# Patient Record
Sex: Female | Born: 1935 | Race: White | Hispanic: No | Marital: Married | Smoking: Former smoker
Health system: Southern US, Community
[De-identification: ages and names within clinical notes are randomized; demographics above are authoritative.]

## PROBLEM LIST (undated history)

## (undated) DIAGNOSIS — I10 Essential (primary) hypertension: Secondary | ICD-10-CM

## (undated) DIAGNOSIS — F0390 Unspecified dementia without behavioral disturbance: Secondary | ICD-10-CM

## (undated) DIAGNOSIS — G309 Alzheimer's disease, unspecified: Secondary | ICD-10-CM

## (undated) DIAGNOSIS — R569 Unspecified convulsions: Secondary | ICD-10-CM

## (undated) DIAGNOSIS — I629 Nontraumatic intracranial hemorrhage, unspecified: Secondary | ICD-10-CM

## (undated) DIAGNOSIS — F0280 Dementia in other diseases classified elsewhere without behavioral disturbance: Secondary | ICD-10-CM

## (undated) DIAGNOSIS — E7800 Pure hypercholesterolemia, unspecified: Secondary | ICD-10-CM

## (undated) DIAGNOSIS — E079 Disorder of thyroid, unspecified: Secondary | ICD-10-CM

## (undated) DIAGNOSIS — K219 Gastro-esophageal reflux disease without esophagitis: Secondary | ICD-10-CM

## (undated) DIAGNOSIS — F419 Anxiety disorder, unspecified: Secondary | ICD-10-CM

## (undated) DIAGNOSIS — E785 Hyperlipidemia, unspecified: Secondary | ICD-10-CM

## (undated) HISTORY — PX: CHOLECYSTECTOMY: SHX55

## (undated) HISTORY — PX: ABDOMINAL HYSTERECTOMY: SHX81

---

## 2000-08-20 ENCOUNTER — Other Ambulatory Visit: Admission: RE | Admit: 2000-08-20 | Discharge: 2000-08-20 | Payer: Self-pay | Admitting: Family Medicine

## 2000-10-08 ENCOUNTER — Encounter (INDEPENDENT_AMBULATORY_CARE_PROVIDER_SITE_OTHER): Payer: Self-pay | Admitting: Specialist

## 2000-10-08 ENCOUNTER — Ambulatory Visit (HOSPITAL_COMMUNITY): Admission: RE | Admit: 2000-10-08 | Discharge: 2000-10-08 | Payer: Self-pay | Admitting: Gastroenterology

## 2002-10-09 ENCOUNTER — Encounter: Payer: Self-pay | Admitting: Gastroenterology

## 2002-10-09 ENCOUNTER — Encounter: Admission: RE | Admit: 2002-10-09 | Discharge: 2002-10-09 | Payer: Self-pay | Admitting: Gastroenterology

## 2003-08-09 ENCOUNTER — Encounter: Admission: RE | Admit: 2003-08-09 | Discharge: 2003-08-16 | Payer: Self-pay | Admitting: Family Medicine

## 2003-08-20 ENCOUNTER — Inpatient Hospital Stay (HOSPITAL_COMMUNITY): Admission: AD | Admit: 2003-08-20 | Discharge: 2003-08-31 | Payer: Self-pay | Admitting: Orthopedic Surgery

## 2003-08-27 ENCOUNTER — Encounter: Payer: Self-pay | Admitting: Orthopedic Surgery

## 2003-09-21 ENCOUNTER — Encounter: Admission: RE | Admit: 2003-09-21 | Discharge: 2003-09-21 | Payer: Self-pay | Admitting: Internal Medicine

## 2003-11-01 ENCOUNTER — Other Ambulatory Visit: Admission: RE | Admit: 2003-11-01 | Discharge: 2003-11-01 | Payer: Self-pay | Admitting: Family Medicine

## 2004-01-19 ENCOUNTER — Ambulatory Visit (HOSPITAL_COMMUNITY): Admission: RE | Admit: 2004-01-19 | Discharge: 2004-01-19 | Payer: Self-pay | Admitting: Family Medicine

## 2004-09-14 ENCOUNTER — Encounter: Admission: RE | Admit: 2004-09-14 | Discharge: 2004-09-14 | Payer: Self-pay | Admitting: Family Medicine

## 2004-09-18 ENCOUNTER — Emergency Department (HOSPITAL_COMMUNITY): Admission: EM | Admit: 2004-09-18 | Discharge: 2004-09-18 | Payer: Self-pay | Admitting: Emergency Medicine

## 2005-02-14 ENCOUNTER — Encounter (INDEPENDENT_AMBULATORY_CARE_PROVIDER_SITE_OTHER): Payer: Self-pay | Admitting: Specialist

## 2005-02-14 ENCOUNTER — Ambulatory Visit (HOSPITAL_COMMUNITY): Admission: RE | Admit: 2005-02-14 | Discharge: 2005-02-14 | Payer: Self-pay | Admitting: Gastroenterology

## 2005-07-09 ENCOUNTER — Ambulatory Visit (HOSPITAL_COMMUNITY): Admission: RE | Admit: 2005-07-09 | Discharge: 2005-07-09 | Payer: Self-pay | Admitting: Family Medicine

## 2006-01-24 ENCOUNTER — Ambulatory Visit (HOSPITAL_COMMUNITY): Admission: RE | Admit: 2006-01-24 | Discharge: 2006-01-24 | Payer: Self-pay | Admitting: Family Medicine

## 2006-01-26 ENCOUNTER — Ambulatory Visit (HOSPITAL_COMMUNITY): Admission: RE | Admit: 2006-01-26 | Discharge: 2006-01-26 | Payer: Self-pay | Admitting: Family Medicine

## 2006-04-17 ENCOUNTER — Encounter: Admission: RE | Admit: 2006-04-17 | Discharge: 2006-04-17 | Payer: Self-pay | Admitting: Neurosurgery

## 2006-05-01 ENCOUNTER — Encounter: Admission: RE | Admit: 2006-05-01 | Discharge: 2006-05-01 | Payer: Self-pay | Admitting: Neurosurgery

## 2006-05-15 ENCOUNTER — Encounter: Admission: RE | Admit: 2006-05-15 | Discharge: 2006-05-15 | Payer: Self-pay | Admitting: Neurosurgery

## 2006-06-20 ENCOUNTER — Ambulatory Visit (HOSPITAL_COMMUNITY): Admission: RE | Admit: 2006-06-20 | Discharge: 2006-06-20 | Payer: Self-pay | Admitting: Neurosurgery

## 2006-07-30 ENCOUNTER — Inpatient Hospital Stay (HOSPITAL_COMMUNITY): Admission: RE | Admit: 2006-07-30 | Discharge: 2006-08-07 | Payer: Self-pay | Admitting: Neurosurgery

## 2006-08-26 ENCOUNTER — Encounter: Admission: RE | Admit: 2006-08-26 | Discharge: 2006-09-30 | Payer: Self-pay | Admitting: Neurosurgery

## 2007-03-31 ENCOUNTER — Encounter: Admission: RE | Admit: 2007-03-31 | Discharge: 2007-04-08 | Payer: Self-pay | Admitting: Unknown Physician Specialty

## 2007-07-17 ENCOUNTER — Ambulatory Visit (HOSPITAL_COMMUNITY): Admission: RE | Admit: 2007-07-17 | Discharge: 2007-07-17 | Payer: Self-pay | Admitting: Family Medicine

## 2007-08-27 ENCOUNTER — Encounter: Admission: RE | Admit: 2007-08-27 | Discharge: 2007-08-27 | Payer: Self-pay | Admitting: Gastroenterology

## 2009-01-08 ENCOUNTER — Inpatient Hospital Stay (HOSPITAL_COMMUNITY): Admission: EM | Admit: 2009-01-08 | Discharge: 2009-01-17 | Payer: Self-pay | Admitting: Family Medicine

## 2009-01-10 ENCOUNTER — Encounter (INDEPENDENT_AMBULATORY_CARE_PROVIDER_SITE_OTHER): Payer: Self-pay | Admitting: Internal Medicine

## 2009-01-10 ENCOUNTER — Ambulatory Visit: Payer: Self-pay | Admitting: *Deleted

## 2009-01-31 ENCOUNTER — Encounter: Admission: RE | Admit: 2009-01-31 | Discharge: 2009-05-01 | Payer: Self-pay | Admitting: Obstetrics and Gynecology

## 2010-08-16 ENCOUNTER — Inpatient Hospital Stay (HOSPITAL_COMMUNITY): Admission: EM | Admit: 2010-08-16 | Discharge: 2010-08-18 | Payer: Self-pay | Admitting: Family Medicine

## 2010-08-17 ENCOUNTER — Ambulatory Visit: Payer: Self-pay | Admitting: Vascular Surgery

## 2010-08-17 ENCOUNTER — Ambulatory Visit: Payer: Self-pay | Admitting: Cardiology

## 2010-08-17 ENCOUNTER — Encounter (INDEPENDENT_AMBULATORY_CARE_PROVIDER_SITE_OTHER): Payer: Self-pay | Admitting: Emergency Medicine

## 2010-08-24 ENCOUNTER — Ambulatory Visit (HOSPITAL_COMMUNITY): Payer: Self-pay | Admitting: Psychology

## 2010-09-01 ENCOUNTER — Ambulatory Visit (HOSPITAL_COMMUNITY): Payer: Self-pay | Admitting: Psychology

## 2010-09-08 ENCOUNTER — Ambulatory Visit (HOSPITAL_COMMUNITY): Payer: Self-pay | Admitting: Psychology

## 2010-09-15 ENCOUNTER — Ambulatory Visit (HOSPITAL_COMMUNITY): Payer: Self-pay | Admitting: Psychiatry

## 2010-09-29 ENCOUNTER — Ambulatory Visit (HOSPITAL_COMMUNITY): Payer: Self-pay | Admitting: Psychology

## 2010-10-13 ENCOUNTER — Ambulatory Visit (HOSPITAL_COMMUNITY): Payer: Self-pay | Admitting: Psychiatry

## 2010-12-19 ENCOUNTER — Encounter: Payer: Self-pay | Admitting: Family Medicine

## 2010-12-19 ENCOUNTER — Ambulatory Visit: Payer: Self-pay | Admitting: Family Medicine

## 2010-12-19 DIAGNOSIS — E039 Hypothyroidism, unspecified: Secondary | ICD-10-CM

## 2010-12-19 DIAGNOSIS — R51 Headache: Secondary | ICD-10-CM

## 2010-12-19 DIAGNOSIS — E119 Type 2 diabetes mellitus without complications: Secondary | ICD-10-CM

## 2010-12-19 DIAGNOSIS — R5383 Other fatigue: Secondary | ICD-10-CM

## 2010-12-19 DIAGNOSIS — R42 Dizziness and giddiness: Secondary | ICD-10-CM

## 2010-12-19 DIAGNOSIS — R5381 Other malaise: Secondary | ICD-10-CM

## 2010-12-19 DIAGNOSIS — R519 Headache, unspecified: Secondary | ICD-10-CM | POA: Insufficient documentation

## 2010-12-19 DIAGNOSIS — F068 Other specified mental disorders due to known physiological condition: Secondary | ICD-10-CM | POA: Insufficient documentation

## 2010-12-19 DIAGNOSIS — I10 Essential (primary) hypertension: Secondary | ICD-10-CM

## 2010-12-19 DIAGNOSIS — F411 Generalized anxiety disorder: Secondary | ICD-10-CM | POA: Insufficient documentation

## 2010-12-19 LAB — CONVERTED CEMR LAB
AST: 28 units/L (ref 0–37)
Alkaline Phosphatase: 84 units/L (ref 39–117)
Bilirubin Urine: NEGATIVE
Blood in Urine, dipstick: NEGATIVE
CO2: 20 meq/L (ref 19–32)
Chloride: 106 meq/L (ref 96–112)
Creatinine, Ser: 0.71 mg/dL (ref 0.40–1.20)
Ketones, urine, test strip: NEGATIVE
Potassium: 4 meq/L (ref 3.5–5.3)
Protein, U semiquant: NEGATIVE
Sodium: 140 meq/L (ref 135–145)
Total Bilirubin: 0.5 mg/dL (ref 0.3–1.2)

## 2010-12-20 ENCOUNTER — Encounter: Payer: Self-pay | Admitting: Family Medicine

## 2010-12-21 ENCOUNTER — Telehealth (INDEPENDENT_AMBULATORY_CARE_PROVIDER_SITE_OTHER): Payer: Self-pay | Admitting: *Deleted

## 2011-01-21 ENCOUNTER — Encounter: Payer: Self-pay | Admitting: Neurosurgery

## 2011-02-01 NOTE — Letter (Signed)
Summary: RELEASE OF MEDICAL RECORDS FORM  RELEASE OF MEDICAL RECORDS FORM   Imported By: Shelbie Proctor 12/20/2010 14:34:53  _____________________________________________________________________  External Attachment:    Type:   Image     Comment:   External Document

## 2011-02-01 NOTE — Assessment & Plan Note (Signed)
Summary: FLU SYMPTOMS/WSE (rm 5)   Vital Signs:  Patient Profile:   76 Years Old Female CC:      HA, fatigue, body aches, runny nose  Height:     61 inches Weight:      142 pounds O2 Sat:      98 % O2 treatment:    Room Air Temp:     98.2 degrees F oral Pulse rate:   73 / minute Resp:     16 per minute BP sitting:   149 / 69  (left arm) Cuff size:   regular  Pt. in pain?   yes    Location:   head    Intensity:   8    Type:       dull  Vitals Entered By: Lajean Saver RN (December 19, 2010 12:24 PM)                   Updated Prior Medication List: OMEPRAZOLE 20 MG CPDR (OMEPRAZOLE) once daily GLIPIZIDE 5 MG XR24H-TAB (GLIPIZIDE)  MECLIZINE HCL 25 MG TABS (MECLIZINE HCL)  LEVOTHYROXINE SODIUM 25 MCG TABS (LEVOTHYROXINE SODIUM)  ALPRAZOLAM 0.25 MG TABS (ALPRAZOLAM)  LISINOPRIL 5 MG TABS (LISINOPRIL)  BYSTOLIC 5 MG TABS (NEBIVOLOL HCL) once daily NAMENDA 10 MG TABS (MEMANTINE HCL)   Current Allergies: ! PCN ! SULFAHistory of Present Illness Chief Complaint: HA, fatigue, body aches, runny nose  History of Present Illness:  Subjective:  Patient presents with her husband.  She complains of fatigue for about 3 weeks, intermittent frontal headache, and frequent mild dizziness occasionally associated with nausea but no vomiting.  She had a sore throat and mild URI symptoms about 2 weeks ago resolved.  She also notes some blurred vision.  She has diabetes but does not remember her last Hgb A1c.  She notes that her urine has been darker lately but no dysuria.  No fevers, chills, and sweats.  No abdominal pain.  No change in bowel movements                                                                                                                                                                                                                                                      REVIEW OF SYSTEMS Constitutional Symptoms       Complains of fatigue.  Denies fever, chills,  night sweats, weight loss, and weight gain.      Comments: body aches Eyes       Denies change in vision, eye pain, eye discharge, glasses, contact lenses, and eye surgery. Ear/Nose/Throat/Mouth       Complains of frequent runny nose.      Denies hearing loss/aids, change in hearing, ear pain, ear discharge, dizziness, frequent nose bleeds, sinus problems, sore throat, hoarseness, and tooth pain or bleeding.  Respiratory       Complains of shortness of breath.      Denies dry cough, productive cough, wheezing, asthma, bronchitis, and emphysema/COPD.  Cardiovascular       Denies murmurs, chest pain, and tires easily with exhertion.    Gastrointestinal       Complains of stomach pain.      Denies nausea/vomiting, diarrhea, constipation, blood in bowel movements, and indigestion. Genitourniary       Denies painful urination, kidney stones, and loss of urinary control. Neurological       Complains of headaches.      Denies paralysis, seizures, and fainting/blackouts. Musculoskeletal       Complains of joint pain.      Denies muscle pain, joint stiffness, decreased range of motion, redness, swelling, muscle weakness, and gout.  Skin       Denies bruising, unusual mles/lumps or sores, and hair/skin or nail changes.  Psych       Denies mood changes, temper/anger issues, anxiety/stress, speech problems, depression, and sleep problems.  Past History:  Past Medical History: Diabetes mellitus, type II Hypertension Hypothyroidism Dementia Anxiety  Past Surgical History: Appendectomy Cholecystectomy Hysterectomy  Social History: Married Never Smoked Alcohol use-no Drug use-no Smoking Status:  never Drug Use:  no   Objective:  Elderly female who appears comfortable and alert. Head:  No tenderness over temporal arteries Eyes:  Pupils are equal, round, and reactive to light and accomdation.  Extraocular movement is intact.  Conjunctivae are not inflamed.  Fundi benign.  No  nystagmus Ears:  Canals normal.  Tympanic membranes normal.   Nose:  Normal septum.  Normal turbinates, mildly congested.    No sinus tenderness present.  Pharynx:  Normal, moist mucous membranes  Neck:  Supple.  No adenopathy is present.  No thyromegaly is present  Lungs:  Clear to auscultation.  Breath sounds are equal.  Heart:  Regular rate and rhythm without murmurs, rubs, or gallops.  Abdomen:  Nontender without masses or hepatosplenomegaly.  Bowel sounds are present.  No CVA or flank tenderness.  Extremities:  No edema.  Pedal pulses are full and equal.       Neurologic:  Cranial nerves 2 through 12 are normal.  CBC:  WBC 5.1; Hgb 14.1 urinalysis (dipstick):  1+ glucose otherwise negative Assessment New Problems: FATIGUE (ICD-780.79) DIZZINESS (ICD-780.4) HEADACHE (ICD-784.0) ANXIETY (ICD-300.00) DEMENTIA (ICD-294.8) HYPOTHYROIDISM (ICD-244.9) HYPERTENSION (ICD-401.9) DIABETES MELLITUS, TYPE II (ICD-250.00)  NO OBVIOUS CAUSE FOR PATIENT'S SYMPTOMS ON EXAM TODAY.  SUSPECT DECREASED OR POOR CONTROL OF DIABETES.  NOTE THAT SHE IS ALREADY TAKIN MECLIZINE REGULARLY  Plan New Orders: CBC w/Diff [[id number]] Urinalysis-dipstick only (Medicare patient) [81003QW] T-Comprehensive Metabolic Panel [80053-22900] T-Sed Rate (Automated) [09811-91478] T-Hgb A1C [83036-23375] New Patient Level IV [99204] Planning Comments:   Check sed rate (doubt temporal arteritis), CMP, and Hgb A1C.   Recommend that she follow-up with her PCP.  Will forward results to her PCP.   The patient and/or caregiver has been counseled thoroughly with regard to medications  prescribed including dosage, schedule, interactions, rationale for use, and possible side effects and they verbalize understanding.  Diagnoses and expected course of recovery discussed and will return if not improved as expected or if the condition worsens. Patient and/or caregiver verbalized understanding.   Orders Added: 1)  CBC w/Diff  [[id number]] 2)  Urinalysis-dipstick only (Medicare patient) [81003QW] 3)  T-Comprehensive Metabolic Panel [80053-22900] 4)  T-Sed Rate (Automated) [[id number]] 5)  T-Hgb A1C [83036-23375] 6)  New Patient Level IV [99204]    Laboratory Results   Urine Tests  Date/Time Received: December 19, 2010 2:07 PM  Date/Time Reported: December 19, 2010 2:07 PM   Routine Urinalysis   Color: yellow Appearance: Clear Glucose: 100   (Normal Range: Negative) Bilirubin: negative   (Normal Range: Negative) Ketone: negative   (Normal Range: Negative) Spec. Gravity: >=1.030   (Normal Range: 1.003-1.035) Blood: negative   (Normal Range: Negative) pH: 5.5   (Normal Range: 5.0-8.0) Protein: negative   (Normal Range: Negative) Urobilinogen: 0.2   (Normal Range: 0-1) Nitrite: negative   (Normal Range: Negative) Leukocyte Esterace: negative   (Normal Range: Negative)

## 2011-02-01 NOTE — Progress Notes (Signed)
  Phone Note Outgoing Call   Call placed by: Clemens Catholic LPN,  December 21, 2010 10:10 AM Call placed to: pts husband Action Taken: Phone Call Completed Summary of Call: call back: called to F/U with pt. pts husband states that she is no better. advised him to have her to F/U with her PCP. pts husband agrees. Initial call taken by: Clemens Catholic LPN,  December 21, 2010 10:11 AM

## 2011-03-16 LAB — CBC
HCT: 38 % (ref 36.0–46.0)
Hemoglobin: 13.4 g/dL (ref 12.0–15.0)
MCH: 30.3 pg (ref 26.0–34.0)
Platelets: 171 10*3/uL (ref 150–400)
RDW: 12.8 % (ref 11.5–15.5)

## 2011-03-16 LAB — CARDIAC PANEL(CRET KIN+CKTOT+MB+TROPI)
CK, MB: 1.5 ng/mL (ref 0.3–4.0)
Relative Index: 1.2 (ref 0.0–2.5)
Relative Index: 1.4 (ref 0.0–2.5)
Total CK: 127 U/L (ref 7–177)
Troponin I: 0.05 ng/mL (ref 0.00–0.06)

## 2011-03-16 LAB — GLUCOSE, CAPILLARY
Glucose-Capillary: 177 mg/dL — ABNORMAL HIGH (ref 70–99)
Glucose-Capillary: 79 mg/dL (ref 70–99)

## 2011-03-16 LAB — MAGNESIUM: Magnesium: 1.7 mg/dL (ref 1.5–2.5)

## 2011-03-16 LAB — URINALYSIS, ROUTINE W REFLEX MICROSCOPIC
Glucose, UA: NEGATIVE mg/dL
pH: 5.5 (ref 5.0–8.0)

## 2011-03-16 LAB — URINE MICROSCOPIC-ADD ON

## 2011-03-16 LAB — BASIC METABOLIC PANEL
BUN: 10 mg/dL (ref 6–23)
BUN: 9 mg/dL (ref 6–23)
CO2: 26 mEq/L (ref 19–32)
CO2: 29 mEq/L (ref 19–32)
Calcium: 9.8 mg/dL (ref 8.4–10.5)
Chloride: 104 mEq/L (ref 96–112)
Creatinine, Ser: 0.62 mg/dL (ref 0.4–1.2)
Creatinine, Ser: 0.66 mg/dL (ref 0.4–1.2)
GFR calc Af Amer: >60 mL/min (ref >60)
Glucose, Bld: 94 mg/dL (ref 70–99)
Glucose, Bld: 99 mg/dL (ref 70–99)
Potassium: 4.5 mEq/L (ref 3.5–5.1)

## 2011-03-16 LAB — POCT CARDIAC MARKERS
CKMB, poc: 1.3 ng/mL (ref 1.0–8.0)
Myoglobin, poc: 47.2 ng/mL (ref 12–200)
Myoglobin, poc: 47.9 ng/mL (ref 12–200)

## 2011-03-16 LAB — LIPID PANEL
Cholesterol: 203 mg/dL — ABNORMAL HIGH (ref 0–200)
HDL: 44 mg/dL (ref >39)
LDL Cholesterol: 94 mg/dL (ref 0–99)
Total CHOL/HDL Ratio: 4.6 RATIO

## 2011-03-16 LAB — DIFFERENTIAL
Basophils Absolute: 0.1 10*3/uL (ref 0.0–0.1)
Basophils Relative: 1 % (ref 0–1)
Eosinophils Absolute: 0.1 10*3/uL (ref 0.0–0.7)
Eosinophils Relative: 1 % (ref 0–5)
Lymphs Abs: 1.7 10*3/uL (ref 0.7–4.0)
Monocytes Absolute: 0.6 10*3/uL (ref 0.1–1.0)
Monocytes Relative: 8 % (ref 3–12)

## 2011-03-16 LAB — HEMOGLOBIN A1C
Hgb A1c MFr Bld: 5.8 % — ABNORMAL HIGH (ref <5.7)
Mean Plasma Glucose: 120 mg/dL — ABNORMAL HIGH (ref <117)

## 2011-03-16 LAB — POCT I-STAT, CHEM 8
Calcium, Ion: 0.93 mmol/L — ABNORMAL LOW (ref 1.12–1.32)
Chloride: 107 mEq/L (ref 96–112)
Creatinine, Ser: 0.5 mg/dL (ref 0.4–1.2)
Potassium: 3.3 mEq/L — ABNORMAL LOW (ref 3.5–5.1)

## 2011-03-16 LAB — CK TOTAL AND CKMB (NOT AT ARMC)
CK, MB: 1.8 ng/mL (ref 0.3–4.0)
Relative Index: INVALID (ref 0.0–2.5)
Total CK: 98 U/L (ref 7–177)

## 2011-04-09 ENCOUNTER — Other Ambulatory Visit (HOSPITAL_COMMUNITY): Payer: Self-pay | Admitting: Pulmonary Disease

## 2011-04-12 ENCOUNTER — Ambulatory Visit (HOSPITAL_COMMUNITY)
Admission: RE | Admit: 2011-04-12 | Discharge: 2011-04-12 | Disposition: A | Discharge status: Home / Self Care | Payer: Medicare Other | Source: Ambulatory Visit | Attending: Pulmonary Disease | Admitting: Pulmonary Disease

## 2011-04-12 ENCOUNTER — Encounter (HOSPITAL_COMMUNITY): Payer: Self-pay

## 2011-04-12 DIAGNOSIS — R51 Headache: Secondary | ICD-10-CM | POA: Insufficient documentation

## 2011-04-12 DIAGNOSIS — H532 Diplopia: Secondary | ICD-10-CM | POA: Insufficient documentation

## 2011-04-12 DIAGNOSIS — R109 Unspecified abdominal pain: Secondary | ICD-10-CM | POA: Insufficient documentation

## 2011-04-12 DIAGNOSIS — G319 Degenerative disease of nervous system, unspecified: Secondary | ICD-10-CM | POA: Insufficient documentation

## 2011-04-16 LAB — BASIC METABOLIC PANEL
BUN: 12 mg/dL (ref 6–23)
BUN: 14 mg/dL (ref 6–23)
BUN: 11 mg/dL (ref 6–23)
BUN: 8 mg/dL (ref 6–23)
CO2: 23 mEq/L (ref 19–32)
CO2: 23 mEq/L (ref 19–32)
CO2: 27 mEq/L (ref 19–32)
CO2: 28 mEq/L (ref 19–32)
Calcium: 9.2 mg/dL (ref 8.4–10.5)
Calcium: 9.5 mg/dL (ref 8.4–10.5)
Calcium: 9.7 mg/dL (ref 8.4–10.5)
Chloride: 101 mEq/L (ref 96–112)
Chloride: 104 mEq/L (ref 96–112)
Chloride: 107 mEq/L (ref 96–112)
Chloride: 99 mEq/L (ref 96–112)
Chloride: 106 mEq/L (ref 96–112)
Creatinine, Ser: 0.75 mg/dL (ref 0.4–1.2)
Creatinine, Ser: 0.71 mg/dL (ref 0.4–1.2)
GFR calc Af Amer: >60 mL/min (ref >60)
GFR calc Af Amer: >60 mL/min (ref >60)
GFR calc Af Amer: >60 mL/min (ref >60)
GFR calc Af Amer: >60 mL/min (ref >60)
GFR calc Af Amer: >60 mL/min (ref >60)
GFR calc non Af Amer: 59 mL/min — ABNORMAL LOW (ref >60)
GFR calc non Af Amer: >60 mL/min (ref >60)
GFR calc non Af Amer: >60 mL/min (ref >60)
GFR calc non Af Amer: >60 mL/min (ref >60)
Glucose, Bld: 121 mg/dL — ABNORMAL HIGH (ref 70–99)
Glucose, Bld: 130 mg/dL — ABNORMAL HIGH (ref 70–99)
Glucose, Bld: 137 mg/dL — ABNORMAL HIGH (ref 70–99)
Glucose, Bld: 116 mg/dL — ABNORMAL HIGH (ref 70–99)
Glucose, Bld: 140 mg/dL — ABNORMAL HIGH (ref 70–99)
Potassium: 3.4 mEq/L — ABNORMAL LOW (ref 3.5–5.1)
Potassium: 3.6 mEq/L (ref 3.5–5.1)
Potassium: 3.7 mEq/L (ref 3.5–5.1)
Potassium: 3.8 mEq/L (ref 3.5–5.1)
Potassium: 3.9 mEq/L (ref 3.5–5.1)
Potassium: 3.9 mEq/L (ref 3.5–5.1)
Potassium: 4.2 mEq/L (ref 3.5–5.1)
Sodium: 137 mEq/L (ref 135–145)
Sodium: 138 mEq/L (ref 135–145)
Sodium: 142 mEq/L (ref 135–145)
Sodium: 142 mEq/L (ref 135–145)
Sodium: 140 mEq/L (ref 135–145)
Sodium: 141 mEq/L (ref 135–145)

## 2011-04-16 LAB — GLUCOSE, CAPILLARY
Glucose-Capillary: 114 mg/dL — ABNORMAL HIGH (ref 70–99)
Glucose-Capillary: 122 mg/dL — ABNORMAL HIGH (ref 70–99)
Glucose-Capillary: 124 mg/dL — ABNORMAL HIGH (ref 70–99)
Glucose-Capillary: 124 mg/dL — ABNORMAL HIGH (ref 70–99)
Glucose-Capillary: 124 mg/dL — ABNORMAL HIGH (ref 70–99)
Glucose-Capillary: 128 mg/dL — ABNORMAL HIGH (ref 70–99)
Glucose-Capillary: 129 mg/dL — ABNORMAL HIGH (ref 70–99)
Glucose-Capillary: 130 mg/dL — ABNORMAL HIGH (ref 70–99)
Glucose-Capillary: 135 mg/dL — ABNORMAL HIGH (ref 70–99)
Glucose-Capillary: 135 mg/dL — ABNORMAL HIGH (ref 70–99)
Glucose-Capillary: 135 mg/dL — ABNORMAL HIGH (ref 70–99)
Glucose-Capillary: 142 mg/dL — ABNORMAL HIGH (ref 70–99)
Glucose-Capillary: 146 mg/dL — ABNORMAL HIGH (ref 70–99)
Glucose-Capillary: 152 mg/dL — ABNORMAL HIGH (ref 70–99)
Glucose-Capillary: 165 mg/dL — ABNORMAL HIGH (ref 70–99)
Glucose-Capillary: 170 mg/dL — ABNORMAL HIGH (ref 70–99)
Glucose-Capillary: 198 mg/dL — ABNORMAL HIGH (ref 70–99)
Glucose-Capillary: 112 mg/dL — ABNORMAL HIGH (ref 70–99)
Glucose-Capillary: 119 mg/dL — ABNORMAL HIGH (ref 70–99)
Glucose-Capillary: 123 mg/dL — ABNORMAL HIGH (ref 70–99)
Glucose-Capillary: 152 mg/dL — ABNORMAL HIGH (ref 70–99)
Glucose-Capillary: 98 mg/dL (ref 70–99)

## 2011-04-16 LAB — CLOSTRIDIUM DIFFICILE EIA: C difficile Toxins A+B, EIA: NEGATIVE

## 2011-04-16 LAB — CSF CELL COUNT WITH DIFFERENTIAL: WBC, CSF: 0 /mm3 (ref 0–5)

## 2011-04-16 LAB — URINALYSIS, ROUTINE W REFLEX MICROSCOPIC
Bilirubin Urine: NEGATIVE
Glucose, UA: NEGATIVE mg/dL
Glucose, UA: NEGATIVE mg/dL
Ketones, ur: 15 mg/dL — AB
Ketones, ur: 15 mg/dL — AB
Protein, ur: NEGATIVE mg/dL
Protein, ur: NEGATIVE mg/dL
Urobilinogen, UA: 0.2 mg/dL (ref 0.0–1.0)

## 2011-04-16 LAB — CSF CULTURE W GRAM STAIN

## 2011-04-16 LAB — PHENYTOIN LEVEL, TOTAL
Phenytoin Lvl: 29.7 ug/mL — ABNORMAL HIGH (ref 10.0–20.0)
Phenytoin Lvl: 9.7 ug/mL — ABNORMAL LOW (ref 10.0–20.0)

## 2011-04-16 LAB — CBC
HCT: 37.4 % (ref 36.0–46.0)
HCT: 40.2 % (ref 36.0–46.0)
HCT: 42.4 % (ref 36.0–46.0)
HCT: 43.6 % (ref 36.0–46.0)
HCT: 46.1 % — ABNORMAL HIGH (ref 36.0–46.0)
HCT: 40 % (ref 36.0–46.0)
HCT: 42.3 % (ref 36.0–46.0)
Hemoglobin: 12.8 g/dL (ref 12.0–15.0)
Hemoglobin: 13.6 g/dL (ref 12.0–15.0)
Hemoglobin: 14.1 g/dL (ref 12.0–15.0)
Hemoglobin: 15.3 g/dL — ABNORMAL HIGH (ref 12.0–15.0)
Hemoglobin: 13.3 g/dL (ref 12.0–15.0)
Hemoglobin: 13.7 g/dL (ref 12.0–15.0)
Hemoglobin: 13.9 g/dL (ref 12.0–15.0)
MCHC: 33.3 g/dL (ref 30.0–36.0)
MCHC: 33.8 g/dL (ref 30.0–36.0)
MCHC: 34.2 g/dL (ref 30.0–36.0)
MCHC: 33.2 g/dL (ref 30.0–36.0)
MCHC: 33.3 g/dL (ref 30.0–36.0)
MCV: 89.8 fL (ref 78.0–100.0)
MCV: 90.7 fL (ref 78.0–100.0)
MCV: 90.7 fL (ref 78.0–100.0)
MCV: 90.9 fL (ref 78.0–100.0)
MCV: 91.2 fL (ref 78.0–100.0)
MCV: 91 fL (ref 78.0–100.0)
Platelets: 176 10*3/uL (ref 150–400)
Platelets: 185 10*3/uL (ref 150–400)
Platelets: 195 10*3/uL (ref 150–400)
Platelets: 186 10*3/uL (ref 150–400)
Platelets: 187 10*3/uL (ref 150–400)
RBC: 4.14 MIL/uL (ref 3.87–5.11)
RBC: 4.47 MIL/uL (ref 3.87–5.11)
RBC: 4.65 MIL/uL (ref 3.87–5.11)
RBC: 4.9 MIL/uL (ref 3.87–5.11)
RBC: 4.97 MIL/uL (ref 3.87–5.11)
RBC: 5.07 MIL/uL (ref 3.87–5.11)
RBC: 4.4 MIL/uL (ref 3.87–5.11)
RBC: 4.62 MIL/uL (ref 3.87–5.11)
RDW: 12.9 % (ref 11.5–15.5)
RDW: 12.9 % (ref 11.5–15.5)
RDW: 12.9 % (ref 11.5–15.5)
RDW: 13.1 % (ref 11.5–15.5)
WBC: 5 10*3/uL (ref 4.0–10.5)
WBC: 5.6 10*3/uL (ref 4.0–10.5)
WBC: 5.9 10*3/uL (ref 4.0–10.5)
WBC: 6.3 10*3/uL (ref 4.0–10.5)
WBC: 8.3 10*3/uL (ref 4.0–10.5)
WBC: 4.5 10*3/uL (ref 4.0–10.5)

## 2011-04-16 LAB — CARDIAC PANEL(CRET KIN+CKTOT+MB+TROPI)
CK, MB: 1.3 ng/mL (ref 0.3–4.0)
CK, MB: 1.5 ng/mL (ref 0.3–4.0)
Relative Index: INVALID (ref 0.0–2.5)
Total CK: 44 U/L (ref 7–177)
Total CK: 57 U/L (ref 7–177)
Troponin I: 0.03 ng/mL (ref 0.00–0.06)

## 2011-04-16 LAB — GRAM STAIN

## 2011-04-16 LAB — COMPREHENSIVE METABOLIC PANEL
ALT: 38 U/L — ABNORMAL HIGH (ref 0–35)
AST: 29 U/L (ref 0–37)
AST: 39 U/L — ABNORMAL HIGH (ref 0–37)
BUN: 13 mg/dL (ref 6–23)
CO2: 21 mEq/L (ref 19–32)
CO2: 25 mEq/L (ref 19–32)
Calcium: 10 mg/dL (ref 8.4–10.5)
Calcium: 9.8 mg/dL (ref 8.4–10.5)
Creatinine, Ser: 1 mg/dL (ref 0.4–1.2)
GFR calc Af Amer: >60 mL/min (ref >60)
GFR calc Af Amer: >60 mL/min (ref >60)
GFR calc non Af Amer: 55 mL/min — ABNORMAL LOW (ref >60)
GFR calc non Af Amer: >60 mL/min (ref >60)
Sodium: 141 mEq/L (ref 135–145)

## 2011-04-16 LAB — URINE CULTURE: Colony Count: 60000

## 2011-04-16 LAB — DIFFERENTIAL
Eosinophils Absolute: 0 10*3/uL (ref 0.0–0.7)
Eosinophils Relative: 0 % (ref 0–5)
Lymphs Abs: 1.1 10*3/uL (ref 0.7–4.0)
Monocytes Relative: 5 % (ref 3–12)

## 2011-04-16 LAB — MAGNESIUM: Magnesium: 1.9 mg/dL (ref 1.5–2.5)

## 2011-04-16 LAB — FECAL LACTOFERRIN, QUANT: Fecal Lactoferrin: NEGATIVE

## 2011-04-16 LAB — HEMOCCULT GUIAC POC 1CARD (OFFICE): Fecal Occult Bld: NEGATIVE

## 2011-04-16 LAB — URINE MICROSCOPIC-ADD ON

## 2011-04-16 LAB — CALCIUM: Calcium: 8.7 mg/dL (ref 8.4–10.5)

## 2011-04-16 LAB — HEMOGLOBIN A1C: Mean Plasma Glucose: 134 mg/dL

## 2011-04-16 LAB — CK TOTAL AND CKMB (NOT AT ARMC): Total CK: 47 U/L (ref 7–177)

## 2011-04-16 LAB — APTT: aPTT: 33 seconds (ref 24–37)

## 2011-05-15 NOTE — Procedures (Signed)
EEG NUMBER:  [id number]   HISTORY:  This is a 76 year old patient who is admitted for syncope,  recurrent seizures, unresponsive episode, shaking of the extremities has  a history of head injury.  The patient is being evaluated for the above.   MEDICATIONS:  Norvasc, Lotensin, hydrochlorothiazide, levothyroxine,  Namenda, Protonix, Dilantin, Zoloft, Tylenol, Apresoline, and Zofran.  This is a portable EEG recording.  No skull defects were noted.   EEG CLASSIFICATION:  Delta grade 1, generalized; dysrhythmia grade 1,  generalized.   DESCRIPTION OF RECORDING:  Background rhythm of this recording consists  of a fairly well-modulated 9 Hz background activity that is reactive to  eye opening and closure.  As the record progresses, the most notable  features of the recording include an intermittent episodes of  generalized theta and delta slowing seen intermittently throughout the  recording.  Occasionally, these episodes are associated with movement  but at times they are not.  The generalized slowing is symmetric from  one hemisphere to the next.  Photic stimulation and hyperventilation  were not performed.  At no time during the recording did there appear to  be evidence of spikes, spike wave discharges, or evidence of focal  slowing.  EKG monitor shows no evidence of cardiac rhythm abnormalities  with a heart rate of 78.   IMPRESSION:  This is an abnormal EEG recording due to episodes of  intermittent slowing.  Such study may be associated with dysfunction in  the deep midline nuclei.  No clear epileptiform discharges were seen.  The patient never clearly enters stage II sleep at any time.      Marlan Palau, M.D.  Electronically Signed     GNF:AOZH  D:  01/11/2009 19:11:23  T:  01/12/2009 04:40:34  Job #:  [id number]

## 2011-05-15 NOTE — Consult Note (Signed)
NAMESAYLOR, [name]              ACCOUNT NO.:  [id number]   MEDICAL RECORD NO.:  [id number]          PATIENT TYPE:  INP   LOCATION:  3112                         FACILITY:  [facility]   PHYSICIAN:  Noel Christmas, MD    DATE OF BIRTH:  1935-07-30   DATE OF CONSULTATION:  DATE OF DISCHARGE:                                 CONSULTATION   REASON FOR CONSULTATION:  Recurrent seizures.   HISTORY:  This is a 76 year old lady who presented to emergency room at  [facility] and subsequently was transferred to [facility]  Emergency Room for further evaluation.  Husband described an episode of  her sitting on the side of her bed followed by becoming unresponsive and  having shaking of her extremities followed by confusion.  On evaluation  in the emergency room at [facility], a CT scan was obtained which  showed possible frontal subarachnoid hemorrhage.  She subsequently was  sent to [facility] for further evaluation because MRI study was  unavailable.  She continued to have similar spells of becoming  unresponsive and having shaking of her extremities lasting from 1-2  minutes typically.  She had several spells after arriving at [facility].  A lumbar puncture was obtained, which showed findings  consistent with probable traumatic spinal tap.  There was no evidence of  meningitis.  CSF protein was normal.  Glucose was slightly elevated.  The patient has a history of diabetes mellitus.  MRI was obtained this  morning, which showed bilateral areas of probable subarachnoid  hemorrhage consistent with findings seen on CT scan yesterday.  The  patient gives a history of recent head trauma, having fallen onto a  bookcase hitting her head.  There is also history of multiple falls  including the one that occurred in late 2006.  She had an MRI of her  brain in January 2007, which showed findings indicative of frontal lobe  contusion involving the splenium of corpus callosum.  A  repeat study in  June 2007, showed no significant change.  This area, however, did show  improvement on today's MRI study.  MRA showed no signs of intracranial  aneurysm and no significant intracranial vascular changes other than  mild atherosclerotic changes.  Laboratory studies were unremarkable  including CBC and serum electrolytes including calcium, magnesium, and  phosphorus.  Blood glucose on admission was 131.  The patient's last  spell occurred around 7 o'clock this morning.  Nursing staff witnessed  sudden onset of blank stare followed by eyes deviating upward and the  patient having extension of her neck and tonic-clonic-type shaking of  her arms and legs for about 2 minutes.  This was followed by several  minutes of unresponsiveness and confusion on awakening.  She had been  given fosphenytoin 1 g intravenously last night followed by 100 mg of  Dilantin p.o. q.8 h.  The Dilantin level this morning was 9.7.   PAST MEDICAL HISTORY:  Remarkable for mild dementia, hypertension,  diabetes mellitus, and hypothyroidism.   MEDICATIONS:  1. Prilosec 20 mg per day.  2. Glipizide extended release 5  mg per day.  3. Amlodipine 5 mg per day.  4. Namenda 10 mg per day.  5. Synthroid 25 mcg per day.  6. Bystolic 5 mg daily.  7. Zoloft 25 mg per day.  8. Benazepril/hydrochlorothiazide 1 daily.  9. Metformin 1000 mg b.i.d.  10.Micro __________ daily.  11.Mirtazapine 30 mg one-half pack nightly.  12.Hydrocodone p.r.n. pain.  13.Meclizine p.r.n. dizziness.  14.Vitamin D once a week.   FAMILY HISTORY:  Noncontributory.   PHYSICAL EXAMINATION:  GENERAL:  Appearance was that of an elderly lady  of medium built who is slightly overweight.  She was alert and  cooperative in no acute distress.  She was oriented to place as well as  date and approximately oriented to correct date of the week.  She  remembered only 1-3 words after 3 minutes.  Longterm memory was  considerably better.  Affect  was appropriate.  HEENT:  Pupils were equal and reacted normally to light.  Extraocular  movements were full and conjugate.  Visual fields were intact and  normal.  There was no facial weakness.  Hearing was normal.  Speech and  palate movement were normal.  NEUROLOGIC:  Coordination of extremities was normal.  Strength and  muscle tone were normal throughout.  Deep tendon reflexes were normal  and symmetrical including 1+ ankle reflexes.  She had mild frontal  release signs.  She had bilateral Babinski sign.  Sensory exam was  normal.  Carotid auscultation revealed no bruits.   CLINICAL IMPRESSION:  1. New onset generalized seizures, most likely associated with frontal      lobe findings of subarachnoid hemorrhage and probably small      bilateral frontal contusions due to recent head trauma as described      above.  2. Old contusion involving splenium of the corpus callosum with slight      residual abnormality on MRI.  3. Mild chronic dementia.   RECOMMENDATIONS:  1. Agree with management in intensive care unit due to recurrent      multiple generalized seizure, uncontrolled at this point.  2. We will continue Dilantin including addition of fosphenytoin IV 500      mg followed by continued Dilantin maintenance dose of 100 mg q.8 h.      We will also continue monitor to Dilantin levels.  3. EEG in the a.m.  4. Vitamin B12 and folate levels.  5. RPR.  6. TSH level.  7. Physical therapy evaluation recommendations regarding patient's      gait and frequent falls.   Thank you for asking me to evaluate Ms. Streater.      Noel Christmas, MD  Electronically Signed     CS/MEDQ  D:  01/09/2009  T:  01/10/2009  Job:  [id number]

## 2011-05-15 NOTE — Discharge Summary (Signed)
[name], [name]              ACCOUNT NO.:  [id number]   MEDICAL RECORD NO.:  [id number]          PATIENT TYPE:  INP   LOCATION:  6709                         FACILITY:  [facility]   PHYSICIAN:  Lonia Blood, M.D.DATE OF BIRTH:  04-Nov-1935   DATE OF ADMISSION:  01/08/2009  DATE OF DISCHARGE:  01/17/2009                               DISCHARGE SUMMARY   PRIMARY CARE PHYSICIAN:  Unassigned to the [place] area.   DISCHARGE DIAGNOSES:  1. Refractory post traumatic seizure disorder.      a.     Now well controlled.      b.     Anti-epileptic medication required.  2. Bilateral anterior frontal lobe region post traumatic subarachnoid      hemorrhages - small.  3. Vertigo of unclear etiology with gait instability and failure to      thrive - vertigo much improved with ongoing requirement for      physical therapy.  4. Diabetes mellitus.  5. Alzheimer's type dementia.  6. Hypertension.  7. Hypothyroidism.  8. Status post hysterectomy in 1975.  9. Status post cholecystectomy in 2000.  10.Status post L2-L3 decompression fusion.  11.ALLERGIES TO PENICILLIN AND SULFA.   DISCHARGE MEDICATIONS:  1. Protonix 40 mg p.o. daily.  2. Norvasc 5 mg p.o. daily.  3. Namenda 10 mg p.o. daily  4. Synthroid 25 mcg p.o. daily.  5. Bystolic 5 mg p.o. daily.  6. Zoloft 25 mg p.o. daily.  7. Lotensin 5 mg p.o. daily.  8. Dilantin 1000 mg p.o. q.8h.  9. Meclizine 25 mg p.o. q.8h. p.r.n. dizziness.  10.Tylenol 650 mg p.o. q.4h. p.r.n.  11.Glucophage 1000 mg p.o. b.i.d.   FOLLOW UP:  Ongoing care will be provided by the attending of record at  the patient's nursing home of choice.  CBG should be followed closely  with probable need for further titration of diabetes medications.  Bowel  habits should be followed closely with further evaluation for diarrhea  as indicated.   CONSULTATIONS:  [facility].   PROCEDURES:  1. Lumbar puncture on January 08, 2009 - findings  consistent with      intracranial hemorrhage but no evidence to suggest meningitis.  2. MRI and MRA of the brain on January 10, 2008 - within a single      sulcus of the anterior and right frontal lobes bilaterally, there      is altered signal intensity on FLAIR sequence corresponding to the      CT abnormality.  Exact etiology is indeterminate.  Features raise      the possibility of small amount of hemorrhage related to a trauma.      Interval improvement in appearance of splenium of the corpus      callosum.  No acute infarcts.  Motion degraded MRA revealing only      mild to moderate intracranial atherosclerotic type changes.  3. CT scan of abdomen and pelvis on January 12, 2009 - severe fatty      infiltrate of the liver which is stable.  A stable left adrenal      nodule,  likely adenoma.  No acute findings of the abdomen or      pelvis.  4. Transthoracic echocardiogram on January 10, 2009 - LV systolic      function normal.  Ejection fraction 65-70%.  5. Bilateral carotid Dopplers on January 10, 1999 - vertebral flow      antegrade.  No significant right or left ICA stenosis.  6. EEG on January 11, 2009 - abnormal EEG recording due to episodes of      intermittent slowing.  This study may be associated with      dysfunction in the deep midline nuclei.  No clear epileptiform      discharges were seen.   HOSPITAL COURSE:  [name] is a very pleasant 76 year old  female with moderate dementia who was transferred to [facility]  on January 08, 2009 from [facility] after she experienced multiple  seizures there.  She reported a minimum of a 33-month history of  instability on her feet with dizziness and multiple episodes of falling.  She recalled specifically an episode weeks prior to her admission where  she struck her head on a bookshelf.  Full evaluation revealed evidence  of a small bilateral frontal lobe area  subarachnoid hemorrhage.  Full  evaluation for  possible syncope or cerebrovascular disease was carried  out and was unrevealing.  A spinal tap was carried out and was  consistent with intracranial hemorrhage and not suggestive of  meningitis.  EEG was carried out and was essentially nondiagnostic.  Full metabolic workup was carried out and was unrevealing.  The ultimate  decision was made that the patient was likely suffering multiple  episodes seizure due to her subarachnoid hemorrhage/closed head injury.  She is to continue her current antiepileptics.  She can schedule follow  up with [facility] in the outpatient setting once  she is able to be discharged from her rehab facility.   During the course the patient's hospital stay, she also began to  experience difficulty with loose bowel movements/loose stools.  Given  her inpatient status, there was concern that she had contracted  Clostridium difficile colitis.  No less than 4 Clostridium difficile  toxin studies were carried out, and all were negative.  Imodium was  therefore administered.  The patient tolerated this well with  improvement but without complete resolution of her symptoms.  At the  present time, fecal lactoferrin is pending.  It is not felt, however,  likely that the patient is suffering with a severe infectious colitis.  The patient's loose stools could in fact be related to dietary changes  om the hospital.  We will follow this closely, and this should be  monitored in the skilled nursing facility, as well.   After stabilization medically, the patient was evaluated by physical  therapy and occupational therapy.  It was not felt the patient was safe  to ambulate freely about her home or to live independently due to her  significant deficits in balance, gait and mental capacity.  After  discussion with the patient and her family, the decision was made that  skilled nursing facility placement for rehab would be most appropriate.  Case management  worked with the patient and family and was able to  arrange for a bed at [facility] rehab facility.  At the present time, we  are simply waiting bed availability for the patient to be transferred to  that facility.   The underlying event which appeared to have led  to the patient's  multiple episodes of falling is vertigo leading to gait instability.  As  noted above, MRI and MRA of the brain failed to reveal any acute  abnormalities.  Echocardiogram failed to reveal any acute abnormalities.  Bilateral carotid Dopplers were accomplished and also confirmed adequate  cerebral blood flow.  Scopolamine and meclizine were administered during  the hospital stay.  At the present time, the patient reports that her  vertigo has completely resolved.  It is quite possible this is a  transient episode of labyrinthitis.  The patient will need to be  followed on a serial basis to assure that her vertigo has not resumed.   The patient's diabetic medications were discontinued during her hospital  stay initially due to the need for multiple contrasted studies.  She was  maintained on sliding scale insulin.  At the present time, we are  resuming the patient's Glucophage, and her CBG should be monitored  closely in the outpatient setting to assure that her diabetes remains  well controlled.  She will continue on an ACE inhibitor but aspirin is  being held for the obvious reason of her recent subarachnoid hemorrhage.      Lonia Blood, M.D.  Electronically Signed     JTM/MEDQ  D:  01/16/2009  T:  01/16/2009  Job:  [id number]

## 2011-05-15 NOTE — H&P (Signed)
NAMEARMONII, Tara [name]              ACCOUNT NO.:  [id number]   MEDICAL RECORD NO.:  [id number]          PATIENT TYPE:  INP   LOCATION:  4741                         FACILITY:  [facility]   PHYSICIAN:  Lamar Laundry, MD      DATE OF BIRTH:  [birth year]   DATE OF ADMISSION:  01/08/2009  DATE OF DISCHARGE:                              HISTORY & PHYSICAL   CHIEF COMPLAINT:  Dizziness and syncope.   HISTORY OF PRESENT ILLNESS:  This is a 76 year old female with a history  of diabetes mellitus and high blood pressure who was evaluated at [facility] ER after initially presenting to [facility] earlier  today with complaints of a syncopal episode at home and vertigo.  History is obtained by the patient and her daughter.  Early this morning  the patient woke up and felt as if she could not talk and could not move  normally.  She was exceptionally tired and fatigued.  She got out of bed  and swung her legs over the bed and all of a sudden went down on her  knees.  She states that her husband tried to hold her up, but he was not  able to.  She also reports being somewhat shaky and tremulous at that  time.  She states that she possibly lost consciousness and there is one  episode of vomiting that was nonbloody and nonbilious that was reported  around this incident.  Following this, she was subsequently taken to  [facility] by ambulance.  At [facility], they  got a head CT which was questionable for a possible subarachnoid  hemorrhage.  While there, she had at least five episodes in which both  her arms and her legs were shaking, and it appeared that she was not  acutely alert.  She did not have any loss of bowel or bladder function  during these episodes.  These episodes lasted about two minutes before  abating.  The patient does not remember any of this, but the story is  told by her daughter.  She received some Ativan at [facility] which helped  with these episodes.  Subsequently, she was  brought over to [facility] where a lumbar puncture was  conducted.  The lumbar puncture was performed and in the initial tube  there were 1115 RBCs, but by the fourth tube there were only 184 RBCs,  and in the fourth tube the fluid was colorless and clear, consistent  more with a traumatic tap rather than a subarachnoid hemorrhage.  She  had one episode while she was here when she was going to the commode in  which she similarly had a shaking episode lasting about 2 minutes that  her daughter describes as really more looking like contractures, which  subsequently resolved when she laid down.  She states that she was  usually nauseous after rather than before these episodes.  The patient  also states that she has noticed headaches in the last few months, but  it was not particularly worse today.  She reports that  she hit her head  on a bookshelf a few weeks ago cleaning the house, but did not  experience any loss of consciousness related to this, and she did not  believe that that acutely worsened any of her low-level headaches.  She  has described significant vertigo, but no tinnitus.  She feels that her  balance has been off for months.  She denies any fevers or chills, chest  pain, or shortness of breath.  She denies having any significant  palpitations.  In the past, she has made mistakes in taking her  antihypertensives as she does have a mild to moderate dementia.  She  does have a pill box at home, and her husband helps her to take her  medications.  The family denies that there was a medication error last  night or this morning.  Her blood sugars were adequate during these  episodes as well.   ALLERGIES:  PENICILLIN, SULFA.   MEDICATIONS:  1. Omeprazole 20 mg p.o. daily.  2. Glipizide extended release 5 mg p.o. daily.  3. Amlodipine 5 mg p.o. daily.  4. Namenda 10 mg p.o. daily.  5. Synthroid 25 mcg p.o. daily.  6.  Bystolic 5 mg p.o. daily.  7. Sertraline 25 mg p.o. daily.  8. Benazepril/hydrochlorothiazide tab p.o. daily.  9. Metformin 1000 mg p.o. b.Tara [name].d.  10.Micro colestipol p.o. daily.  11.Mirtazapine 30 mg one-half pack p.o. q.h.s.  12.Hydrocodone p.o. p.r.n. pain.  13.p.o. p.r.n. diarrhea.  14.Meclizine p.o. p.r.n. dizziness.  15.Vitamin D p.o. each Sunday.   PAST MEDICAL HISTORY:  1. Diabetes mellitus.  2. Hypertension.  3. Dementia.  4. Hypothyroidism.   PAST SURGICAL HISTORY:  1. Status post hysterectomy in 1975.  2. Status post cholecystectomy in 2000.  3. Status post a relatively recent L2-L3 decompression and fusion.   SOCIAL HISTORY:  [place] her husband.  She walks with the aid of a cane.  She is able  to do most ADLs and IADLs without assistance.  She is not currently  driving because of her dementia.  She is a former 20 pack year smoking  history who quit about 10 years ago.  She denies the use of alcohol.  She denies the use of illicit drugs.   FAMILY HISTORY:  There is no history of seizures in the family.  Her  mother however did have a benign brain tumor.  Her dad and her sister  have diabetes mellitus.   REVIEW OF SYSTEMS:  A 10-point review of systems was performed, only  significant for the vertigo, syncope described in the HPI.  All other  systems are negative.   PHYSICAL EXAMINATION:  VITAL SIGNS:  Temperature 98, blood pressure  141/77, pulse 85, respirations 18, 97% on room air.  GENERAL:  This is an elderly Caucasian female in no acute distress.  She  is awake, alert, oriented x3 at present and behaving, conversing  appropriately.  NECK:  No carotid bruits were auscultated.  No JVD.  CARDIOVASCULAR:  Some regular rate and rhythm.  S1-S2.  No murmurs were  heard.  LUNGS:  Clear to auscultation bilaterally.  No crackles.  ABDOMEN:  Soft, nontender, nondistended.  Normoactive bowel sounds.   EXTREMITIES:  No pretibial edema.  SKIN:  No rashes.  NEUROLOGICAL:  Strength and sensory function intact grossly bilaterally.   LABORATORY DATA:  WBC 5.9, hemoglobin 14.1, hematocrit 42.4, platelets  215.  Sodium 142, potassium 3.9, chloride  107, bicarb 24, BUN 10,  creatinine 0.72, glucose 131, INR 1.0.  Results of her lumbar puncture  previously described.  An EKG reveals normal sinus rhythm with no acute  ST or T-wave abnormalities.  No abnormalities were appreciated on  telemetry thus far.   ASSESSMENT AND PLAN:  A 76 year old female with episodes of vertigo,  possible syncope, and possible seizure-like activity.   PROBLEMS:  1. Syncope, possible seizures, and vertigo.  The LP performed at our      facility seems to be consistent with a traumatic tap rather than a      subarachnoid hemorrhage.  We will have the Radiology Department      review of the head CT from the outside facility.  We will cycle the      patient's cardiac enzymes.  In meantime, monitor on telemetry.      Will check a morning EKG.  We will also check carotid Dopplers.  We      will order a transthoracic echocardiogram.  We will have a neuro      consult to evaluate the possibility of seizures.  We will check a      TSH to evaluate her thyroid function.  Will also check a urinalysis      as UTIs can cause diffuse symptoms in elderly patients.  2. Diabetes mellitus.  We will hold the patient's oral hypoglycemics      and place her on insulin sliding scale while she is in the      hospital.  3. Hypothyroidism.  We will continue her outpatient Synthroid and      check a TSH.  4. Hypertension.  We will continue her outpatient antihypertensives.  5. Prophylaxis.  We will place the patient on SCDs as we will avoid      heparin or Lovenox at present while there is any question      whatsoever of a possible intracranial bleed.   CODE STATUS:  The patient wishes to be a full code.      Lamar Laundry, MD   Electronically Signed     HR/MEDQ  D:  01/08/2009  T:  01/09/2009  Job:  [id number]

## 2011-05-18 NOTE — Discharge Summary (Signed)
[name], [name]                        ACCOUNT NO.:  [id number]   MEDICAL RECORD NO.:  [id number]                   PATIENT TYPE:  INP   LOCATION:  0466                                 FACILITY:  [facility]   PHYSICIAN:  [name]. [name], M.D.             DATE OF BIRTH:  January 01, 1935   DATE OF ADMISSION:  08/20/2003  DATE OF DISCHARGE:  08/31/2003                                 DISCHARGE SUMMARY   ADMISSION DIAGNOSES:  1. Infected prepatellar bursa secondary to trauma right knee.  2. Hypertension.  3. Hypercholesterolemia.  4. Anxiety.  5. Depression.  6. Hypothyroidism.   DISCHARGE DIAGNOSES:  1. Incision and drainage of prepatellar bursa right knee.  2. Hypertension.  3. Hypercholesterolemia.  4. Anxiety.  5. Depression.  6. Hypothyroidism.   PROCEDURE:  The patient was taken to the operating room on August 26, 2003  to undergo incision and drainage of her prepatellar bursa right knee.  Surgeon [name]. [name], M.D.  Assistant [name], P.A.  Surgery  was performed under general anesthesia.   CONSULTS:  Infectious disease.   BRIEF HISTORY:  This patient is a 76 year old female who presented to the  office with right knee pain for two to three weeks, worse over the past few  days.  She had sustained a fall approximately three weeks prior to her knee  pain sustaining a small abrasion to her right knee two to three days prior  to coming into the office.  She experienced increasing pain, swelling, and  redness.  She was also running a low grade fever.  After being evaluated in  the office it was felt that she should be admitted to [facility]  for IV antibiotics and possible incision and drainage of prepatellar bursa  and the patient was subsequently admitted to [facility] for same.   LABORATORY DATA:  Pre admission CBC:  WBC 18.0, elevated, RBC 4.55,  hemoglobin 14.4, hematocrit 41.4, platelet count 229,000.  ESR 32, elevated.  Admission  chemistry:  Sodium 135, potassium 4.4, chloride 103, CO2 26,  glucose 368, elevated, BUN 16, creatinine 1.0.  Calcium 8.7, total protein  6.8, albumin 3.4, slightly low.  CRP 153.1, elevated.  Wound culture:  Moderate methicillin resistant Staphylococcus aureus.  Anaerobic wound  culture:  No organisms isolated.  Admission EKG:  Normal sinus rhythm at a  rate of 99.   HOSPITAL COURSE:  The patient was admitted to [facility] on August 20, 2003 for IV antibiotics for a prepatellar bursitis right knee.  The  patient also was given continuous warm moist heat along with IV antibiotics.  Her abscess localized.  It was felt that she could benefit from I&D of her  patellar bursa.  She was taken to the operating room on August 26, 2003 for  the above stated procedure.  She tolerated the procedure well.  Was allowed  to return  to the recovery room and then to the orthopedic floor to continue  her postoperative care.  Infectious disease was consulted.  Wound cultures  grew out MRSA and she was started on the appropriate IV antibiotics.  The  patient slowly improved and was allowed to be discharged home on August 30, 2003.   DISCHARGE MEDICATIONS:  1. Vicodin 5 mg one to two q.6h. as needed for pain.  2. Levaquin 500 mg one daily.  3. Percocet one to two q.6h. as needed for pain.   DIET:  As tolerated.   ACTIVITY:  Full weightbearing with walker.   WOUND CARE:  Keep covered.  Continue to use heating pad.   FOLLOWUP:  The patient is to follow up with Dr. Orvan Lloyd one to two weeks.  She should call his office to schedule appointment.  The patient should also  follow up with [name], M.D. one week in the office.  She should  call our office to schedule an appointment.   CONDITION ON DISCHARGE:  Improved.     [name], P.A.                     [name], M.D.    [name]  D:  09/29/2003  T:  09/29/2003  Job:  [id number]

## 2011-05-18 NOTE — Op Note (Signed)
[name], RAPE NO.:  [id number]   MEDICAL RECORD NO.:  [id number]          PATIENT TYPE:  INP   LOCATION:  3303                         FACILITY:  [facility]   PHYSICIAN:  Payton Doughty, M.D.      DATE OF BIRTH:  06/10/1935   DATE OF PROCEDURE:  07/30/2006  DATE OF DISCHARGE:                                 OPERATIVE REPORT   SURGEON:  Payton Doughty, M.D.   NURSE ASSISTANT:  [place].   DOCTOR ASSISTANT:  Clydene Fake, M.D.   PREOPERATIVE DIAGNOSIS:  Spondylosis at L2-3.   POSTOPERATIVE DIAGNOSIS:  Spondylosis at L2-3.   OPERATIVE PROCEDURE:  L2-3 laminectomy, diskectomy, posterior lumbar  interbody fusion with Ray Threaded Fusion Cage, posterolateral nonsegmental  pedicle-screw fixation, and posterolateral arthrodesis.   ANESTHESIA:  General endotracheal.   PREPARATION:  Prep was done with preoperative scrub and alcohol wipe.   COMPLICATIONS:  None.   INDICATIONS FOR PROCEDURE:  The patient is a 76 year old lady with severe  spondylosis at L2-3.   DESCRIPTION OF PROCEDURE:  Taken to the operating room, smoothly  anesthetized and intubated, placed prone on the operating room table.  Following shave, prep and drape in the usual sterile fashion, the skin was  infiltrated with 1% lidocaine with 1:400,000 epinephrine.  Then, the skin  was incised from the bottom of L1 to the middle of L3.  The lamina of L2, as  well as the transverse processes of L2 and L3 were exposed bilaterally in a  subperiosteal plane.  Intraoperative x-ray confirmed correct level.  The  pars interarticularis, lamina and inferior facet of L2 and the superior  facet of L3 were removed bilaterally using a high-speed drill.  This allowed  removal of ligamentum flavum and decompression of both the L2 and L3 roots  as they traversed this area.  The right side was slightly more affected than  the left.  Following complete decompression, diskectomy was carried out and  Ray Threaded Fusion  Cages, 12 x 21 mm, were placed.  They were packed with  bone harvested from the facet joints.  The pedicles were then probed and  pedicle screws were placed using the standard landmarks.  Intraoperative x-  ray switched good placement of pedicle screws.  Following placement of  pedicle screws, rods were connected and locked down.  The transverse  processes were decorticated, and LP1 on the extender matrix was placed  across them.  Intraoperative x-ray showed good placement of cages, pedicle screws, rods  and caps.  The incision was closed in successive layers of 0 Vicryl, 2-0  Vicryl and 3-0 nylon.  Betadine Telfa dressings were applied and made  occlusive with OpSite and the patient returned to the recovery room in good  condition.           ______________________________  Payton Doughty, M.D.     MWR/MEDQ  D:  07/30/2006  T:  07/31/2006  Job:  [id number]

## 2011-05-18 NOTE — Op Note (Signed)
NAMEMEGGIE, LASETER              ACCOUNT NO.:  [id number]   MEDICAL RECORD NO.:  [id number]          PATIENT TYPE:  AMB   LOCATION:  ENDO                         FACILITY:  [facility]   PHYSICIAN:  Petra Kuba, M.D.    DATE OF BIRTH:  1935/04/21   DATE OF PROCEDURE:  02/14/2005  DATE OF DISCHARGE:                                 OPERATIVE REPORT   PROCEDURE:  Colonoscopy with polypectomy.   ENDOSCOPIST:  Petra Kuba, M.D.   INDICATION:  History of colon polyps.  Consent was signed after risks,  benefits, methods, and options were thoroughly discussed multiple times in  the past.   MEDICINES USED:  Demerol 70, Versed 7.   DESCRIPTION OF PROCEDURE:  Rectal inspection was pertinent for external  hemorrhoids, small.  Digital exam was negative.  The pediatric video  adjustable colonoscope was inserted and fairly easily advanced around the  colon to the cecum.  This did require rolling her on her back and some  abdominal pressure.  No obvious abnormality was seen on insertion.  The  cecum was identified by the appendiceal orifice and the ileocecal valve.  The scope was slowly withdrawn.  The prep was adequate.  It did require some  washing and suctioning for adequate visualization.  In the midascending, a  tiny polyp was seen and was hot biopsied x1.  No other abnormalities were  seen as we slowly withdrawn back to the rectum.  Anorectal pull through in  retroflexion confirmed some small hemorrhoids.  The scope was straightened  and re-advanced a short ways up the left side of the colon.  Air was  suctioned and the scope removed.  The patient tolerated the procedure well.  There was no obvious immediate complication.   ENDOSCOPIC DIAGNOSES:  1.  Internal and external hemorrhoids.  2.  Ascending tiny polyp, hot biopsied.  3.  Otherwise within normal limits to the cecum.   PLAN:  Await pathology, would recheck colon screening in five years.  Happy  to see back p.r.n., otherwise  return care to the [facility] doctors for  the customary healthcare maintenance to include yearly rectals and guaiacs.      MEM/MEDQ  D:  02/14/2005  T:  02/14/2005  Job:  [id number]   cc:   Saul Fordyce, N.P.  [facility]

## 2011-05-18 NOTE — Discharge Summary (Addendum)
[name], [name]              ACCOUNT NO.:  [id number]   MEDICAL RECORD NO.:  [id number]          PATIENT TYPE:  INP   LOCATION:  3037                         FACILITY:  [facility]   PHYSICIAN:  Payton Doughty, M.D.      DATE OF BIRTH:  Feb 01, 1935   DATE OF ADMISSION:  07/30/2006  DATE OF DISCHARGE:  08/07/2006                                 DISCHARGE SUMMARY   ADMITTING DIAGNOSIS:  Spondylosis L2-3.   DISCHARGE DIAGNOSIS:  Same.   OPERATIVE PROCEDURE:  L2-3 LAMINECTOMY, DISKECTOMY, POSTEROLATERAL____ QA                                                           MARKER: 12____ FUSION, PEDICLE SCREWS AND POSTEROLATERAL ARTHRODESIS.    SERVICE:  General surgery.   COMPLICATIONS:  None.   DISCHARGE STATUS:  ____ QA MARKER: 18 ____ .  Th  whose History and Physical is on the chart.  She has basically had  spondylosis for a number of years.  She has an MRI that shows tight stenosis  at 2-3 with spondylolisthesis.  She was admitting after I ascertained normal  laboratory values, and underwent fusion.  Postoperatively, she has done  well.  She was anemic for a couple of days.  She ____ QA MARKER: 42 ____        the floor, and has been undergoing physical therapy to get herself  ambulatory.  Currently, she is ambulatory with a walker.  Pain is controlled  with oral Percocet.  Her incision is dry and well healing.  Her strength is  full.  She is being discharged home to the care of her family with home PT  arranged.  Her followup will be in the [facility]  office in about a week for sutures.           ______________________________  Payton Doughty, M.D.     MWR/MEDQ  D:  08/07/2006  T:  08/07/2006  Job:  [id number]

## 2011-05-18 NOTE — H&P (Signed)
[name], [name]                          ACCOUNT NO.:  [id number]   MEDICAL RECORD NO.:  [id number]                   PATIENT TYPE:   LOCATION:                                       FACILITY:   PHYSICIAN:  ATTENDING NOT GIVEN                 DATE OF BIRTH:   DATE OF ADMISSION:  DATE OF DISCHARGE:                                HISTORY & PHYSICAL   HISTORY:  The patient presented to our office today with right knee pain  over the past two to three weeks, worse over the past few days.  She fell  approximately three weeks ago sustaining a small abrasion to her knee.  Over  the past few days she has noticed increased pain, swelling, and redness.  Has also been running a low grade fever.  After she was evaluated in the  office today it was felt that she should be admitted for the hospital for IV  antibiotics.   ALLERGIES:  PENICILLIN causes breathing problem and SULFA causes a rash.   PAST MEDICAL HISTORY:  1. Hypertension.  2. Hypercholesterolemia.  3. Anxiety.  4. Depression.  5. Hypothyroidism.   CURRENT MEDICATIONS:  1. Nexium 40 mg daily.  2. Librax 25 mg daily.  3. Hyoscyamine 0.375 mg daily.  4. Evista 60 mg daily.  5. Celexa 20 mg b.i.d.  6. Zetia 10 mg daily.  7. Crestor 5 mg daily.  8. HCTZ 25 mg daily.  9. Synthroid 25 mcg daily.  10.      Amiloride 5 mg daily.  11.      Zosyn 160 mg daily.  12.      Klonopin 0.5 mg h.s.  13.      Xanax 0.25 mg b.i.d.   FAMILY HISTORY:  Not available.   REVIEW OF SYSTEMS:  GENERAL:  Denies weight change, fatigue, or chills.  She  has had low grade temperature.  HEENT:  Denies headache, visual changes,  tinnitus, hearing loss, sore throat.  CARDIOVASCULAR:  Denies chest pain,  palpitations, shortness of breath, orthopnea.  PULMONARY:  Denies dyspnea,  wheezing, cough, sputum production, hemoptysis.  GASTROINTESTINAL:  Denies  dysphagia, nausea, vomiting, hematemesis, or abdominal pain.  GENITOURINARY:  Denies dysuria,  frequency, urgency, hematuria.  ENDOCRINE:  Denies polyuria,  polydipsia, appetite change, heat or cold intolerance.  MUSCULOSKELETAL:  Does have a lot of pain, swelling, and stiffness of right knee.  NEUROLOGIC:  Denies dizziness, vertigo, syncope, seizures.  SKIN:  Does have erythematous  and warmth to the right knee.   PHYSICAL EXAMINATION:  VITAL SIGNS:  Temperature 99.5, pulse 80,  respirations 18, blood pressure 130/80 right arm sitting.  GENERAL:  A 76 year old female no acute distress.  HEENT:  PERRL.  EOMs intact.  Pharynx clear.  TMs intact.  NECK:  Supple without masses.  CHEST:  Clear to auscultation bilaterally.  No wheezes, rales, or rhonchi  noted.  HEART:  Regular rate and rhythm without murmur, rub, or gallop.  ABDOMEN:  Positive bowel sounds, soft, nontender.  No organomegaly, abnormal  masses.  EXTREMITIES:  The patient does have erythematous, warm right knee.  She has  a small abrasion over her patella.  Decreased range of motion due to pain.   IMPRESSION:  Pre patellar bursitis, cellulitis right knee.   PLAN:  The patient is to be admitted to [facility] for IV  antibiotics.     [name]. Paitsel, P.A.                     ATTENDING NOT GIVEN    LKP/MEDQ  D:  08/20/2003  T:  08/20/2003  Job:  [id number]

## 2011-05-18 NOTE — Op Note (Signed)
   NAMEERCEL, NORMOYLE                        ACCOUNT NO.:  [id number]   MEDICAL RECORD NO.:  [id number]                   PATIENT TYPE:  INP   LOCATION:  0466                                 FACILITY[facility]  [facility]   PHYSICIAN:  Georges Lynch. Darrelyn Hillock, M.D.             DATE OF BIRTH:  June 06, 1935   DATE OF PROCEDURE:  08/26/2003  DATE OF DISCHARGE:                                 OPERATIVE REPORT   SURGEON:  Georges Lynch. Darrelyn Hillock, M.D.   ASSISTANT:  Ebbie Ridge. Paitsel, P.A.-C.   PREOPERATIVE DIAGNOSIS:  Infected prepatellar bursa secondary to trauma,  right knee.   POSTOPERATIVE DIAGNOSIS:  Infected prepatellar bursa secondary to trauma,  right knee.   HISTORY:  She came into the office last Friday on August 20, for an  evaluation of an injury she sustained 10 days prior to that when she had a  superficial laceration on her knee from trauma.  Basically what happened,  she developed a generalized severe cellulitis so we elected to admit her,  start her on vancomycin and heat pads with hopes to localize this to a one  specific area which we did.  She developed a localized infection in the  prepatellar bursa.   OPERATION/PROCEDURE:  Incision and drainage of prepatellar bursa, right  knee.   DESCRIPTION OF PROCEDURE:  Under general anesthesia, routine orthopedic prep  and draping of the right lower extremity was carried out.  A transverse  incision was made over the anterior aspect of the right knee.  Immediately  upon entering the bursa, there was a large amount of purulent material that  was extruded from the wound.  At this time cultures and sensitivities were  taken for aerobic and anaerobic.  I then utilized a gloved finger and went  up proximally, distally, medially, and laterally and broke up all the  adhesions and pus pockets, and thoroughly irrigated out the area with  antibiotic solution.  We had good bleeding tissue.  Once the irrigation was  completed, we packed the wound open with  Iodoform gauze and sterile  dressings were applied.  She will be placed back on her vancomycin until we  see the final cultures.                                                Ronald A. Darrelyn Hillock, M.D.    RAG/MEDQ  D:  08/26/2003  T:  08/26/2003  Job:  [id number]

## 2011-05-18 NOTE — Procedures (Signed)
[facility]  Patient:    [name], [name]                     MRN: [id number] Proc. Date: 10/08/00 Adm. Date:  [id number] Attending:  Nelda Marseille CC:         Dr. Jefferson Fuel, Medicine   Procedure Report  PROCEDURE:  Colonoscopy with polypectomy.  INDICATIONS FOR PROCEDURE:  A patient with multiple continual GI symptoms, history of colon polyps due for repeat screening. Consent was signed after risks, benefits, methods, and options were thoroughly discussed on multiple occasions.  MEDICINES USED:  Demerol 70, Versed 7.  DESCRIPTION OF PROCEDURE:  Rectal inspection was pertinent for external hemorrhoids. Digital exam was negative. The video pediatric colonoscope was inserted and fairly easily advanced around the colon to the cecum. This did require rolling her on her back and some abdominal pressure. The cecum was identified by the appendiceal orifice and the ileocecal valve. No significant abnormality was seen on insertion. A 10 mm polyp was seen which was cold biopsied x 2. The scope was inserted a short ways into the terminal ileum which was normal. Photo documentation was obtained and the scope was slowly withdrawn. Two folds above the cecum in the ascending colon, a 4 mm polyp was seen, snared, electrocautery applied and the polyp was suctioned through the scope and collected in the trap. The scope was further withdrawn. The remainder of the ascending, transverse and descending was normal. The sigmoid was tortuous, slightly edematous from passing the scope. There were a few hyperplastic appearing sigmoid polyps which were hot biopsied and back in the rectum there were multiple hyperplastic appearing polyps which were also hot biopsied. Once back in the rectum, the scope was retroflexed pertinent for some internal hemorrhoids. The scope was straightened, air was withdrawn, the scope removed. The patient tolerated the procedure well and there was  no obvious or immediate complication.  ENDOSCOPIC DIAGNOSIS: 1. Internal/external hemorrhoids. 2. Tortuous sigmoid. 3. Multiple hyperplastic appearing rectal distal sigmoid polyps hot biopsied. 4. Cecal tiny 2 mm polyp cold biopsied. 5. Ascending 4 mm polyp status post snare. 6. Otherwise within normal limits to the terminal ileum.  PLAN:  Await pathology but probably recheck colon in 4 years. Customary 2 week post polypectomy instructions. GI follow-up p.r.n. or in 6-8 weeks to recheck symptoms and see if an ultrasound is needed. She is not sure if she had that, and will check the chart. Also okay for her to liberalize antacid to use in addition to her Nexium for her periodic mid epigastric pain and call me sooner p.r.n. DD:  10/08/00 TD:  10/09/00 Job: [id number]

## 2011-05-18 NOTE — H&P (Signed)
[name], [name]              ACCOUNT NO.:  [id number]   MEDICAL RECORD NO.:  [id number]          PATIENT TYPE:  INP   LOCATION:  2899                         FACILITY:  [facility]   PHYSICIAN:  Payton Doughty, M.D.      DATE OF BIRTH:  10-09-1935   DATE OF ADMISSION:  07/30/2006  DATE OF DISCHARGE:                                HISTORY & PHYSICAL   ADMITTING DIAGNOSIS:  Spondylosis L2-3.   A very nice now 76 year old right-handed white lady I saw in April for some  brain abnormalities that are stable and probably incidental findings.  She  has also been having a lot of pain in her back and down her lower  extremities.  She has an MR that shows very tight stenosis at L2-3 and she  is admitted now for decompression and fusion at that level.  Her medical  history is remarkable for a bit of depression, hypertension, dementia.   MEDICATIONS:  Levothyroxine, amiloride, hyoscyamine, hydrochlorothiazide,  Evista, meloxicam, Diovan, Wellbutrin, Nexium, Vytorin, Namenda, Lexapro,  Centrum Silver, Cozaar, aspirin a day.   SURGICAL HISTORY:  1.  Hysterectomy in 1975.  2.  Cholecystectomy in 2000.   ALLERGIES:  She is allergic to PENICILLIN and SULFA as they give her nausea  and not rash.   SOCIAL HISTORY:  She does not smoke.  Drinks once or twice a month.  Is  retired.   FAMILY HISTORY:  Both parents are deceased.  Mother had dementia at 7.  Daddy died of diabetic complications.  She has sister with diabetes.   REVIEW OF SYSTEMS:  Remarkable for her injuries, hearing loss, balance  disturbance, mouth sores, hypertension, hypercholesterolemia, leg weakness,  back pain, arthritis, difficulty with remembering, anxiety.   PHYSICAL EXAMINATION:  HEENT:  Within normal limits.  NECK:  She has reasonable range of motion in her neck.  CHEST:  Clear.  CARDIAC:  Regular rate and rhythm without murmur.  BACK:  Nontender.  NEUROLOGIC:  She is awake, alert and oriented x3.  Her pupils equal,  round,  react to light.  Extraocular movements are intact.  Facial movement and  sensation are intact.  Tongue protrudes in the midline.  Shoulder shrug is  normal.  She has no swallowing difficulties.  She does have significant  hearing loss in the right ear.  Motor examination shows 5/5 strength with no  pronator drift.  Her strength is 5/5 in the upper and lower extremities.  She cannot finger-nose point with the eyes closed on the right side.  Left  is all right.   MR of the lumbar spine demonstrates significant spondylytic disease at L2-3.  She also has some significant dementia.   CLINICAL IMPRESSION:  Lumbar spondylosis and dementia.  She really cannot  walk, is incapacitated with back and leg pain.  She is admitted now for  lumbar fusion at L2-3.  The risks and benefits have been discussed with her  and she wished to proceed.           ______________________________  Payton Doughty, M.D.     MWR/MEDQ  D:  07/30/2006  T:  07/30/2006  Job:  [id number]

## 2011-09-24 ENCOUNTER — Emergency Department (HOSPITAL_COMMUNITY): Payer: Medicare Other

## 2011-09-24 ENCOUNTER — Encounter (HOSPITAL_COMMUNITY): Payer: Self-pay | Admitting: Emergency Medicine

## 2011-09-24 ENCOUNTER — Emergency Department (HOSPITAL_COMMUNITY)
Admission: EM | Admit: 2011-09-24 | Discharge: 2011-09-24 | Disposition: A | Discharge status: Home / Self Care | Payer: Medicare Other | Attending: Emergency Medicine | Admitting: Emergency Medicine

## 2011-09-24 DIAGNOSIS — E7800 Pure hypercholesterolemia, unspecified: Secondary | ICD-10-CM | POA: Insufficient documentation

## 2011-09-24 DIAGNOSIS — I10 Essential (primary) hypertension: Secondary | ICD-10-CM | POA: Insufficient documentation

## 2011-09-24 DIAGNOSIS — Z87891 Personal history of nicotine dependence: Secondary | ICD-10-CM | POA: Insufficient documentation

## 2011-09-24 DIAGNOSIS — R109 Unspecified abdominal pain: Secondary | ICD-10-CM | POA: Insufficient documentation

## 2011-09-24 DIAGNOSIS — Z7982 Long term (current) use of aspirin: Secondary | ICD-10-CM | POA: Insufficient documentation

## 2011-09-24 DIAGNOSIS — R51 Headache: Secondary | ICD-10-CM | POA: Insufficient documentation

## 2011-09-24 DIAGNOSIS — F411 Generalized anxiety disorder: Secondary | ICD-10-CM | POA: Insufficient documentation

## 2011-09-24 DIAGNOSIS — F0390 Unspecified dementia without behavioral disturbance: Secondary | ICD-10-CM | POA: Insufficient documentation

## 2011-09-24 DIAGNOSIS — E119 Type 2 diabetes mellitus without complications: Secondary | ICD-10-CM | POA: Insufficient documentation

## 2011-09-24 HISTORY — DX: Pure hypercholesterolemia, unspecified: E78.00

## 2011-09-24 HISTORY — DX: Essential (primary) hypertension: I10

## 2011-09-24 HISTORY — DX: Unspecified dementia, unspecified severity, without behavioral disturbance, psychotic disturbance, mood disturbance, and anxiety: F03.90

## 2011-09-24 HISTORY — DX: Anxiety disorder, unspecified: F41.9

## 2011-09-24 LAB — CBC
Hemoglobin: 14.8 g/dL (ref 12.0–15.0)
MCH: 30.3 pg (ref 26.0–34.0)
MCHC: 35.5 g/dL (ref 30.0–36.0)
RDW: 12.3 % (ref 11.5–15.5)

## 2011-09-24 LAB — COMPREHENSIVE METABOLIC PANEL
Albumin: 4 g/dL (ref 3.5–5.2)
BUN: 12 mg/dL (ref 6–23)
Creatinine, Ser: 0.69 mg/dL (ref 0.50–1.10)
Potassium: 3.8 mEq/L (ref 3.5–5.1)
Total Protein: 7.2 g/dL (ref 6.0–8.3)

## 2011-09-24 LAB — DIFFERENTIAL
Basophils Relative: 1 % (ref 0–1)
Eosinophils Absolute: 0.2 10*3/uL (ref 0.0–0.7)
Monocytes Absolute: 0.7 10*3/uL (ref 0.1–1.0)
Monocytes Relative: 10 % (ref 3–12)
Neutrophils Relative %: 58 % (ref 43–77)

## 2011-09-24 LAB — LIPASE, BLOOD: Lipase: 29 U/L (ref 11–59)

## 2011-09-24 MED ORDER — OXYCODONE-ACETAMINOPHEN 5-325 MG PO TABS: 1.0000 | ORAL_TABLET | Freq: Four times a day (QID) | ORAL | Status: AC | PRN

## 2011-09-24 MED ORDER — METOCLOPRAMIDE HCL 5 MG/ML IJ SOLN
5.0000 mg | Freq: Once | INTRAMUSCULAR | Status: AC
  Administered 2011-09-24: 10 mg via INTRAVENOUS
  Filled 2011-09-24: qty 2

## 2011-09-24 MED ORDER — KETOROLAC TROMETHAMINE 30 MG/ML IJ SOLN
15.0000 mg | Freq: Once | INTRAMUSCULAR | Status: AC
  Administered 2011-09-24: 30 mg via INTRAVENOUS
  Filled 2011-09-24: qty 1

## 2011-09-24 MED ORDER — ONDANSETRON HCL 4 MG/2ML IJ SOLN
4.0000 mg | Freq: Once | INTRAMUSCULAR | Status: AC
  Administered 2011-09-24: 4 mg via INTRAVENOUS
  Filled 2011-09-24: qty 2

## 2011-09-24 MED ORDER — HYDROMORPHONE HCL 1 MG/ML IJ SOLN
1.0000 mg | Freq: Once | INTRAMUSCULAR | Status: AC
  Administered 2011-09-24: 1 mg via INTRAVENOUS
  Filled 2011-09-24: qty 1

## 2011-09-24 MED ORDER — SODIUM CHLORIDE 0.9 % IV SOLN
Freq: Once | INTRAVENOUS | Status: AC
  Administered 2011-09-24: 15:00:00 via INTRAVENOUS

## 2011-09-24 NOTE — Discharge Instructions (Signed)
Follow up with your md this week for recheck  °

## 2011-09-24 NOTE — ED Provider Notes (Signed)
History     CSN: [id number] Arrival date & time: 09/24/2011  1:34 PM  Chief Complaint  Patient presents with  . Abdominal Pain  . Headache    HPI  (Consider location/radiation/quality/duration/timing/severity/associated sxs/prior treatment)  Patient is a 76 y.o. female presenting with abdominal pain and headaches. The history is provided by the patient (Patient complains of headache moderate in intensity patient was sent over for evaluation from her doctor's office).  Abdominal Pain The primary symptoms of the illness include abdominal pain. The primary symptoms of the illness do not include fatigue or diarrhea. The current episode started 2 days ago. The onset of the illness was gradual. The problem has been gradually improving.  The patient states that she believes she is currently not pregnant. Symptoms associated with the illness do not include chills, anorexia, diaphoresis, hematuria, frequency or back pain. Significant associated medical issues do not include PUD or HIV.  Headache  Pertinent negatives include no anorexia.    Past Medical History  Diagnosis Date  . Diabetes mellitus   . Hypertension   . Dementia   . Anxiety   . Hypercholesteremia     Past Surgical History  Procedure Date  . Abdominal hysterectomy   . Cholecystectomy     History reviewed. No pertinent family history.  History  Substance Use Topics  . Smoking status: Former Games developer  . Smokeless tobacco: Not on file  . Alcohol Use: No    OB History    Grav Para Term Preterm Abortions TAB SAB Ect Mult Living                  Review of Systems  Review of Systems  Constitutional: Negative for chills, diaphoresis and fatigue.  HENT: Negative for congestion, sinus pressure and ear discharge.   Eyes: Negative for discharge.  Respiratory: Negative for cough.   Cardiovascular: Negative for chest pain.  Gastrointestinal: Positive for abdominal pain. Negative for diarrhea and anorexia.    Genitourinary: Negative for frequency and hematuria.  Musculoskeletal: Negative for back pain.  Skin: Negative for rash.  Neurological: Positive for headaches. Negative for seizures.  Hematological: Negative.   Psychiatric/Behavioral: Negative for hallucinations.    Allergies  Penicillins; Phenergan; and Sulfonamide derivatives  Home Medications   Current Outpatient Rx  Name Route Sig Dispense Refill  . ACETAMINOPHEN 500 MG PO TABS Oral Take 500-1,000 mg by mouth every 6 (six) hours as needed. Pain     . ASPIRIN-ACETAMINOPHEN-CAFFEINE 250-250-65 MG PO TABS Oral Take 1 tablet by mouth every 6 (six) hours as needed. Headache     . GLIPIZIDE 5 MG PO TB24 Oral Take 5 mg by mouth daily.      [name] LISINOPRIL 5 MG PO TABS Oral Take 5 mg by mouth daily.      [name] MEMANTINE HCL 10 MG PO TABS Oral Take 10 mg by mouth 2 (two) times daily.      . NEBIVOLOL HCL 5 MG PO TABS Oral Take 5 mg by mouth daily.      [name] OMEPRAZOLE 20 MG PO CPDR Oral Take 20 mg by mouth daily.      . SERTRALINE HCL 50 MG PO TABS Oral Take 50 mg by mouth daily.      . OXYCODONE-ACETAMINOPHEN 5-325 MG PO TABS Oral Take 1 tablet by mouth every 6 (six) hours as needed for pain. 15 tablet 0    Physical Exam    BP 140/98  Pulse 69  Temp(Src) 97.4 F (36.3 C) (Oral)  Resp 18  Ht 5\' 2"  (1.575 m)  Wt 148 lb (67.132 kg)  BMI 27.07 kg/m2  SpO2 99%  Physical Exam  Constitutional: She is oriented to person, place, and time. She appears well-developed.  HENT:  Head: Normocephalic and atraumatic.  Eyes: Conjunctivae and EOM are normal. No scleral icterus.  Neck: Neck supple. No thyromegaly present.  Cardiovascular: Normal rate and regular rhythm.  Exam reveals no gallop and no friction rub.   No murmur heard. Pulmonary/Chest: No stridor. She has no wheezes. She has no rales. She exhibits no tenderness.  Abdominal: She exhibits no distension. There is no tenderness. There is no rebound.  Musculoskeletal: Normal range of motion.  She exhibits no edema.  Lymphadenopathy:    She has no cervical adenopathy.  Neurological: She is oriented to person, place, and time. Coordination normal.  Skin: No rash noted. No erythema.  Psychiatric: She has a normal mood and affect. Her behavior is normal.    ED Course  Procedures (including critical care time)    Pt improved with tx  Labs Reviewed  COMPREHENSIVE METABOLIC PANEL - Abnormal; Notable for the following:    Glucose, Bld 143 (*)    Calcium 10.6 (*)    AST 44 (*)    ALT 47 (*)    All other components within normal limits  CBC  DIFFERENTIAL  LIPASE, BLOOD   Ct Head Wo Contrast  09/24/2011  *RADIOLOGY REPORT*  Clinical Data: Weakness, headache, confusion  CT HEAD WITHOUT CONTRAST  Technique:  Contiguous axial images were obtained from the base of the skull through the vertex without contrast.  Comparison: 04/12/2011  Findings: No skull fracture is noted.  Paranasal sinuses and mastoid air cells are unremarkable.  No intracranial hemorrhage, mass effect or midline shift.  No acute infarction.  No mass lesion is noted on this unenhanced scan. Stable cerebral atrophy.  Stable chronic white matter disease.  IMPRESSION: No acute intracranial abnormality.  Stable atrophy and chronic white matter disease.  Original Report Authenticated By: Natasha Mead, M.D.   Dg Abd Acute W/chest  09/24/2011  *RADIOLOGY REPORT*  Clinical Data: Pain.  ACUTE ABDOMEN SERIES (ABDOMEN 2 VIEW & CHEST 1 VIEW)  Comparison: None.  Findings: Trachea is midline.  Heart size normal.  Lungs are low in volume but clear.  No pleural fluid.  Two views of the abdomen show gas and stool scattered in the colon, as well as a few fluid levels.  No small bowel dilatation.  IMPRESSION: Bowel gas pattern is somewhat nonspecific.  No overt obstruction.  Original Report Authenticated By: Reyes Ivan, M.D.     1. Headache      MDM Headache,  Stress headache,         [name], MD 09/24/11 [phone]

## 2011-09-24 NOTE — ED Notes (Signed)
Pt c/o intermittant headaches x 2-3 months. Seen dr Juanetta Gosling and gave rx meds without releif. Did not contact [facility] again. Pt c/o RLQ and middle lower abd pain intermittant x 3 weeks. Last bm normal yesterday. Denies black or bloody stools. Denies urinary sx's. Pain to abd only with movement. Denies abd pain with lying down. Pt is alert/oriented. nad at this time.

## 2011-11-20 ENCOUNTER — Other Ambulatory Visit (HOSPITAL_COMMUNITY): Payer: Self-pay | Admitting: Pulmonary Disease

## 2011-11-20 DIAGNOSIS — Z139 Encounter for screening, unspecified: Secondary | ICD-10-CM

## 2011-11-29 ENCOUNTER — Ambulatory Visit (HOSPITAL_COMMUNITY)
Admission: RE | Admit: 2011-11-29 | Discharge: 2011-11-29 | Disposition: A | Discharge status: Home / Self Care | Payer: Medicare Other | Source: Ambulatory Visit | Attending: Pulmonary Disease | Admitting: Pulmonary Disease

## 2011-11-29 DIAGNOSIS — Z780 Asymptomatic menopausal state: Secondary | ICD-10-CM | POA: Insufficient documentation

## 2011-11-29 DIAGNOSIS — Z13820 Encounter for screening for osteoporosis: Secondary | ICD-10-CM | POA: Insufficient documentation

## 2011-11-29 DIAGNOSIS — M818 Other osteoporosis without current pathological fracture: Secondary | ICD-10-CM | POA: Insufficient documentation

## 2011-11-29 DIAGNOSIS — Z139 Encounter for screening, unspecified: Secondary | ICD-10-CM

## 2011-12-14 ENCOUNTER — Other Ambulatory Visit (HOSPITAL_COMMUNITY): Payer: Self-pay | Admitting: Psychiatry

## 2012-11-10 ENCOUNTER — Ambulatory Visit (HOSPITAL_COMMUNITY): Payer: Medicare Other | Admitting: Psychology

## 2013-02-09 ENCOUNTER — Encounter (HOSPITAL_COMMUNITY): Payer: Self-pay | Admitting: Psychology

## 2013-02-09 ENCOUNTER — Ambulatory Visit (INDEPENDENT_AMBULATORY_CARE_PROVIDER_SITE_OTHER): Payer: Medicare Other | Admitting: Psychology

## 2013-02-09 DIAGNOSIS — I609 Nontraumatic subarachnoid hemorrhage, unspecified: Secondary | ICD-10-CM

## 2013-02-09 DIAGNOSIS — F068 Other specified mental disorders due to known physiological condition: Secondary | ICD-10-CM

## 2013-02-09 DIAGNOSIS — F0630 Mood disorder due to known physiological condition, unspecified: Secondary | ICD-10-CM

## 2013-02-09 NOTE — Progress Notes (Addendum)
Patient:   [name]   DOB:   02/13/1935  MR Number:  [id number]  Location:  [facility] [address] [place] [address] Dept: [phone]           Date of Service:   02/09/2013  Start Time:   2 PM End Time:   3 PM  Provider/Observer:  Hershal Coria PSYD       Billing Code/Service: [phone]  Chief Complaint:     Chief Complaint  Patient presents with  . Memory Loss  . Stress  . Anxiety  . Agitation    Reason for Service:  The patient was brought back in by her husband after the patient is described as repeatedly wanting to have someone give her a second opinion regarding her living situation and her cognitive difficulties. The patient has a long history of cognitive deterioration and goes back at least 7 or 8 years. The patient's husband has described in the past steady decline in her functioning. She then developed a subarachnoid hemorrhage and subsequent seizure disorder. The seizures are apparently under control. Severe depression and anxiety also been quite clear. I saw the patient for one visit back in August of 2011 but she did not followup for testing. I have also seen the patient's husband in the past following this to discuss some of the ongoing problems with his wife. She is now in a new nursing home and as been there for several months but continues to insist that she should be able to come home. However, she does display some significant cognitive difficulties including severe memory problems with confabulation and confusion.     Current Status:   the patient came in today and clearly displayed significant mental status deficits including short-term memory, deficits with regard to orientation to time and place, and confusion about historical and current issues. The patient was extremely confused about what year it was at various times stated that he was either 78, 2020, or  2006. She thought it was January effectiveness February not able to give the day of the month. When asked about who the President of the [place] was she first stated Kyung Rudd been stated [employer] .she also had some recollection about concerns about the current president but was not able to name him and then again started listing presents from the past.   Reliability of Information:  the information was provided by the patient, her husband, and medical records regarding her medications.   Behavioral Observation: [name]  presents as a 78 y.o.-year-old Right Caucasian Female who appeared her stated age. her dress was Appropriate and she was Well Groomed and her manners were generally appropriate to the situation but she was clearly confused about the situation.  There were not any physical disabilities noted.  she displayed an appropriate level of cooperation and motivation.    Interactions:    Active   Attention:   Severe deficits were noted with regard to attention and concentration and she is clearly preoccupied with inner thoughts and preoccupations.  Memory:   Severe memory deficits were noted and there were clear indications of confabulation his particularly with issues where she was trying to remember things that had emotional memories to them and she would fill in the blanks relative to those emotional feelings rather than to actual facts and fruits.  Visuo-spatial:   within normal limits  Speech (Volume):  normal  Speech:   normal  pitch and normal volume  Thought Process:  Circumstantial  Though Content:  Rumination  Orientation:   person and She showed severe deficits with regard to time and place. She continues in the past primary care doctor that she had an gave answers to issues such as what year it was ranging from  2006-2020.  Judgment:   Poor  Planning:   Poor  Affect:    Anxious  Mood:    Depressed  Insight:   Lacking  Intelligence:   normal   Medical History:   Past Medical History  Diagnosis Date  . Diabetes mellitus   . Hypertension   . Dementia   . Anxiety   . Hypercholesteremia         Outpatient Encounter Prescriptions as of 02/09/2013  Medication Sig Dispense Refill  . acetaminophen (TYLENOL) 500 MG tablet Take 500-1,000 mg by mouth every 6 (six) hours as needed. Pain       . ALPRAZolam (XANAX) 0.25 MG tablet Take 0.25 mg by mouth at bedtime as needed.        [name] aspirin-acetaminophen-caffeine (EXCEDRIN EXTRA STRENGTH) 250-250-65 MG per tablet Take 1 tablet by mouth every 6 (six) hours as needed. Headache       . glipiZIDE (GLUCOTROL XL) 5 MG 24 hr tablet Take 5 mg by mouth daily.        [name] lisinopril (PRINIVIL,ZESTRIL) 5 MG tablet Take 5 mg by mouth daily.        . memantine (NAMENDA) 10 MG tablet Take 10 mg by mouth 2 (two) times daily.        . nebivolol (BYSTOLIC) 5 MG tablet Take 5 mg by mouth daily.        [name] omeprazole (PRILOSEC) 20 MG capsule Take 20 mg by mouth daily.        . sertraline (ZOLOFT) 50 MG tablet Take 50 mg by mouth daily.         No facility-administered encounter medications on file as of 02/09/2013.          Sexual History:   History  Sexual Activity  . Sexually Active:     Family Med/Psych History: No family history on file.  Risk of Suicide/Violence: low  the patient in the reporting feelings of suicidal or homicidal ideation   Impression/DX:   the patient clearly has a confusing complex diagnostic picture. She was showing some significant cognitive declines is reported by her husband that would've been consistent with Alzheimer's types of symptoms. However, she had a subarachnoid hemorrhage in 2010 with significant cognitive deficits from that. She has shown some progressive decline even after she recovered from  thi subarachnoid hemorrhage to  some degree it is clear that she continues to experience some significant memory deficits and executive functioning deficits.   Disposition/Plan:  At this point, the primary reason why the patient was brought in her husband had to do with her repeat desires to be able to. However, cognitive sclera and severe and given the question being asked to really does not need any further neuropsychological testing. It is clear during the clinical interview the patient is not have the capacity to live at home and her husband has had a recent hip fracture and now severe knee problems and clearly is not in a situation where he be capable of taking care of her with her significant cognitive deficits. No other family members are available for this either in her placement in a long-term nursing facility as really the only  option I see available.   Diagnosis:    Axis I:  Other persistent mental disorders due to conditions classified elsewhere  Mood disorder in conditions classified elsewhere  Subarachnoid hemorrhage      Axis II: Deferred

## 2014-02-26 ENCOUNTER — Other Ambulatory Visit: Payer: Self-pay

## 2014-12-26 ENCOUNTER — Encounter (HOSPITAL_COMMUNITY): Payer: Self-pay

## 2014-12-26 ENCOUNTER — Emergency Department (HOSPITAL_COMMUNITY)
Admission: EM | Admit: 2014-12-26 | Discharge: 2014-12-26 | Disposition: A | Discharge status: Home / Self Care | Payer: Medicare Other | Attending: Emergency Medicine | Admitting: Emergency Medicine

## 2014-12-26 ENCOUNTER — Emergency Department (HOSPITAL_COMMUNITY): Payer: Medicare Other

## 2014-12-26 DIAGNOSIS — E119 Type 2 diabetes mellitus without complications: Secondary | ICD-10-CM | POA: Diagnosis not present

## 2014-12-26 DIAGNOSIS — I10 Essential (primary) hypertension: Secondary | ICD-10-CM | POA: Diagnosis not present

## 2014-12-26 DIAGNOSIS — Y9389 Activity, other specified: Secondary | ICD-10-CM | POA: Insufficient documentation

## 2014-12-26 DIAGNOSIS — S42211A Unspecified displaced fracture of surgical neck of right humerus, initial encounter for closed fracture: Secondary | ICD-10-CM | POA: Diagnosis not present

## 2014-12-26 DIAGNOSIS — Z87891 Personal history of nicotine dependence: Secondary | ICD-10-CM | POA: Insufficient documentation

## 2014-12-26 DIAGNOSIS — W19XXXA Unspecified fall, initial encounter: Secondary | ICD-10-CM | POA: Insufficient documentation

## 2014-12-26 DIAGNOSIS — F0390 Unspecified dementia without behavioral disturbance: Secondary | ICD-10-CM | POA: Insufficient documentation

## 2014-12-26 DIAGNOSIS — Z880 Allergy status to penicillin: Secondary | ICD-10-CM | POA: Diagnosis not present

## 2014-12-26 DIAGNOSIS — Y9289 Other specified places as the place of occurrence of the external cause: Secondary | ICD-10-CM | POA: Diagnosis not present

## 2014-12-26 DIAGNOSIS — Z79899 Other long term (current) drug therapy: Secondary | ICD-10-CM | POA: Diagnosis not present

## 2014-12-26 DIAGNOSIS — S4291XA Fracture of right shoulder girdle, part unspecified, initial encounter for closed fracture: Secondary | ICD-10-CM

## 2014-12-26 DIAGNOSIS — F419 Anxiety disorder, unspecified: Secondary | ICD-10-CM | POA: Insufficient documentation

## 2014-12-26 DIAGNOSIS — S4991XA Unspecified injury of right shoulder and upper arm, initial encounter: Secondary | ICD-10-CM | POA: Diagnosis present

## 2014-12-26 DIAGNOSIS — Y998 Other external cause status: Secondary | ICD-10-CM | POA: Insufficient documentation

## 2014-12-26 MED ORDER — OXYCODONE-ACETAMINOPHEN 5-325 MG PO TABS: 1.0000 | ORAL_TABLET | Freq: Four times a day (QID) | ORAL | Status: DC | PRN

## 2014-12-26 NOTE — ED Notes (Signed)
EMs reports pt is a resident of [facility] and had an unwitnessed fall today.  Unknown if pt lost consciousness.  EMS reports pt is alert but confused. Says confusion is her baseline.  EMS administered total of 10mg  of morphine IV for  C/o r shoulder pain, neck, pain, and back pain.  Reports strong radial pulse present.  bp elevated.  EMS started 20g IV in r hand.  CBG 164.  Pt on alzheimer's/dementia unit.

## 2014-12-26 NOTE — ED Notes (Signed)
Patient with no complaints at this time. Respirations even and unlabored. Skin warm/dry. Discharge instructions reviewed with patient at this time. Patient given opportunity to voice concerns/ask questions. IV removed per policy and band-aid applied to site. Patient discharged at this time and left Emergency Department with steady gait.  

## 2014-12-26 NOTE — Discharge Instructions (Signed)
Follow up with dr. Harrison this week. °

## 2014-12-26 NOTE — ED Provider Notes (Signed)
CSN: [id number]     Arrival date & time 12/26/14  1358 History  This chart was scribed for Benny LennertJoseph L Zaim Nitta, MD by Ronney LionSuzanne Le, ED Scribe. This patient was seen in room APA15/APA15 and the patient's care was started at 2:11 PM.    Chief Complaint  Patient presents with  . Fall    The history is provided by the patient and the EMS personnel. The history is limited by the condition of the patient. No language interpreter was used.      LEVEL 5 CAVEAT DUE TO DEMENTIA  HPI Comments: [name] is a 79 y.o. female brought in by ambulance, who presents to the Emergency Department for an unwitnessed fall, per EMS. Patient complains of associated right shoulder pain. She states that she cant remember what happened. EMS reports that patient's confusion is baseline.    Past Medical History  Diagnosis Date  . Diabetes mellitus   . Hypertension   . Dementia   . Anxiety   . Hypercholesteremia    Past Surgical History  Procedure Laterality Date  . Abdominal hysterectomy    . Cholecystectomy     No family history on file. History  Substance Use Topics  . Smoking status: Former Games developermoker  . Smokeless tobacco: Not on file  . Alcohol Use: No   OB History    No data available     Review of Systems    Allergies  Penicillins; Promethazine hcl; and Sulfonamide derivatives  Home Medications   Prior to Admission medications   Medication Sig Start Date End Date Taking? Authorizing Provider  acetaminophen (TYLENOL) 500 MG tablet Take 500-1,000 mg by mouth every 6 (six) hours as needed. Pain     Historical Provider, MD  ALPRAZolam (XANAX) 0.25 MG tablet Take 0.25 mg by mouth at bedtime as needed.      Historical Provider, MD  aspirin-acetaminophen-caffeine (EXCEDRIN EXTRA STRENGTH) [id number] MG per tablet Take 1 tablet by mouth every 6 (six) hours as needed. Headache     Historical Provider, MD  glipiZIDE (GLUCOTROL XL) 5 MG 24 hr tablet Take 5 mg by mouth daily.      Historical  Provider, MD  lisinopril (PRINIVIL,ZESTRIL) 5 MG tablet Take 5 mg by mouth daily.      Historical Provider, MD  memantine (NAMENDA) 10 MG tablet Take 10 mg by mouth 2 (two) times daily.      Historical Provider, MD  nebivolol (BYSTOLIC) 5 MG tablet Take 5 mg by mouth daily.      Historical Provider, MD  omeprazole (PRILOSEC) 20 MG capsule Take 20 mg by mouth daily.      Historical Provider, MD  sertraline (ZOLOFT) 50 MG tablet Take 50 mg by mouth daily.      Historical Provider, MD   BP 189/81 mmHg  Pulse 54  Temp(Src) 97.9 F (36.6 C) (Oral)  Resp 10  Ht 5\' 4"  (1.626 m)  Wt 148 lb (67.132 kg)  BMI 25.39 kg/m2  SpO2 98% Physical Exam  Constitutional: She appears well-developed.  HENT:  Head: Normocephalic.  Eyes: Conjunctivae are normal.  Neck: No tracheal deviation present.  Cardiovascular:  No murmur heard. Musculoskeletal: Normal range of motion. She exhibits tenderness.  Tenderness to right shoulder.  Neurological: She is alert.  Oriented to person only.  Skin: Skin is warm.  Nursing note and vitals reviewed.   ED Course  Procedures (including critical care time)  DIAGNOSTIC STUDIES: Oxygen Saturation is 98% on room air, normal by my  interpretation.     Labs Review Labs Reviewed - No data to display  Imaging Review No results found.   EKG Interpretation None      MDM   Final diagnoses:  None    Shoulder fx.   The chart was scribed for me under my direct supervision.  I personally performed the history, physical, and medical decision making and all procedures in the evaluation of this patient.[name].    Kumar Falwell L Saim Almanza, MD 12/26/14 [id number]

## 2015-01-03 ENCOUNTER — Encounter: Payer: Self-pay | Admitting: Orthopedic Surgery

## 2015-01-03 ENCOUNTER — Ambulatory Visit (INDEPENDENT_AMBULATORY_CARE_PROVIDER_SITE_OTHER): Payer: Medicare Other | Admitting: Orthopedic Surgery

## 2015-01-03 VITALS — BP 123/74 | Ht 64.0 in | Wt 148.0 lb

## 2015-01-03 DIAGNOSIS — S42209A Unspecified fracture of upper end of unspecified humerus, initial encounter for closed fracture: Secondary | ICD-10-CM | POA: Insufficient documentation

## 2015-01-03 DIAGNOSIS — S42201A Unspecified fracture of upper end of right humerus, initial encounter for closed fracture: Secondary | ICD-10-CM

## 2015-01-03 NOTE — Patient Instructions (Signed)
Wear sling-and-swathe for the next 2 weeks  Shoulder Fracture (Proximal Humerus or Glenoid) A shoulder fracture is a broken upper arm bone or a broken socket bone. The humerus is the upper arm bone and the glenoid is the shoulder socket. Proximal means the humerus is broken near the shoulder. Most of the time the bones of a broken shoulder are in an acceptable position. Usually, the injury can be treated with a shoulder immobilizer or sling and swath bandage. These devices support the arm and prevent any shoulder movement. If the bones are not in a good position, then surgery is sometimes needed. Shoulder fractures usually initially cause swelling, pain, and discoloration around the upper arm. They heal in 8 to 12 weeks with proper treatment. SYMPTOMS  At the time of injury:  Pain.  Tenderness.  Regular body contours are not normal. Later symptoms may include:  Swelling and bruising of the elbow and hand.  Swelling and bruising of the arm or chest. Other symptoms include:  Pain when lifting or turning the arm.  Paralysis below the fracture.  Numbness or coldness below the fracture. CAUSES   Indirect force from falling on an outstretched arm.  A blow to the shoulder. RISK INCREASES WITH:  Not being in shape.  Playing contact sports, such as football, soccer, hockey, or rugby.  Sports where falling on an outstretched arm occurs, such as basketball, skateboarding, or volleyball.  History of bone or joint disease.  History of shoulder injury. PREVENTION  Warm up before activity.  Stretch before activity.  Stay in shape with your:  Heart fitness.  Flexibility.  Shoulder Strength.  Falling with the proper technique. PROGNOSIS  In adults, healing time is about 7 weeks. For children, healing time is about 5 weeks. Surgery may be needed. RELATED COMPLICATIONS  The bones do not heal together (nonunion).  The bones do not align properly when they heal  (malunion).  Long-term problems with pain, stiffness, swelling, or loss of motion.  The injured arm heals shorter than the other.  Nerves are injured in the arm.  Arthritis in the shoulder.  Normal bone growth is interrupted in children.  Blood supply to the shoulder joint is diminished. TREATMENT If the bones are aligned, then initial treatment will be with ice and medicine to help with pain. The shoulder will be held in place with a sling (immobilization). The shoulder will be allowed to heal for up to 6 weeks. Injuries that may need surgery include:  Severe fractures.  Fractures that are not in appropriate alignment (displaced).  Non-displaced fractures (not common). Surgery helps the bones align correctly. The bones may be held in place with:  Sutures.  Wires.  Rods.  Plates.  Screws.  Pins. If you have had surgery or not, you will likely be assisted by a physical therapist or athletic trainer to get the best results with your injured shoulder. This will likely include exercises to strengthen and stretch the injured and surrounding areas. MEDICATION  If pain medicine is needed, nonsteroidal anti-inflammatory medicines (such as aspirin or ibuprofen) or other minor pain relievers (such as acetaminophen) are often advised.  Do not take pain medicine for 7 days before surgery.  Stronger pain relievers may be prescribed. Use only as directed and take only as much as you need. COLD THERAPY Cold treatment (icing) relieves pain and reduces inflammation. Cold treatment should be applied for 10 to 15 minutes every 2 to 3 hours, and immediately after activity that aggravates your symptoms. Use  ice packs or an ice massage. SEEK IMMEDIATE MEDICAL CARE IF:  You have severe shoulder pain unrelieved by rest and taking pain medicine.  You have pain, numbness, tingling, or weakness in the hand or wrist.  You have shortness of breath, chest pain, severe weakness, or  fainting.  You have severe pain with motion of the fingers or wrist.  Blue, gray, or dark color appears in the fingernails on injured extremity. Document Released: 12/17/2005 Document Revised: 03/10/2012 Document Reviewed: 03/31/2009 [facility] Patient Information 2015 [place], [place]. This information is not intended to replace advice given to you by your health care provider. Make sure you discuss any questions you have with your health care provider.

## 2015-01-03 NOTE — Progress Notes (Signed)
The patient's history was not obtainable due to dementia but we did review the ER record as follows  History   This chart was scribed for Tara Lennert, MD by Ronney Lion, ED Scribe. This patient was seen in room APA15/APA15 and the patient's care was started at 2:11 PM.   date of injury December 27    Chief Complaint   Patient presents with   .  Fall     The history is provided by the patient and the EMS personnel. The history is limited by the condition of the patient. No language interpreter was used.       LEVEL 5 CAVEAT DUE TO DEMENTIA  HPI Comments: [name] is a 80 y.o. female brought in by ambulance, who presents to the Emergency Department for an unwitnessed fall, per EMS. Patient complains of associated right shoulder pain. She states that she cant remember what happened. EMS reports that patient's confusion is baseline.      Past Medical History   Diagnosis  Date   .  Diabetes mellitus     .  Hypertension     .  Dementia     .  Anxiety     .  Hypercholesteremia      Past Surgical History   Procedure  Laterality  Date   .  Abdominal hysterectomy       .  Cholecystectomy        No family history on file. History   Substance Use Topics   .  Smoking status:  Former Games developer   .  Smokeless tobacco:  Not on file   .  Alcohol Use:  No    OB History     No data available       Review of Systems      Allergies   Penicillins; Promethazine hcl; and Sulfonamide derivatives    Home Medications      Prior to Admission medications    Medication  Sig  Start Date  End Date  Taking?  Authorizing Provider   acetaminophen (TYLENOL) 500 MG tablet  Take 500-1,000 mg by mouth every 6 (six) hours as needed. Pain         Historical Provider, MD   ALPRAZolam (XANAX) 0.25 MG tablet  Take 0.25 mg by mouth at bedtime as needed.          Historical Provider, MD   aspirin-acetaminophen-caffeine (EXCEDRIN EXTRA STRENGTH) [phone] MG per tablet  Take 1 tablet by mouth every  6 (six) hours as needed. Headache         Historical Provider, MD   glipiZIDE (GLUCOTROL XL) 5 MG 24 hr tablet  Take 5 mg by mouth daily.          Historical Provider, MD   lisinopril (PRINIVIL,ZESTRIL) 5 MG tablet  Take 5 mg by mouth daily.          Historical Provider, MD   memantine (NAMENDA) 10 MG tablet  Take 10 mg by mouth 2 (two) times daily.          Historical Provider, MD   nebivolol (BYSTOLIC) 5 MG tablet  Take 5 mg by mouth daily.          Historical Provider, MD   omeprazole (PRILOSEC) 20 MG capsule  Take 20 mg by mouth daily.          Historical Provider, MD   sertraline (ZOLOFT) 50 MG tablet  Take 50 mg by mouth daily.  Physical examination BP 123/74 mmHg  Ht  (1.626 m)  Wt 148 lb (67.132 kg)  BMI 25.39 kg/m2 Overall patient appearance is normal she is oriented to person place not time mood flat affect flat  Gait unremarkable  She has palpable tenderness over the proximal humerus with limited range of motion secondary to pain stability test could not be performed muscle tone is normal motor exam of the hand and wrist are normal skin is intact clavicle nontender good distal pulse normal sensation proximal distal lymph nodes negative cervical eczema    FINDINGS: There is a minimally displaced fracture in the surgical neck of the humerus. There is Lloyd impaction. Anatomic alignment at the glenohumeral joint.   IMPRESSION: Humeral neck fracture.  Independent interpretation of the x-ray shows a right humerus fracture. This proximal fracture meets the near criteria for nonoperative treatment  Recommend sling for 2 weeks  Repeat x-ray in 2 weeks  If x-rays show no displacement start physical therapy.

## 2015-01-18 ENCOUNTER — Ambulatory Visit (INDEPENDENT_AMBULATORY_CARE_PROVIDER_SITE_OTHER): Payer: Medicare Other

## 2015-01-18 ENCOUNTER — Ambulatory Visit (INDEPENDENT_AMBULATORY_CARE_PROVIDER_SITE_OTHER): Payer: Self-pay | Admitting: Orthopedic Surgery

## 2015-01-18 ENCOUNTER — Encounter: Payer: Self-pay | Admitting: Orthopedic Surgery

## 2015-01-18 VITALS — BP 119/66 | Ht 64.0 in | Wt 148.0 lb

## 2015-01-18 DIAGNOSIS — S4291XD Fracture of right shoulder girdle, part unspecified, subsequent encounter for fracture with routine healing: Secondary | ICD-10-CM

## 2015-01-18 NOTE — Progress Notes (Signed)
Patient ID: Tara PerkingBeverly J Micco, female   DOB: 04/28/1935, 80 y.o.   MRN: [id number] Chief Complaint  Patient presents with  . Follow-up    2 week recheck on right shoulder fracture with xray. DOI 12-26-14.    The patient has a proximal humerus fracture. The x-ray today shows mild varus alignment but stable meets all criteria for nonoperative treatment  Neurovascular exam is intact  She can start passive and active assisted range of motion follow-up in 6 weeks

## 2015-01-19 ENCOUNTER — Other Ambulatory Visit: Payer: Self-pay | Admitting: *Deleted

## 2015-01-19 DIAGNOSIS — S4291XD Fracture of right shoulder girdle, part unspecified, subsequent encounter for fracture with routine healing: Secondary | ICD-10-CM

## 2015-03-01 ENCOUNTER — Ambulatory Visit: Payer: Medicare Other | Admitting: Orthopedic Surgery

## 2015-03-03 ENCOUNTER — Ambulatory Visit (INDEPENDENT_AMBULATORY_CARE_PROVIDER_SITE_OTHER): Payer: Self-pay | Admitting: Orthopedic Surgery

## 2015-03-03 VITALS — BP 149/76 | Ht 64.0 in | Wt 148.0 lb

## 2015-03-03 DIAGNOSIS — S4291XD Fracture of right shoulder girdle, part unspecified, subsequent encounter for fracture with routine healing: Secondary | ICD-10-CM

## 2015-03-03 NOTE — Patient Instructions (Addendum)
  Remove sling  Discontinue therapy

## 2015-03-03 NOTE — Progress Notes (Signed)
Fracture care follow-up right proximal humerus fracture treated with physical therapy after a period of immobilization. She's finished her therapy did well with it and now comes in for reevaluation. She can touch her hand to mouth nose for head and top of her head think that's adequate function for injury she is released and can return on an as-needed basis and is no longer required to wear the sling and swathe.

## 2015-03-03 NOTE — Addendum Note (Signed)
Addended by: Adella HareBOOTHE, Niv Darley B on: 03/03/2015 12:02 PM   Modules accepted: Orders, Medications

## 2015-04-06 ENCOUNTER — Telehealth: Payer: Self-pay | Admitting: Orthopedic Surgery

## 2015-04-06 NOTE — Telephone Encounter (Signed)
Please advise that therapy was discontinued 03/03/15

## 2015-04-06 NOTE — Telephone Encounter (Signed)
Call received from [facility], physical therapist, Toni AmendCourtney, stating needs clarification on orders for therapy -- said that the upgraded order appears to be same as previous order from 3 months ago.  Please advise regarding new orders, also reference to passive to active range of motion.  Her direct ph# is [phone]

## 2015-04-07 NOTE — Telephone Encounter (Signed)
I have relayed as noted, however, therapist states that a new order, dated 03/31/15, was received.  I relayed I had not been aware, per chart; states she will follow up and have home care office call us or fax us with any further questions.

## 2015-06-14 ENCOUNTER — Emergency Department (HOSPITAL_COMMUNITY): Payer: Medicare Other

## 2015-06-14 ENCOUNTER — Encounter (HOSPITAL_COMMUNITY): Payer: Self-pay | Admitting: Emergency Medicine

## 2015-06-14 ENCOUNTER — Emergency Department (HOSPITAL_COMMUNITY)
Admission: EM | Admit: 2015-06-14 | Discharge: 2015-06-14 | Disposition: A | Discharge status: Home / Self Care | Payer: Medicare Other | Attending: Emergency Medicine | Admitting: Emergency Medicine

## 2015-06-14 DIAGNOSIS — R4182 Altered mental status, unspecified: Secondary | ICD-10-CM

## 2015-06-14 DIAGNOSIS — E079 Disorder of thyroid, unspecified: Secondary | ICD-10-CM | POA: Diagnosis not present

## 2015-06-14 DIAGNOSIS — Z87891 Personal history of nicotine dependence: Secondary | ICD-10-CM | POA: Insufficient documentation

## 2015-06-14 DIAGNOSIS — Z79899 Other long term (current) drug therapy: Secondary | ICD-10-CM | POA: Insufficient documentation

## 2015-06-14 DIAGNOSIS — I10 Essential (primary) hypertension: Secondary | ICD-10-CM | POA: Insufficient documentation

## 2015-06-14 DIAGNOSIS — F419 Anxiety disorder, unspecified: Secondary | ICD-10-CM | POA: Diagnosis not present

## 2015-06-14 DIAGNOSIS — F0390 Unspecified dementia without behavioral disturbance: Secondary | ICD-10-CM | POA: Insufficient documentation

## 2015-06-14 DIAGNOSIS — E780 Pure hypercholesterolemia: Secondary | ICD-10-CM | POA: Diagnosis not present

## 2015-06-14 DIAGNOSIS — E119 Type 2 diabetes mellitus without complications: Secondary | ICD-10-CM | POA: Diagnosis not present

## 2015-06-14 DIAGNOSIS — Z791 Long term (current) use of non-steroidal anti-inflammatories (NSAID): Secondary | ICD-10-CM | POA: Diagnosis not present

## 2015-06-14 DIAGNOSIS — Z880 Allergy status to penicillin: Secondary | ICD-10-CM | POA: Diagnosis not present

## 2015-06-14 HISTORY — DX: Disorder of thyroid, unspecified: E07.9

## 2015-06-14 LAB — BASIC METABOLIC PANEL
ANION GAP: 6 (ref 5–15)
BUN: 18 mg/dL (ref 6–20)
CHLORIDE: 111 mmol/L (ref 101–111)
CO2: 21 mmol/L — AB (ref 22–32)
Calcium: 9.5 mg/dL (ref 8.9–10.3)
Creatinine, Ser: 0.94 mg/dL (ref 0.44–1.00)
GFR calc Af Amer: >60 mL/min (ref >60)
GFR calc non Af Amer: 57 mL/min — ABNORMAL LOW (ref >60)
Glucose, Bld: 199 mg/dL — ABNORMAL HIGH (ref 65–99)
Potassium: 3.7 mmol/L (ref 3.5–5.1)
SODIUM: 138 mmol/L (ref 135–145)

## 2015-06-14 LAB — URINALYSIS, ROUTINE W REFLEX MICROSCOPIC
Bilirubin Urine: NEGATIVE
Glucose, UA: NEGATIVE mg/dL
Hgb urine dipstick: NEGATIVE
Leukocytes, UA: NEGATIVE
NITRITE: NEGATIVE
PH: 5.5 (ref 5.0–8.0)
Protein, ur: NEGATIVE mg/dL
Urobilinogen, UA: 0.2 mg/dL (ref 0.0–1.0)

## 2015-06-14 LAB — CBC WITH DIFFERENTIAL/PLATELET
BASOS ABS: 0 10*3/uL (ref 0.0–0.1)
BASOS PCT: 0 % (ref 0–1)
EOS ABS: 0.1 10*3/uL (ref 0.0–0.7)
Eosinophils Relative: 1 % (ref 0–5)
HEMATOCRIT: 39.5 % (ref 36.0–46.0)
Hemoglobin: 13.6 g/dL (ref 12.0–15.0)
Lymphocytes Relative: 25 % (ref 12–46)
Lymphs Abs: 1.8 10*3/uL (ref 0.7–4.0)
MCH: 30.2 pg (ref 26.0–34.0)
MCHC: 34.4 g/dL (ref 30.0–36.0)
MCV: 87.8 fL (ref 78.0–100.0)
Monocytes Absolute: 0.9 10*3/uL (ref 0.1–1.0)
Monocytes Relative: 13 % — ABNORMAL HIGH (ref 3–12)
NEUTROS ABS: 4.2 10*3/uL (ref 1.7–7.7)
Neutrophils Relative %: 61 % (ref 43–77)
Platelets: 117 10*3/uL — ABNORMAL LOW (ref 150–400)
RBC: 4.5 MIL/uL (ref 3.87–5.11)
RDW: 12.8 % (ref 11.5–15.5)
Smear Review: ADEQUATE
WBC: 7 10*3/uL (ref 4.0–10.5)

## 2015-06-14 NOTE — ED Notes (Signed)
Pt has been more confused, but staff at nursing home has no idea when her last normal was. Pt c/o headache. Pt is from [facility]
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
## 2015-06-14 NOTE — ED Notes (Signed)
Spouse Ashleymarie Dinsmore [phone] updated on pt's plan of care,

## 2015-06-14 NOTE — ED Notes (Signed)
Spoke with nursing staff at [facility] who advised that when she came into work at [employer], she noticed that the pt was having mild shaking all over, was not able to ambulate without nursing staff assistance, staff report that normally pt is able to ambulate with her walker by herself, normally knows who she is, can remember she is married and her husband if they mention him to her, does not know where she is at or what is going on.

## 2015-06-14 NOTE — ED Notes (Signed)
[facility] here to transport pt,  

## 2015-06-14 NOTE — ED Notes (Signed)
I&O cath performed, assisted by Angelina NT, pt tolerated well,
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
## 2015-06-14 NOTE — ED Notes (Signed)
Patient transported to CT 

## 2015-06-14 NOTE — Discharge Instructions (Signed)

## 2015-06-14 NOTE — ED Notes (Signed)
Dr Judd Lien speaking with pt's spouse on phone, updated on plan of care,

## 2015-06-14 NOTE — ED Notes (Signed)
Report given to [place] at [facility],

## 2015-06-14 NOTE — ED Notes (Signed)
Dr Delo at bedside,  

## 2015-08-29 ENCOUNTER — Emergency Department (HOSPITAL_COMMUNITY)
Admission: EM | Admit: 2015-08-29 | Discharge: 2015-08-29 | Disposition: A | Discharge status: Home / Self Care | Payer: Medicare Other | Attending: Emergency Medicine | Admitting: Emergency Medicine

## 2015-08-29 ENCOUNTER — Emergency Department (HOSPITAL_COMMUNITY): Payer: Medicare Other

## 2015-08-29 ENCOUNTER — Encounter (HOSPITAL_COMMUNITY): Payer: Self-pay | Admitting: Emergency Medicine

## 2015-08-29 DIAGNOSIS — I10 Essential (primary) hypertension: Secondary | ICD-10-CM | POA: Diagnosis not present

## 2015-08-29 DIAGNOSIS — Y92129 Unspecified place in nursing home as the place of occurrence of the external cause: Secondary | ICD-10-CM | POA: Insufficient documentation

## 2015-08-29 DIAGNOSIS — E079 Disorder of thyroid, unspecified: Secondary | ICD-10-CM | POA: Insufficient documentation

## 2015-08-29 DIAGNOSIS — Z791 Long term (current) use of non-steroidal anti-inflammatories (NSAID): Secondary | ICD-10-CM | POA: Diagnosis not present

## 2015-08-29 DIAGNOSIS — F0390 Unspecified dementia without behavioral disturbance: Secondary | ICD-10-CM | POA: Insufficient documentation

## 2015-08-29 DIAGNOSIS — Y998 Other external cause status: Secondary | ICD-10-CM | POA: Diagnosis not present

## 2015-08-29 DIAGNOSIS — S3992XA Unspecified injury of lower back, initial encounter: Secondary | ICD-10-CM | POA: Diagnosis not present

## 2015-08-29 DIAGNOSIS — T148XXA Other injury of unspecified body region, initial encounter: Secondary | ICD-10-CM

## 2015-08-29 DIAGNOSIS — E119 Type 2 diabetes mellitus without complications: Secondary | ICD-10-CM | POA: Diagnosis not present

## 2015-08-29 DIAGNOSIS — S299XXA Unspecified injury of thorax, initial encounter: Secondary | ICD-10-CM | POA: Insufficient documentation

## 2015-08-29 DIAGNOSIS — Z87891 Personal history of nicotine dependence: Secondary | ICD-10-CM | POA: Insufficient documentation

## 2015-08-29 DIAGNOSIS — S0990XA Unspecified injury of head, initial encounter: Secondary | ICD-10-CM | POA: Insufficient documentation

## 2015-08-29 DIAGNOSIS — W1839XA Other fall on same level, initial encounter: Secondary | ICD-10-CM | POA: Diagnosis not present

## 2015-08-29 DIAGNOSIS — Z79899 Other long term (current) drug therapy: Secondary | ICD-10-CM | POA: Insufficient documentation

## 2015-08-29 DIAGNOSIS — Z880 Allergy status to penicillin: Secondary | ICD-10-CM | POA: Insufficient documentation

## 2015-08-29 DIAGNOSIS — S59902A Unspecified injury of left elbow, initial encounter: Secondary | ICD-10-CM | POA: Insufficient documentation

## 2015-08-29 DIAGNOSIS — S199XXA Unspecified injury of neck, initial encounter: Secondary | ICD-10-CM | POA: Diagnosis present

## 2015-08-29 DIAGNOSIS — Y9389 Activity, other specified: Secondary | ICD-10-CM | POA: Diagnosis not present

## 2015-08-29 DIAGNOSIS — E780 Pure hypercholesterolemia: Secondary | ICD-10-CM | POA: Diagnosis not present

## 2015-08-29 DIAGNOSIS — T148 Other injury of unspecified body region: Secondary | ICD-10-CM | POA: Insufficient documentation

## 2015-08-29 LAB — BASIC METABOLIC PANEL
ANION GAP: 5 (ref 5–15)
BUN: 15 mg/dL (ref 6–20)
CO2: 25 mmol/L (ref 22–32)
Calcium: 9.7 mg/dL (ref 8.9–10.3)
Chloride: 109 mmol/L (ref 101–111)
Creatinine, Ser: 0.83 mg/dL (ref 0.44–1.00)
GFR calc Af Amer: >60 mL/min (ref >60)
GLUCOSE: 114 mg/dL — AB (ref 65–99)
POTASSIUM: 3.6 mmol/L (ref 3.5–5.1)
Sodium: 139 mmol/L (ref 135–145)

## 2015-08-29 LAB — CK: Total CK: 33 U/L — ABNORMAL LOW (ref 38–234)

## 2015-08-29 LAB — CBC WITH DIFFERENTIAL/PLATELET
Basophils Absolute: 0 10*3/uL (ref 0.0–0.1)
Basophils Relative: 1 % (ref 0–1)
Eosinophils Absolute: 0.1 10*3/uL (ref 0.0–0.7)
Eosinophils Relative: 2 % (ref 0–5)
HEMATOCRIT: 39.4 % (ref 36.0–46.0)
Hemoglobin: 14 g/dL (ref 12.0–15.0)
LYMPHS PCT: 36 % (ref 12–46)
Lymphs Abs: 2 10*3/uL (ref 0.7–4.0)
MCH: 30.7 pg (ref 26.0–34.0)
MCHC: 35.5 g/dL (ref 30.0–36.0)
MCV: 86.4 fL (ref 78.0–100.0)
Monocytes Absolute: 0.6 10*3/uL (ref 0.1–1.0)
Monocytes Relative: 11 % (ref 3–12)
NEUTROS ABS: 2.8 10*3/uL (ref 1.7–7.7)
Neutrophils Relative %: 50 % (ref 43–77)
Platelets: 128 10*3/uL — ABNORMAL LOW (ref 150–400)
RBC: 4.56 MIL/uL (ref 3.87–5.11)
RDW: 12.7 % (ref 11.5–15.5)
WBC: 5.5 10*3/uL (ref 4.0–10.5)

## 2015-08-29 MED ORDER — MORPHINE SULFATE (PF) 2 MG/ML IV SOLN
2.0000 mg | Freq: Once | INTRAVENOUS | Status: AC
  Administered 2015-08-29: 2 mg via INTRAVENOUS
  Filled 2015-08-29: qty 1

## 2015-08-29 MED ORDER — ONDANSETRON HCL 4 MG/2ML IJ SOLN
4.0000 mg | Freq: Once | INTRAMUSCULAR | Status: AC
  Administered 2015-08-29: 4 mg via INTRAVENOUS
  Filled 2015-08-29: qty 2

## 2015-08-29 NOTE — ED Notes (Signed)
Patient arrives via EMS from [facility] with c/o unwitnessed fall around 1700 today. Found in floor. Patient c/o head, neck pain. Arrived fully immobilized with c-collar in place. Patient with dementia.

## 2015-08-29 NOTE — Discharge Instructions (Signed)
Contusion °A contusion is a deep bruise. Contusions happen when an injury causes bleeding under the skin. Signs of bruising include pain, puffiness (swelling), and discolored skin. The contusion may turn blue, purple, or yellow. °HOME CARE  °· Put ice on the injured area. °¨ Put ice in a plastic bag. °¨ Place a towel between your skin and the bag. °¨ Leave the ice on for 15-20 minutes, 03-04 times a day. °· Only take medicine as told by your doctor. °· Rest the injured area. °· If possible, raise (elevate) the injured area to lessen puffiness. °GET HELP RIGHT AWAY IF:  °· You have more bruising or puffiness. °· You have pain that is getting worse. °· Your puffiness or pain is not helped by medicine. °MAKE SURE YOU:  °· Understand these instructions. °· Will watch your condition. °· Will get help right away if you are not doing well or get worse. °Document Released: 06/04/2008 Document Revised: 03/10/2012 Document Reviewed: 10/22/2011 °[employer]® Patient Information ©2015 [employer]. This information is not intended to replace advice given to you by your health care provider. Make sure you discuss any questions you have with your health care provider. ° °

## 2015-08-29 NOTE — ED Provider Notes (Signed)
CSN: [id number]     Arrival date & time 08/29/15  1746 History   First MD Initiated Contact with Patient 08/29/15 1755     Chief Complaint  Patient presents with  . Fall     (Consider location/radiation/quality/duration/timing/severity/associated sxs/prior Treatment) HPI Comments: Patient presents to the ER for evaluation after a fall. Patient presents from skilled nursing facility. She has a history of dementia, cannot recall any events about the fall provider any further information. Patient was reportedly found on the floor, might have been on the floor for as long as 30 minutes before she was found. Patient has been complaining of head and neck pain for EMS. She also complains of pain in her left elbow and back. She cannot describe or quantify the pain for me.  Patient is a 80 y.o. female presenting with fall.  Fall Associated symptoms include headaches.    Past Medical History  Diagnosis Date  . Diabetes mellitus   . Hypertension   . Dementia   . Anxiety   . Hypercholesteremia   . Thyroid disease    Past Surgical History  Procedure Laterality Date  . Abdominal hysterectomy    . Cholecystectomy     No family history on file. Social History  Substance Use Topics  . Smoking status: Former Games developer  . Smokeless tobacco: None  . Alcohol Use: No   OB History    No data available     Review of Systems  Musculoskeletal: Positive for back pain and neck pain.  Neurological: Positive for headaches.  All other systems reviewed and are negative.     Allergies  Penicillins; Promethazine hcl; and Sulfonamide derivatives  Home Medications   Prior to Admission medications   Medication Sig Start Date End Date Taking? Authorizing Provider  acetaminophen (TYLENOL) 500 MG tablet Take 1,000 mg by mouth every 6 (six) hours as needed.    Historical Provider, MD  amLODipine (NORVASC) 5 MG tablet Take 5 mg by mouth daily.    Historical Provider, MD  cholecalciferol (VITAMIN D)  1000 UNITS tablet Take 1,000 Units by mouth daily.    Historical Provider, MD  citalopram (CELEXA) 20 MG tablet Take 20 mg by mouth daily.    Historical Provider, MD  cloNIDine (CATAPRES) 0.1 MG tablet Take 0.1 mg by mouth 4 (four) times daily as needed (*Administered as needed for BP levels >160/90).    Historical Provider, MD  donepezil (ARICEPT) 10 MG tablet Take 10 mg by mouth at bedtime.    Historical Provider, MD  levothyroxine (SYNTHROID, LEVOTHROID) 25 MCG tablet Take 25 mcg by mouth daily before breakfast.    Historical Provider, MD  lisinopril (PRINIVIL,ZESTRIL) 40 MG tablet Take 40 mg by mouth daily.    Historical Provider, MD  loperamide (IMODIUM A-D) 2 MG tablet Take 2 mg by mouth 4 (four) times daily as needed for diarrhea or loose stools.    Historical Provider, MD  magnesium oxide (MAG-OX) 400 MG tablet Take 400 mg by mouth 2 (two) times daily.    Historical Provider, MD  meloxicam (MOBIC) 15 MG tablet Take 15 mg by mouth daily.    Historical Provider, MD  Memantine HCl ER (NAMENDA XR) 28 MG CP24 Take 28 mg by mouth daily.    Historical Provider, MD  metFORMIN (GLUCOPHAGE) 500 MG tablet Take 250 mg by mouth 2 (two) times daily with a meal.    Historical Provider, MD  metoprolol tartrate (LOPRESSOR) 25 MG tablet Take 25 mg by mouth 2 (two)  times daily.    Historical Provider, MD  oxyCODONE-acetaminophen (PERCOCET/ROXICET) 5-325 MG per tablet Take 1 tablet by mouth every 6 (six) hours as needed. Patient not taking: Reported on 06/14/2015 12/26/14   Bethann Berkshire, MD  pravastatin (PRAVACHOL) 40 MG tablet Take 40 mg by mouth daily.    Historical Provider, MD  topiramate (TOPAMAX) 50 MG tablet Take 50 mg by mouth 2 (two) times daily.    Historical Provider, MD   BP 157/62 mmHg  Pulse 61  Temp(Src) 98.5 F (36.9 C) (Oral)  Resp 12  Wt 150 lb (68.04 kg)  SpO2 99% Physical Exam  Constitutional: She appears well-developed and well-nourished. No distress.  HENT:  Head: Normocephalic  and atraumatic.  Right Ear: Hearing normal.  Left Ear: Hearing normal.  Nose: Nose normal.  Mouth/Throat: Oropharynx is clear and moist and mucous membranes are normal.  Eyes: Conjunctivae and EOM are normal. Pupils are equal, round, and reactive to light.  Neck: Normal range of motion. Neck supple. Muscular tenderness present.  Cardiovascular: Regular rhythm, S1 normal and S2 normal.  Exam reveals no gallop and no friction rub.   No murmur heard. Pulmonary/Chest: Effort normal and breath sounds normal. No respiratory distress. She exhibits no tenderness.  Abdominal: Soft. Normal appearance and bowel sounds are normal. There is no hepatosplenomegaly. There is no tenderness. There is no rebound, no guarding, no tenderness at McBurney's point and negative Murphy's sign. No hernia.  Musculoskeletal: Normal range of motion.       Left elbow: She exhibits normal range of motion, no swelling, no effusion and no deformity. Tenderness found.       Right hip: Normal.       Left hip: Normal.       Thoracic back: She exhibits tenderness.       Lumbar back: She exhibits tenderness.  Neurological: She is alert. She has normal strength. No cranial nerve deficit or sensory deficit. Coordination normal. GCS eye subscore is 4. GCS verbal subscore is 5. GCS motor subscore is 6.  Skin: Skin is warm, dry and intact. No rash noted. No cyanosis.  Psychiatric: She has a normal mood and affect. Her speech is normal and behavior is normal. Thought content normal.  Nursing note and vitals reviewed.   ED Course  Procedures (including critical care time) Labs Review Labs Reviewed  CBC WITH DIFFERENTIAL/PLATELET  BASIC METABOLIC PANEL  CK    Imaging Review No results found. I have personally reviewed and evaluated these images and lab results as part of my medical decision-making.   EKG Interpretation None      MDM   Final diagnoses:  None   contusion  Presented to the ER from skilled nursing  facility for evaluation after unwitnessed fall. Patient is planning of head and neck pain as well as pain in the left elbow area. Examination did not reveal any significant findings. CT head and cervical spine performed because of pain in these areas. No acute abnormality noted. She also complained of back pain. X-ray thoracic and lumbar spine negative, left elbow also unremarkable. Workup consistent with contusions from ground-level fall, return to nursing home.  Gilda Crease, MD 08/29/15 [phone]

## 2015-08-29 NOTE — ED Notes (Signed)
Called to give report to [facility] nursing facility. Nurse states unable to transport and asked for EMS to transport.

## 2015-12-07 ENCOUNTER — Emergency Department (HOSPITAL_COMMUNITY): Payer: Medicare Other

## 2015-12-07 ENCOUNTER — Emergency Department (HOSPITAL_COMMUNITY)
Admission: EM | Admit: 2015-12-07 | Discharge: 2015-12-07 | Disposition: A | Discharge status: Home / Self Care | Payer: Medicare Other | Attending: Emergency Medicine | Admitting: Emergency Medicine

## 2015-12-07 ENCOUNTER — Encounter (HOSPITAL_COMMUNITY): Payer: Self-pay | Admitting: Emergency Medicine

## 2015-12-07 DIAGNOSIS — E7800 Pure hypercholesterolemia, unspecified: Secondary | ICD-10-CM | POA: Diagnosis not present

## 2015-12-07 DIAGNOSIS — Z87891 Personal history of nicotine dependence: Secondary | ICD-10-CM | POA: Insufficient documentation

## 2015-12-07 DIAGNOSIS — Z880 Allergy status to penicillin: Secondary | ICD-10-CM | POA: Diagnosis not present

## 2015-12-07 DIAGNOSIS — I10 Essential (primary) hypertension: Secondary | ICD-10-CM | POA: Diagnosis not present

## 2015-12-07 DIAGNOSIS — Y998 Other external cause status: Secondary | ICD-10-CM | POA: Diagnosis not present

## 2015-12-07 DIAGNOSIS — Z79899 Other long term (current) drug therapy: Secondary | ICD-10-CM | POA: Diagnosis not present

## 2015-12-07 DIAGNOSIS — F0390 Unspecified dementia without behavioral disturbance: Secondary | ICD-10-CM | POA: Diagnosis not present

## 2015-12-07 DIAGNOSIS — Z791 Long term (current) use of non-steroidal anti-inflammatories (NSAID): Secondary | ICD-10-CM | POA: Insufficient documentation

## 2015-12-07 DIAGNOSIS — Z7984 Long term (current) use of oral hypoglycemic drugs: Secondary | ICD-10-CM | POA: Insufficient documentation

## 2015-12-07 DIAGNOSIS — E119 Type 2 diabetes mellitus without complications: Secondary | ICD-10-CM | POA: Insufficient documentation

## 2015-12-07 DIAGNOSIS — Y92129 Unspecified place in nursing home as the place of occurrence of the external cause: Secondary | ICD-10-CM | POA: Insufficient documentation

## 2015-12-07 DIAGNOSIS — E079 Disorder of thyroid, unspecified: Secondary | ICD-10-CM | POA: Diagnosis not present

## 2015-12-07 DIAGNOSIS — R42 Dizziness and giddiness: Secondary | ICD-10-CM | POA: Insufficient documentation

## 2015-12-07 DIAGNOSIS — S3991XA Unspecified injury of abdomen, initial encounter: Secondary | ICD-10-CM | POA: Diagnosis not present

## 2015-12-07 DIAGNOSIS — R140 Abdominal distension (gaseous): Secondary | ICD-10-CM | POA: Diagnosis not present

## 2015-12-07 DIAGNOSIS — S3992XA Unspecified injury of lower back, initial encounter: Secondary | ICD-10-CM | POA: Diagnosis not present

## 2015-12-07 DIAGNOSIS — F419 Anxiety disorder, unspecified: Secondary | ICD-10-CM | POA: Insufficient documentation

## 2015-12-07 DIAGNOSIS — Y9389 Activity, other specified: Secondary | ICD-10-CM | POA: Diagnosis not present

## 2015-12-07 DIAGNOSIS — W1839XA Other fall on same level, initial encounter: Secondary | ICD-10-CM | POA: Insufficient documentation

## 2015-12-07 LAB — CBC WITH DIFFERENTIAL/PLATELET
Basophils Absolute: 0 10*3/uL (ref 0.0–0.1)
Basophils Relative: 0 %
Eosinophils Absolute: 0.1 10*3/uL (ref 0.0–0.7)
Eosinophils Relative: 1 %
HCT: 40.5 % (ref 36.0–46.0)
Hemoglobin: 14.3 g/dL (ref 12.0–15.0)
LYMPHS PCT: 17 %
Lymphs Abs: 1.9 10*3/uL (ref 0.7–4.0)
MCH: 30.8 pg (ref 26.0–34.0)
MCHC: 35.3 g/dL (ref 30.0–36.0)
MCV: 87.3 fL (ref 78.0–100.0)
Monocytes Absolute: 1 10*3/uL (ref 0.1–1.0)
Monocytes Relative: 9 %
Neutro Abs: 8 10*3/uL — ABNORMAL HIGH (ref 1.7–7.7)
Neutrophils Relative %: 73 %
PLATELETS: 133 10*3/uL — AB (ref 150–400)
RBC: 4.64 MIL/uL (ref 3.87–5.11)
RDW: 12.5 % (ref 11.5–15.5)
WBC: 11.1 10*3/uL — ABNORMAL HIGH (ref 4.0–10.5)

## 2015-12-07 LAB — URINALYSIS, ROUTINE W REFLEX MICROSCOPIC
BILIRUBIN URINE: NEGATIVE
GLUCOSE, UA: NEGATIVE mg/dL
HGB URINE DIPSTICK: NEGATIVE
Ketones, ur: NEGATIVE mg/dL
Leukocytes, UA: NEGATIVE
Nitrite: NEGATIVE
Protein, ur: NEGATIVE mg/dL
Specific Gravity, Urine: 1.01 (ref 1.005–1.030)
pH: 7 (ref 5.0–8.0)

## 2015-12-07 LAB — COMPREHENSIVE METABOLIC PANEL
ALT: 27 U/L (ref 14–54)
AST: 22 U/L (ref 15–41)
Albumin: 3.9 g/dL (ref 3.5–5.0)
Alkaline Phosphatase: 96 U/L (ref 38–126)
Anion gap: 9 (ref 5–15)
BILIRUBIN TOTAL: 0.7 mg/dL (ref 0.3–1.2)
BUN: 13 mg/dL (ref 6–20)
CO2: 23 mmol/L (ref 22–32)
CREATININE: 0.63 mg/dL (ref 0.44–1.00)
Calcium: 9.3 mg/dL (ref 8.9–10.3)
Chloride: 107 mmol/L (ref 101–111)
GFR calc Af Amer: >60 mL/min (ref >60)
GFR calc non Af Amer: >60 mL/min (ref >60)
GLUCOSE: 194 mg/dL — AB (ref 65–99)
POTASSIUM: 3.9 mmol/L (ref 3.5–5.1)
Sodium: 139 mmol/L (ref 135–145)
TOTAL PROTEIN: 7.2 g/dL (ref 6.5–8.1)

## 2015-12-07 LAB — CK: CK TOTAL: 40 U/L (ref 38–234)

## 2015-12-07 LAB — TROPONIN I: Troponin I: <0.03 ng/mL (ref <0.031)

## 2015-12-07 MED ORDER — SODIUM CHLORIDE 0.9 % IV BOLUS (SEPSIS)
500.0000 mL | Freq: Once | INTRAVENOUS | Status: AC
  Administered 2015-12-07: 500 mL via INTRAVENOUS

## 2015-12-07 NOTE — ED Provider Notes (Signed)
CSN: [id number]     Arrival date & time 12/07/15  0727 History   First MD Initiated Contact with Patient 12/07/15 [phone]     Chief Complaint  Patient presents with  . Fall  . Back Pain     Level V caveat due to dementia Patient is a 80 y.o. female presenting with fall and back pain.  Fall  Back Pain  patient presents from the nursing home. She has baseline dementia. Reportedly was found on the floor and another patient's room. May have been complaining of back pain. Now she states she just feels a little bad all over. Denies back pain. Had been incontinent of urine and stool. She was fully dressed.  Past Medical History  Diagnosis Date  . Diabetes mellitus   . Hypertension   . Dementia   . Anxiety   . Hypercholesteremia   . Thyroid disease    Past Surgical History  Procedure Laterality Date  . Abdominal hysterectomy    . Cholecystectomy     History reviewed. No pertinent family history. Social History  Substance Use Topics  . Smoking status: Former Games developer  . Smokeless tobacco: None  . Alcohol Use: No   OB History    No data available     Review of Systems  Unable to perform ROS: Dementia  Musculoskeletal: Positive for back pain.      Allergies  Penicillins; Promethazine hcl; and Sulfonamide derivatives  Home Medications   Prior to Admission medications   Medication Sig Start Date End Date Taking? Authorizing Provider  acetaminophen (TYLENOL) 500 MG tablet Take 1,000 mg by mouth every 6 (six) hours as needed.    Historical Provider, MD  amLODipine (NORVASC) 5 MG tablet Take 5 mg by mouth daily.    Historical Provider, MD  cholecalciferol (VITAMIN D) 1000 UNITS tablet Take 1,000 Units by mouth daily.    Historical Provider, MD  citalopram (CELEXA) 20 MG tablet Take 20 mg by mouth daily.    Historical Provider, MD  cloNIDine (CATAPRES) 0.1 MG tablet Take 0.1 mg by mouth 4 (four) times daily as needed (*Administered as needed for BP levels >160/90).    Historical  Provider, MD  donepezil (ARICEPT) 10 MG tablet Take 10 mg by mouth at bedtime.    Historical Provider, MD  levothyroxine (SYNTHROID, LEVOTHROID) 25 MCG tablet Take 25 mcg by mouth daily before breakfast.    Historical Provider, MD  lisinopril (PRINIVIL,ZESTRIL) 40 MG tablet Take 40 mg by mouth daily.    Historical Provider, MD  loperamide (IMODIUM A-D) 2 MG tablet Take 2 mg by mouth 4 (four) times daily as needed for diarrhea or loose stools.    Historical Provider, MD  magnesium oxide (MAG-OX) 400 MG tablet Take 400 mg by mouth 2 (two) times daily.    Historical Provider, MD  meloxicam (MOBIC) 15 MG tablet Take 15 mg by mouth daily.    Historical Provider, MD  Memantine HCl ER (NAMENDA XR) 28 MG CP24 Take 28 mg by mouth daily.    Historical Provider, MD  metFORMIN (GLUCOPHAGE) 500 MG tablet Take 250 mg by mouth 2 (two) times daily with a meal.    Historical Provider, MD  metoprolol tartrate (LOPRESSOR) 25 MG tablet Take 25 mg by mouth 2 (two) times daily.    Historical Provider, MD  oxyCODONE-acetaminophen (PERCOCET/ROXICET) 5-325 MG per tablet Take 1 tablet by mouth every 6 (six) hours as needed. Patient not taking: Reported on 06/14/2015 12/26/14   Bethann Berkshire, MD  pravastatin (PRAVACHOL) 40 MG tablet Take 40 mg by mouth daily.    Historical Provider, MD  topiramate (TOPAMAX) 50 MG tablet Take 50 mg by mouth 2 (two) times daily.    Historical Provider, MD   BP 143/106 mmHg  Pulse 55  Temp(Src) 97.7 F (36.5 C) (Oral)  Resp 15  Ht 5\' 5"  (1.651 m)  Wt 150 lb (68.04 kg)  BMI 24.96 kg/m2  SpO2 99% Physical Exam  Constitutional: She appears well-developed.  HENT:  Head: Atraumatic.  Eyes: Pupils are equal, round, and reactive to light.  Neck: Neck supple.  No midline cervical tenderness  Cardiovascular: Normal rate.   Pulmonary/Chest: Effort normal.  Abdominal: She exhibits distension.  Mild distention and lower abdominal tenderness.  Musculoskeletal: She exhibits no tenderness.   Neurological: She is alert.  Patient reportedly at demented baseline.  Skin: Skin is warm.    ED Course  Procedures (including critical care time) Labs Review Labs Reviewed  COMPREHENSIVE METABOLIC PANEL - Abnormal; Notable for the following:    Glucose, Bld 194 (*)    All other components within normal limits  CBC WITH DIFFERENTIAL/PLATELET - Abnormal; Notable for the following:    WBC 11.1 (*)    Platelets 133 (*)    Neutro Abs 8.0 (*)    All other components within normal limits  URINALYSIS, ROUTINE W REFLEX MICROSCOPIC (NOT AT [facility])  CK  TROPONIN I    Imaging Review Ct Head Wo Contrast  12/07/2015  CLINICAL DATA:  Dizziness.  Found on floor at nursing home EXAM: CT HEAD WITHOUT CONTRAST TECHNIQUE: Contiguous axial images were obtained from the base of the skull through the vertex without intravenous contrast. COMPARISON:  08/29/2015 FINDINGS: Skull and Sinuses:Negative for fracture or destructive process. The visualized mastoids, middle ears, and imaged paranasal sinuses are clear. Orbits: Bilateral cataract resection.  No acute finding. Brain: No evidence of acute infarction, hemorrhage, hydrocephalus, or mass lesion/mass effect. Age congruent chronic small vessel disease with ischemic gliosis mainly seen around the lateral ventricles. Volume loss with mild to moderate medial temporal involvement, implicating Alzheimer's disease in this patient with history of dementia. IMPRESSION: No acute or traumatic finding. Electronically Signed   By: Marnee SpringJonathon  Watts M.D.   On: 12/07/2015 09:49   Dg Chest Port 1 View  12/07/2015  CLINICAL DATA:  Dizziness. EXAM: PORTABLE CHEST 1 VIEW COMPARISON:  06/14/2015 FINDINGS: Low lung volumes with bibasilar atelectasis. Heart is normal size. No effusions. No acute bony abnormality. IMPRESSION: Low lung volumes with bibasilar atelectasis. Electronically Signed   By: Charlett NoseKevin  Dover M.D.   On: 12/07/2015 09:16   I have personally reviewed and evaluated  these images and lab results as part of my medical decision-making.   EKG Interpretation None     ED ECG REPORT   Date: 12/07/2015  Rate: 52  Rhythm: sinus bradycardia  QRS Axis: normal  Intervals: normal  ST/T Wave abnormalities: nonspecific ST/T changes  Conduction Disutrbances:none  Narrative Interpretation:   Old EKG Reviewed: unchanged    MDM   Final diagnoses:  Dizziness  Dementia, without behavioral disturbance    *Patient presents after being found on the floor in a neighbor's room and the nursing home. Reportedly at baseline now. Discussed with patient's daughter states the dizziness is not unusual for her. States she tends to get dizzy when she is anxious also. No infection. No rhabdo. No back tenderness. Will discharge home to the nursing home.    Benjiman CoreNathan Geana Walts, MD 12/07/15 [phone]

## 2015-12-07 NOTE — ED Notes (Signed)
NT attempted to get patient to ambulate to bathroom. Patient stood up and became dizzy and had to sit immediately back down. Patient complained of dizziness, denies pain.

## 2015-12-07 NOTE — ED Notes (Signed)
Madaline Guthriealled Donna RN at [facility] inquiring about patient transportation back to facility. Lupita LeashDonna stated she would check with facility manager and call me back.

## 2015-12-07 NOTE — ED Notes (Signed)
[facility]. States patient was found in another patient's room in the floor this morning fully dressed. Patient complaining of back pain, per EMS patient has history of back pain. Patient has alzheimer's and states mental status is same as usual.

## 2015-12-07 NOTE — Discharge Instructions (Signed)
Follow-up with her doctor as needed °

## 2015-12-07 NOTE — ED Notes (Signed)
Patient's daughter called and requested to be notified if needed. Name is [name], number is [phone]. Daughter states patient has a DNR.

## 2015-12-07 NOTE — ED Notes (Signed)
Offered patient meal tray. Patient states she does not want anything to eat at this time. States "I'll wait a little bit."

## 2015-12-07 NOTE — ED Notes (Signed)
Called [facility] back inquiring about transport back to facility. States someone will arrive to ER to transport patient back to facility.

## 2016-04-24 ENCOUNTER — Emergency Department (HOSPITAL_COMMUNITY): Payer: Medicare Other

## 2016-04-24 ENCOUNTER — Encounter (HOSPITAL_COMMUNITY): Payer: Self-pay | Admitting: Cardiology

## 2016-04-24 ENCOUNTER — Emergency Department (HOSPITAL_COMMUNITY)
Admission: EM | Admit: 2016-04-24 | Discharge: 2016-04-24 | Disposition: A | Discharge status: Skilled Nursing Facility | Payer: Medicare Other | Attending: Emergency Medicine | Admitting: Emergency Medicine

## 2016-04-24 DIAGNOSIS — Z79899 Other long term (current) drug therapy: Secondary | ICD-10-CM | POA: Diagnosis not present

## 2016-04-24 DIAGNOSIS — Z87891 Personal history of nicotine dependence: Secondary | ICD-10-CM | POA: Insufficient documentation

## 2016-04-24 DIAGNOSIS — N390 Urinary tract infection, site not specified: Secondary | ICD-10-CM | POA: Diagnosis not present

## 2016-04-24 DIAGNOSIS — Z7984 Long term (current) use of oral hypoglycemic drugs: Secondary | ICD-10-CM | POA: Diagnosis not present

## 2016-04-24 DIAGNOSIS — R4182 Altered mental status, unspecified: Secondary | ICD-10-CM | POA: Diagnosis present

## 2016-04-24 DIAGNOSIS — E119 Type 2 diabetes mellitus without complications: Secondary | ICD-10-CM | POA: Diagnosis not present

## 2016-04-24 DIAGNOSIS — F0390 Unspecified dementia without behavioral disturbance: Secondary | ICD-10-CM | POA: Diagnosis not present

## 2016-04-24 DIAGNOSIS — I10 Essential (primary) hypertension: Secondary | ICD-10-CM | POA: Insufficient documentation

## 2016-04-24 LAB — I-STAT CG4 LACTIC ACID, ED: LACTIC ACID, VENOUS: 1.49 mmol/L (ref 0.5–2.0)

## 2016-04-24 LAB — CBC WITH DIFFERENTIAL/PLATELET
BASOS ABS: 0 10*3/uL (ref 0.0–0.1)
Basophils Relative: 0 %
EOS ABS: 0.1 10*3/uL (ref 0.0–0.7)
Eosinophils Relative: 1 %
HEMATOCRIT: 41.5 % (ref 36.0–46.0)
Hemoglobin: 14.7 g/dL (ref 12.0–15.0)
LYMPHS ABS: 1.6 10*3/uL (ref 0.7–4.0)
Lymphocytes Relative: 12 %
MCH: 30.4 pg (ref 26.0–34.0)
MCHC: 35.4 g/dL (ref 30.0–36.0)
MCV: 85.7 fL (ref 78.0–100.0)
MONOS PCT: 7 %
Monocytes Absolute: 0.9 10*3/uL (ref 0.1–1.0)
Neutro Abs: 10.7 10*3/uL — ABNORMAL HIGH (ref 1.7–7.7)
Neutrophils Relative %: 80 %
PLATELETS: 127 10*3/uL — AB (ref 150–400)
RBC: 4.84 MIL/uL (ref 3.87–5.11)
RDW: 12.9 % (ref 11.5–15.5)
WBC: 13.3 10*3/uL — AB (ref 4.0–10.5)

## 2016-04-24 LAB — COMPREHENSIVE METABOLIC PANEL
ALT: 29 U/L (ref 14–54)
AST: 22 U/L (ref 15–41)
Albumin: 4 g/dL (ref 3.5–5.0)
Alkaline Phosphatase: 68 U/L (ref 38–126)
Anion gap: 12 (ref 5–15)
BUN: 13 mg/dL (ref 6–20)
CHLORIDE: 109 mmol/L (ref 101–111)
CO2: 20 mmol/L — ABNORMAL LOW (ref 22–32)
CREATININE: 0.85 mg/dL (ref 0.44–1.00)
Calcium: 9.9 mg/dL (ref 8.9–10.3)
Glucose, Bld: 197 mg/dL — ABNORMAL HIGH (ref 65–99)
POTASSIUM: 3.2 mmol/L — AB (ref 3.5–5.1)
Sodium: 141 mmol/L (ref 135–145)
Total Bilirubin: 1.6 mg/dL — ABNORMAL HIGH (ref 0.3–1.2)
Total Protein: 7.6 g/dL (ref 6.5–8.1)

## 2016-04-24 LAB — URINALYSIS, ROUTINE W REFLEX MICROSCOPIC
Bilirubin Urine: NEGATIVE
Glucose, UA: NEGATIVE mg/dL
KETONES UR: 40 mg/dL — AB
LEUKOCYTES UA: NEGATIVE
Nitrite: NEGATIVE
PROTEIN: 30 mg/dL — AB
Specific Gravity, Urine: >1.03 — ABNORMAL HIGH (ref 1.005–1.030)
pH: 5.5 (ref 5.0–8.0)

## 2016-04-24 LAB — URINE MICROSCOPIC-ADD ON

## 2016-04-24 LAB — LIPASE, BLOOD: LIPASE: 19 U/L (ref 11–51)

## 2016-04-24 LAB — I-STAT TROPONIN, ED: Troponin i, poc: 0 ng/mL (ref 0.00–0.08)

## 2016-04-24 LAB — CBG MONITORING, ED: GLUCOSE-CAPILLARY: 200 mg/dL — AB (ref 65–99)

## 2016-04-24 MED ORDER — SODIUM CHLORIDE 0.9 % IV SOLN
INTRAVENOUS | Status: DC
Start: 2016-04-24 — End: 2016-04-25
  Administered 2016-04-24: 19:00:00 via INTRAVENOUS

## 2016-04-24 MED ORDER — CIPROFLOXACIN HCL 500 MG PO TABS: 500.0000 mg | ORAL_TABLET | Freq: Two times a day (BID) | ORAL | Status: DC

## 2016-04-24 MED ORDER — SODIUM CHLORIDE 0.9 % IV BOLUS (SEPSIS)
500.0000 mL | Freq: Once | INTRAVENOUS | Status: AC
  Administered 2016-04-24: 500 mL via INTRAVENOUS

## 2016-04-24 MED ORDER — CIPROFLOXACIN IN D5W 400 MG/200ML IV SOLN
400.0000 mg | Freq: Once | INTRAVENOUS | Status: AC
  Administered 2016-04-24: 400 mg via INTRAVENOUS
  Filled 2016-04-24: qty 200

## 2016-04-24 NOTE — Discharge Instructions (Signed)
Extensive workup without any specific findings other than concerns for urinary tract infection. Take the antibiotic Cipro as directed for the next 7 return for any new or worse symptoms.

## 2016-04-24 NOTE — ED Provider Notes (Signed)
CSN: [id number]     Arrival date & time 04/24/16  1754 History   First MD Initiated Contact with Patient 04/24/16 1758     Chief Complaint  Patient presents with  . Altered Mental Status     (Consider location/radiation/quality/duration/timing/severity/associated sxs/prior Treatment) Patient is a 81 y.o. female presenting with altered mental status. The history is provided by the patient. The history is limited by the condition of the patient.  Altered Mental Status  patient sent in from nursing facility patient has a history of dementia. Supposedly more drowsy than usual. Recently had a tooth extraction. Patient's a resident at [place] [facility]. No history of any fevers nausea vomiting. No history of any upper respiratory symptoms.  Past Medical History  Diagnosis Date  . Diabetes mellitus   . Hypertension   . Dementia   . Anxiety   . Hypercholesteremia   . Thyroid disease    Past Surgical History  Procedure Laterality Date  . Abdominal hysterectomy    . Cholecystectomy     History reviewed. No pertinent family history. Social History  Substance Use Topics  . Smoking status: Former Games developer  . Smokeless tobacco: None  . Alcohol Use: No   OB History    No data available     Review of Systems  Unable to perform ROS: Dementia      Allergies  Penicillins; Promethazine hcl; and Sulfonamide derivatives  Home Medications   Prior to Admission medications   Medication Sig Start Date End Date Taking? Authorizing Provider  acetaminophen (TYLENOL) 500 MG tablet Take 1,000 mg by mouth every 6 (six) hours as needed.    Historical Provider, MD  amLODipine (NORVASC) 5 MG tablet Take 5 mg by mouth daily.    Historical Provider, MD  cholecalciferol (VITAMIN D) 1000 UNITS tablet Take 1,000 Units by mouth daily.    Historical Provider, MD  ciprofloxacin (CIPRO) 500 MG tablet Take 1 tablet (500 mg total) by mouth 2 (two) times daily. 04/24/16   Vanetta Mulders, MD  citalopram (CELEXA) 20  MG tablet Take 20 mg by mouth daily.    Historical Provider, MD  cloNIDine (CATAPRES) 0.1 MG tablet Take 0.1 mg by mouth 4 (four) times daily as needed (*Administered as needed for BP levels >160/90).    Historical Provider, MD  donepezil (ARICEPT) 10 MG tablet Take 10 mg by mouth at bedtime.    Historical Provider, MD  levothyroxine (SYNTHROID, LEVOTHROID) 25 MCG tablet Take 25 mcg by mouth daily before breakfast.    Historical Provider, MD  lisinopril (PRINIVIL,ZESTRIL) 40 MG tablet Take 40 mg by mouth daily.    Historical Provider, MD  loperamide (IMODIUM A-D) 2 MG tablet Take 2 mg by mouth 4 (four) times daily as needed for diarrhea or loose stools.    Historical Provider, MD  magnesium oxide (MAG-OX) 400 MG tablet Take 400 mg by mouth 2 (two) times daily.    Historical Provider, MD  meloxicam (MOBIC) 15 MG tablet Take 15 mg by mouth daily.    Historical Provider, MD  Memantine HCl ER (NAMENDA XR) 28 MG CP24 Take 28 mg by mouth daily.    Historical Provider, MD  metFORMIN (GLUCOPHAGE) 500 MG tablet Take 250 mg by mouth 2 (two) times daily with a meal.    Historical Provider, MD  metoprolol tartrate (LOPRESSOR) 25 MG tablet Take 25 mg by mouth 2 (two) times daily.    Historical Provider, MD  oxyCODONE-acetaminophen (PERCOCET/ROXICET) 5-325 MG per tablet Take 1 tablet by mouth  every 6 (six) hours as needed. Patient not taking: Reported on 06/14/2015 12/26/14   Bethann BerkshireJoseph Zammit, MD  pravastatin (PRAVACHOL) 40 MG tablet Take 40 mg by mouth daily.    Historical Provider, MD  topiramate (TOPAMAX) 50 MG tablet Take 50 mg by mouth 2 (two) times daily.    Historical Provider, MD   BP 187/72 mmHg  Pulse 78  Temp(Src) 98.5 F (36.9 C) (Oral)  Resp 22  Ht 5\' 4"  (1.626 m)  Wt 68.04 kg  BMI 25.73 kg/m2  SpO2 97% Physical Exam  Constitutional: She appears well-developed and well-nourished. No distress.  HENT:  Head: Normocephalic and atraumatic.  Mouth/Throat: Oropharynx is clear and moist.  Eyes:  Conjunctivae and EOM are normal. Pupils are equal, round, and reactive to light.  Neck: Normal range of motion.  Cardiovascular: Normal rate, regular rhythm and normal heart sounds.   No murmur heard. Pulmonary/Chest: Effort normal and breath sounds normal. No respiratory distress.  Abdominal: Soft. Bowel sounds are normal. There is no tenderness.  Musculoskeletal: Normal range of motion. She exhibits no edema.  Neurological: She is alert. No cranial nerve deficit. She exhibits normal muscle tone. Coordination normal.  Patient drowsy but wakes up and answers questions. Not so sure if the answers are always accurate.   Skin: Skin is warm. No rash noted. No erythema.  Nursing note and vitals reviewed.  room air saturations have been in the upper 90s.  ED Course  Procedures (including critical care time) Labs Review Labs Reviewed  COMPREHENSIVE METABOLIC PANEL - Abnormal; Notable for the following:    Potassium 3.2 (*)    CO2 20 (*)    Glucose, Bld 197 (*)    Total Bilirubin 1.6 (*)    All other components within normal limits  URINALYSIS, ROUTINE W REFLEX MICROSCOPIC (NOT AT [facility]) - Abnormal; Notable for the following:    Color, Urine AMBER (*)    Specific Gravity, Urine >1.030 (*)    Hgb urine dipstick SMALL (*)    Ketones, ur 40 (*)    Protein, ur 30 (*)    All other components within normal limits  CBC WITH DIFFERENTIAL/PLATELET - Abnormal; Notable for the following:    WBC 13.3 (*)    Platelets 127 (*)    Neutro Abs 10.7 (*)    All other components within normal limits  URINE MICROSCOPIC-ADD ON - Abnormal; Notable for the following:    Squamous Epithelial / LPF 0-5 (*)    Bacteria, UA MANY (*)    All other components within normal limits  CBG MONITORING, ED - Abnormal; Notable for the following:    Glucose-Capillary 200 (*)    All other components within normal limits  URINE CULTURE  CULTURE, BLOOD (ROUTINE X 2)  CULTURE, BLOOD (ROUTINE X 2)  LIPASE, BLOOD  I-STAT CG4  LACTIC ACID, ED  Rosezena SensorI-STAT TROPOININ, ED    Imaging Review Dg Chest 2 View  04/24/2016  CLINICAL DATA:  Acute onset of altered mental status. Initial encounter. EXAM: CHEST  2 VIEW COMPARISON:  Chest radiograph from 12/07/2015 FINDINGS: The lungs are well-aerated. There is mild elevation of the right hemidiaphragm. Mild peribronchial thickening is noted. There is no evidence of focal opacification, pleural effusion or pneumothorax. The heart is borderline normal in size. No acute osseous abnormalities are seen. IMPRESSION: Mild elevation of the right hemidiaphragm. Mild peribronchial thickening noted. Electronically Signed   By: Roanna RaiderJeffery  Chang M.D.   On: 04/24/2016 20:08   Ct Head Wo Contrast  04/24/2016  CLINICAL DATA:  Altered mental status EXAM: CT HEAD WITHOUT CONTRAST TECHNIQUE: Contiguous axial images were obtained from the base of the skull through the vertex without intravenous contrast. COMPARISON:  12/07/2015 FINDINGS: Bony calvarium is intact. Diffuse atrophic changes are noted. No findings to suggest acute hemorrhage, acute infarction or space-occupying mass lesion are noted. IMPRESSION: Atrophic changes without acute abnormality. Electronically Signed   By: Alcide Clever M.D.   On: 04/24/2016 20:06   I have personally reviewed and evaluated these images and lab results as part of my medical decision-making.   EKG Interpretation   Date/Time:  Tuesday April 24 2016 18:22:58 EDT Ventricular Rate:  89 PR Interval:  147 QRS Duration: 82 QT Interval:  384 QTC Calculation: 467 R Axis:   -37 Text Interpretation:  Sinus rhythm Left axis deviation Low voltage,  precordial leads Borderline T abnormalities, anterior leads No significant  change since last tracing Confirmed by Zaylia Riolo  MD, Oliviana Mcgahee [phone]) on  04/24/2016 6:33:52 PM      MDM   Final diagnoses:  UTI (lower urinary tract infection)  Dementia, without behavioral disturbance    Extensive workup while for the report of  decreased mental status from her baseline dementia. No significant findings other than bacteria in the urine treated for urinary tract infection. Culture sent. Treated with IV Cipro here. Because of continued on oral Cipro for the next 7 days. Head CT negative chest x-ray negative for pneumonia troponin negative lactic acid is not elevated. Patient seems to be baseline as per telephone discussion that the nurse had with her daughter. Will discharge back to nursing facility.    Vanetta Mulders, MD 04/24/16 2136

## 2016-04-24 NOTE — ED Notes (Addendum)
Pt sleeping a lot ,  Not acting right.  Recent tooth extraction.  VSS.  [facility]ident [facility].

## 2016-04-26 LAB — URINE CULTURE: CULTURE: NO GROWTH

## 2016-04-26 NOTE — ED Provider Notes (Signed)
Contacted by flow manager with positive blood culture. Patient has gram-positive cocci in anaerobic bottle. Records were reviewed. Patient was afebrile at arrival. She was administered IV and oral Cipro for presumed urinary tract infection. Urinalysis, however, does not show pathogen in the culture. Patient is currently residing in a skilled nursing facility. As she is already been started on antibiotics and culture results is not back, recommend awaiting full culture ID and sensitivity.  Gilda Creasehristopher J Claudetta Sallie, MD 04/26/16 [phone]

## 2016-04-27 LAB — CULTURE, BLOOD (ROUTINE X 2)

## 2016-04-29 LAB — CULTURE, BLOOD (ROUTINE X 2): Culture: NO GROWTH

## 2016-04-30 ENCOUNTER — Telehealth (HOSPITAL_BASED_OUTPATIENT_CLINIC_OR_DEPARTMENT_OTHER): Payer: Self-pay | Admitting: Emergency Medicine

## 2016-04-30 NOTE — Telephone Encounter (Signed)
No sensitivities done by lab due to only one bottle aerobic bottle, other bottle negative

## 2016-05-04 ENCOUNTER — Emergency Department (HOSPITAL_COMMUNITY): Payer: Medicare Other

## 2016-05-04 ENCOUNTER — Encounter (HOSPITAL_COMMUNITY): Payer: Self-pay | Admitting: Emergency Medicine

## 2016-05-04 ENCOUNTER — Inpatient Hospital Stay (HOSPITAL_COMMUNITY)
Admission: EM | Admit: 2016-05-04 | Discharge: 2016-05-09 | DRG: 470 | Disposition: A | Discharge status: Skilled Nursing Facility | Payer: Medicare Other | Attending: Internal Medicine | Admitting: Internal Medicine

## 2016-05-04 DIAGNOSIS — Z888 Allergy status to other drugs, medicaments and biological substances status: Secondary | ICD-10-CM | POA: Diagnosis not present

## 2016-05-04 DIAGNOSIS — Z7984 Long term (current) use of oral hypoglycemic drugs: Secondary | ICD-10-CM | POA: Diagnosis not present

## 2016-05-04 DIAGNOSIS — Z87891 Personal history of nicotine dependence: Secondary | ICD-10-CM

## 2016-05-04 DIAGNOSIS — S72011A Unspecified intracapsular fracture of right femur, initial encounter for closed fracture: Principal | ICD-10-CM | POA: Diagnosis present

## 2016-05-04 DIAGNOSIS — R739 Hyperglycemia, unspecified: Secondary | ICD-10-CM

## 2016-05-04 DIAGNOSIS — S72009A Fracture of unspecified part of neck of unspecified femur, initial encounter for closed fracture: Secondary | ICD-10-CM | POA: Insufficient documentation

## 2016-05-04 DIAGNOSIS — D62 Acute posthemorrhagic anemia: Secondary | ICD-10-CM | POA: Diagnosis not present

## 2016-05-04 DIAGNOSIS — E038 Other specified hypothyroidism: Secondary | ICD-10-CM | POA: Diagnosis not present

## 2016-05-04 DIAGNOSIS — Z880 Allergy status to penicillin: Secondary | ICD-10-CM | POA: Diagnosis not present

## 2016-05-04 DIAGNOSIS — G309 Alzheimer's disease, unspecified: Secondary | ICD-10-CM | POA: Diagnosis present

## 2016-05-04 DIAGNOSIS — R1110 Vomiting, unspecified: Secondary | ICD-10-CM

## 2016-05-04 DIAGNOSIS — F0390 Unspecified dementia without behavioral disturbance: Secondary | ICD-10-CM | POA: Diagnosis present

## 2016-05-04 DIAGNOSIS — M25559 Pain in unspecified hip: Secondary | ICD-10-CM

## 2016-05-04 DIAGNOSIS — Z01811 Encounter for preprocedural respiratory examination: Secondary | ICD-10-CM

## 2016-05-04 DIAGNOSIS — S72001A Fracture of unspecified part of neck of right femur, initial encounter for closed fracture: Secondary | ICD-10-CM | POA: Diagnosis not present

## 2016-05-04 DIAGNOSIS — W19XXXA Unspecified fall, initial encounter: Secondary | ICD-10-CM | POA: Diagnosis present

## 2016-05-04 DIAGNOSIS — Z79899 Other long term (current) drug therapy: Secondary | ICD-10-CM | POA: Diagnosis not present

## 2016-05-04 DIAGNOSIS — E1165 Type 2 diabetes mellitus with hyperglycemia: Secondary | ICD-10-CM | POA: Diagnosis present

## 2016-05-04 DIAGNOSIS — E785 Hyperlipidemia, unspecified: Secondary | ICD-10-CM | POA: Diagnosis present

## 2016-05-04 DIAGNOSIS — Z66 Do not resuscitate: Secondary | ICD-10-CM | POA: Diagnosis present

## 2016-05-04 DIAGNOSIS — I10 Essential (primary) hypertension: Secondary | ICD-10-CM | POA: Diagnosis present

## 2016-05-04 DIAGNOSIS — F0280 Dementia in other diseases classified elsewhere without behavioral disturbance: Secondary | ICD-10-CM | POA: Diagnosis present

## 2016-05-04 DIAGNOSIS — Z09 Encounter for follow-up examination after completed treatment for conditions other than malignant neoplasm: Secondary | ICD-10-CM

## 2016-05-04 DIAGNOSIS — Z90710 Acquired absence of both cervix and uterus: Secondary | ICD-10-CM | POA: Diagnosis not present

## 2016-05-04 DIAGNOSIS — Z882 Allergy status to sulfonamides status: Secondary | ICD-10-CM

## 2016-05-04 DIAGNOSIS — E039 Hypothyroidism, unspecified: Secondary | ICD-10-CM | POA: Diagnosis not present

## 2016-05-04 DIAGNOSIS — Z419 Encounter for procedure for purposes other than remedying health state, unspecified: Secondary | ICD-10-CM

## 2016-05-04 DIAGNOSIS — M25551 Pain in right hip: Secondary | ICD-10-CM | POA: Diagnosis present

## 2016-05-04 DIAGNOSIS — K2970 Gastritis, unspecified, without bleeding: Secondary | ICD-10-CM | POA: Diagnosis present

## 2016-05-04 HISTORY — DX: Alzheimer's disease, unspecified: G30.9

## 2016-05-04 HISTORY — DX: Dementia in other diseases classified elsewhere, unspecified severity, without behavioral disturbance, psychotic disturbance, mood disturbance, and anxiety: F02.80

## 2016-05-04 LAB — COMPREHENSIVE METABOLIC PANEL
ALT: 17 U/L (ref 14–54)
ANION GAP: 8 (ref 5–15)
AST: 17 U/L (ref 15–41)
Albumin: 3.3 g/dL — ABNORMAL LOW (ref 3.5–5.0)
Alkaline Phosphatase: 69 U/L (ref 38–126)
BILIRUBIN TOTAL: 0.6 mg/dL (ref 0.3–1.2)
BUN: 13 mg/dL (ref 6–20)
CHLORIDE: 109 mmol/L (ref 101–111)
CO2: 23 mmol/L (ref 22–32)
Calcium: 9.3 mg/dL (ref 8.9–10.3)
Creatinine, Ser: 0.7 mg/dL (ref 0.44–1.00)
Glucose, Bld: 231 mg/dL — ABNORMAL HIGH (ref 65–99)
POTASSIUM: 3.4 mmol/L — AB (ref 3.5–5.1)
SODIUM: 140 mmol/L (ref 135–145)
TOTAL PROTEIN: 6.4 g/dL — AB (ref 6.5–8.1)

## 2016-05-04 LAB — CBC WITH DIFFERENTIAL/PLATELET
BASOS ABS: 0.1 10*3/uL (ref 0.0–0.1)
Basophils Relative: 1 %
EOS PCT: 3 %
Eosinophils Absolute: 0.2 10*3/uL (ref 0.0–0.7)
HCT: 34.9 % — ABNORMAL LOW (ref 36.0–46.0)
HEMOGLOBIN: 12.2 g/dL (ref 12.0–15.0)
LYMPHS ABS: 1.6 10*3/uL (ref 0.7–4.0)
LYMPHS PCT: 22 %
MCH: 30.4 pg (ref 26.0–34.0)
MCHC: 35 g/dL (ref 30.0–36.0)
MCV: 87 fL (ref 78.0–100.0)
Monocytes Absolute: 0.5 10*3/uL (ref 0.1–1.0)
Monocytes Relative: 7 %
NEUTROS PCT: 67 %
Neutro Abs: 5 10*3/uL (ref 1.7–7.7)
PLATELETS: 170 10*3/uL (ref 150–400)
RBC: 4.01 MIL/uL (ref 3.87–5.11)
RDW: 12.6 % (ref 11.5–15.5)
WBC: 7.3 10*3/uL (ref 4.0–10.5)

## 2016-05-04 LAB — PROTIME-INR
INR: 1.1 (ref 0.00–1.49)
PROTHROMBIN TIME: 14.4 s (ref 11.6–15.2)

## 2016-05-04 LAB — APTT: aPTT: 29 seconds (ref 24–37)

## 2016-05-04 LAB — CBC
HEMATOCRIT: 35.3 % — AB (ref 36.0–46.0)
HEMOGLOBIN: 12 g/dL (ref 12.0–15.0)
MCH: 29.3 pg (ref 26.0–34.0)
MCHC: 34 g/dL (ref 30.0–36.0)
MCV: 86.1 fL (ref 78.0–100.0)
Platelets: 193 10*3/uL (ref 150–400)
RBC: 4.1 MIL/uL (ref 3.87–5.11)
RDW: 12.4 % (ref 11.5–15.5)
WBC: 7.4 10*3/uL (ref 4.0–10.5)

## 2016-05-04 LAB — URINALYSIS, ROUTINE W REFLEX MICROSCOPIC
BILIRUBIN URINE: NEGATIVE
GLUCOSE, UA: NEGATIVE mg/dL
Hgb urine dipstick: NEGATIVE
KETONES UR: NEGATIVE mg/dL
Leukocytes, UA: NEGATIVE
NITRITE: NEGATIVE
PH: 6.5 (ref 5.0–8.0)
PROTEIN: NEGATIVE mg/dL
Specific Gravity, Urine: 1.01 (ref 1.005–1.030)

## 2016-05-04 LAB — TYPE AND SCREEN
ABO/RH(D): O POS
ANTIBODY SCREEN: NEGATIVE

## 2016-05-04 LAB — GLUCOSE, CAPILLARY: Glucose-Capillary: 153 mg/dL — ABNORMAL HIGH (ref 65–99)

## 2016-05-04 MED ORDER — MEMANTINE HCL ER 28 MG PO CP24
28.0000 mg | ORAL_CAPSULE | Freq: Every day | ORAL | Status: DC
  Administered 2016-05-04 – 2016-05-09 (×5): 28 mg via ORAL
  Filled 2016-05-04 (×6): qty 1

## 2016-05-04 MED ORDER — INSULIN ASPART 100 UNIT/ML ~~LOC~~ SOLN
0.0000 [IU] | Freq: Three times a day (TID) | SUBCUTANEOUS | Status: DC
  Administered 2016-05-05: 1 [IU] via SUBCUTANEOUS
  Administered 2016-05-05: 2 [IU] via SUBCUTANEOUS
  Administered 2016-05-06: 3 [IU] via SUBCUTANEOUS
  Administered 2016-05-06: 2 [IU] via SUBCUTANEOUS
  Administered 2016-05-06: 3 [IU] via SUBCUTANEOUS
  Administered 2016-05-08 (×2): 2 [IU] via SUBCUTANEOUS
  Administered 2016-05-08: 1 [IU] via SUBCUTANEOUS
  Administered 2016-05-09 (×2): 2 [IU] via SUBCUTANEOUS

## 2016-05-04 MED ORDER — DIFLUPREDNATE 0.05 % OP EMUL: 1.0000 [drp] | Freq: Two times a day (BID) | OPHTHALMIC | Status: DC

## 2016-05-04 MED ORDER — OXYCODONE-ACETAMINOPHEN 5-325 MG PO TABS
1.0000 | ORAL_TABLET | ORAL | Status: DC | PRN
  Administered 2016-05-04: 1 via ORAL
  Filled 2016-05-04: qty 1

## 2016-05-04 MED ORDER — ENOXAPARIN SODIUM 40 MG/0.4ML ~~LOC~~ SOLN
40.0000 mg | SUBCUTANEOUS | Status: DC
  Administered 2016-05-04 – 2016-05-05 (×2): 40 mg via SUBCUTANEOUS
  Filled 2016-05-04 (×2): qty 0.4

## 2016-05-04 MED ORDER — HYDROCODONE-ACETAMINOPHEN 5-325 MG PO TABS
1.0000 | ORAL_TABLET | Freq: Four times a day (QID) | ORAL | Status: DC | PRN
  Administered 2016-05-04 – 2016-05-05 (×3): 2 via ORAL
  Filled 2016-05-04 (×3): qty 2

## 2016-05-04 MED ORDER — DONEPEZIL HCL 10 MG PO TABS
10.0000 mg | ORAL_TABLET | Freq: Every day | ORAL | Status: DC
  Administered 2016-05-04 – 2016-05-08 (×5): 10 mg via ORAL
  Filled 2016-05-04 (×5): qty 1

## 2016-05-04 MED ORDER — LEVOTHYROXINE SODIUM 25 MCG PO TABS
25.0000 ug | ORAL_TABLET | Freq: Every day | ORAL | Status: DC
  Administered 2016-05-05 – 2016-05-08 (×4): 25 ug via ORAL
  Filled 2016-05-04 (×5): qty 1

## 2016-05-04 MED ORDER — CITALOPRAM HYDROBROMIDE 20 MG PO TABS
20.0000 mg | ORAL_TABLET | Freq: Every day | ORAL | Status: DC
  Administered 2016-05-05 – 2016-05-09 (×4): 20 mg via ORAL
  Filled 2016-05-04 (×4): qty 1

## 2016-05-04 MED ORDER — AMLODIPINE BESYLATE 5 MG PO TABS
5.0000 mg | ORAL_TABLET | Freq: Every day | ORAL | Status: DC
  Administered 2016-05-06 – 2016-05-09 (×3): 5 mg via ORAL
  Filled 2016-05-04 (×4): qty 1

## 2016-05-04 MED ORDER — TOPIRAMATE 25 MG PO TABS
50.0000 mg | ORAL_TABLET | Freq: Two times a day (BID) | ORAL | Status: DC
  Administered 2016-05-04 – 2016-05-09 (×9): 50 mg via ORAL
  Filled 2016-05-04 (×9): qty 2

## 2016-05-04 MED ORDER — MORPHINE SULFATE (PF) 2 MG/ML IV SOLN
0.5000 mg | INTRAVENOUS | Status: DC | PRN
  Administered 2016-05-07: 0.5 mg via INTRAVENOUS
  Filled 2016-05-04: qty 1

## 2016-05-04 MED ORDER — PRAVASTATIN SODIUM 40 MG PO TABS
40.0000 mg | ORAL_TABLET | Freq: Every day | ORAL | Status: DC
  Administered 2016-05-05 – 2016-05-09 (×4): 40 mg via ORAL
  Filled 2016-05-04 (×4): qty 1

## 2016-05-04 MED ORDER — VITAMIN D 1000 UNITS PO TABS
1000.0000 [IU] | ORAL_TABLET | Freq: Every day | ORAL | Status: DC
  Administered 2016-05-05 – 2016-05-09 (×4): 1000 [IU] via ORAL
  Filled 2016-05-04 (×4): qty 1

## 2016-05-04 MED ORDER — METOPROLOL TARTRATE 25 MG PO TABS
25.0000 mg | ORAL_TABLET | Freq: Two times a day (BID) | ORAL | Status: DC
  Administered 2016-05-04 – 2016-05-09 (×9): 25 mg via ORAL
  Filled 2016-05-04 (×9): qty 1

## 2016-05-04 MED ORDER — LISINOPRIL 40 MG PO TABS
40.0000 mg | ORAL_TABLET | Freq: Every day | ORAL | Status: DC
  Administered 2016-05-06 – 2016-05-09 (×3): 40 mg via ORAL
  Filled 2016-05-04 (×4): qty 1

## 2016-05-04 NOTE — ED Notes (Addendum)
[facility] via EMS after trip and fall 3 days ago. Xray results show acute impacted subcapital right femoral neck fracture.

## 2016-05-04 NOTE — ED Provider Notes (Signed)
CSN: [id number]     Arrival date & time 05/04/16  1325 History   First MD Initiated Contact with Patient 05/04/16 1330     Chief Complaint  Patient presents with  . Hip Pain   L5 caveat secondary to dementia  (Consider location/radiation/quality/duration/timing/severity/associated sxs/prior Treatment) Patient is a 81 y.o. female presenting with hip pain.  Hip Pain   81 year old female history of dementia presents here from [facility] nursing home with reports that she has a right hip fracture. They report that she fell several days ago. Patient is able to tell me that she thinks that she fell. She is unable to give any more specifics about it. She is complaining of right hip pain. She states that she has pain in her hands but does not think that she injured them. Most of history is obtained from records sent with patient from nursing home. I've also reviewed old notes from here. She recently had a positive blood culture that was noted on April 27. She had grandpa positive cocci Past Medical History  Diagnosis Date  . Diabetes mellitus   . Hypertension   . Dementia   . Anxiety   . Hypercholesteremia   . Thyroid disease   . Alzheimer disease     per facility paperwork dated 05/04/2016   Past Surgical History  Procedure Laterality Date  . Abdominal hysterectomy    . Cholecystectomy     History reviewed. No pertinent family history. Social History  Substance Use Topics  . Smoking status: Former Games developer  . Smokeless tobacco: None  . Alcohol Use: No   OB History    No data available     Review of Systems  Unable to perform ROS: Dementia      Allergies  Penicillins; Promethazine hcl; and Sulfonamide derivatives  Home Medications   Prior to Admission medications   Medication Sig Start Date End Date Taking? Authorizing Provider  acetaminophen (TYLENOL) 325 MG tablet Take 650 mg by mouth 3 (three) times daily.   Yes Historical Provider, MD  amLODipine (NORVASC) 5 MG tablet  Take 5 mg by mouth daily.   Yes Historical Provider, MD  Artificial Tear Solution (SOOTHE XP OP) Apply 1 drop to eye 2 (two) times daily.   Yes Historical Provider, MD  cholecalciferol (VITAMIN D) 1000 UNITS tablet Take 1,000 Units by mouth daily.   Yes Historical Provider, MD  ciprofloxacin (CIPRO) 500 MG tablet Take 1 tablet (500 mg total) by mouth 2 (two) times daily. 04/24/16  Yes Vanetta Mulders, MD  citalopram (CELEXA) 20 MG tablet Take 20 mg by mouth daily.   Yes Historical Provider, MD  Difluprednate (DUREZOL) 0.05 % EMUL Apply 1 drop to eye 2 (two) times daily.   Yes Historical Provider, MD  donepezil (ARICEPT) 10 MG tablet Take 10 mg by mouth at bedtime.   Yes Historical Provider, MD  ibuprofen (ADVIL,MOTRIN) 800 MG tablet Take 800 mg by mouth 2 (two) times daily.   Yes Historical Provider, MD  levothyroxine (SYNTHROID, LEVOTHROID) 25 MCG tablet Take 25 mcg by mouth daily before breakfast.   Yes Historical Provider, MD  lisinopril (PRINIVIL,ZESTRIL) 40 MG tablet Take 40 mg by mouth daily.   Yes Historical Provider, MD  Memantine HCl ER (NAMENDA XR) 28 MG CP24 Take 28 mg by mouth daily.   Yes Historical Provider, MD  metFORMIN (GLUCOPHAGE) 500 MG tablet Take 250 mg by mouth 2 (two) times daily with a meal.   Yes Historical Provider, MD  metoprolol tartrate (LOPRESSOR)  25 MG tablet Take 25 mg by mouth 2 (two) times daily.   Yes Historical Provider, MD  pravastatin (PRAVACHOL) 40 MG tablet Take 40 mg by mouth daily.   Yes Historical Provider, MD  topiramate (TOPAMAX) 50 MG tablet Take 50 mg by mouth 2 (two) times daily.   Yes Historical Provider, MD  acetaminophen (TYLENOL) 500 MG tablet Take 1,000 mg by mouth every 6 (six) hours as needed.    Historical Provider, MD  cloNIDine (CATAPRES) 0.1 MG tablet Take 0.1 mg by mouth 4 (four) times daily as needed (*Administered as needed for BP levels >160/90).    Historical Provider, MD  loperamide (IMODIUM A-D) 2 MG tablet Take 2 mg by mouth 4 (four)  times daily as needed for diarrhea or loose stools.    Historical Provider, MD  magnesium oxide (MAG-OX) 400 MG tablet Take 400 mg by mouth 2 (two) times daily.    Historical Provider, MD  meloxicam (MOBIC) 15 MG tablet Take 15 mg by mouth daily.    Historical Provider, MD  oxyCODONE-acetaminophen (PERCOCET/ROXICET) 5-325 MG per tablet Take 1 tablet by mouth every 6 (six) hours as needed. Patient not taking: Reported on 06/14/2015 12/26/14   Bethann Berkshire, MD   BP 183/64 mmHg  Pulse 72  Temp(Src) 97.2 F (36.2 C) (Oral)  Resp 18  Ht 5\' 2"  (1.575 m)  Wt 65.772 kg  BMI 26.51 kg/m2  SpO2 100% Physical Exam  Constitutional: She appears well-developed and well-nourished.  HENT:  Head: Normocephalic and atraumatic.  Eyes: Pupils are equal, round, and reactive to light.  Neck: Normal range of motion. Neck supple.  Cardiovascular: Normal rate and normal heart sounds.   Pulmonary/Chest: Effort normal and breath sounds normal.  No contusion or signs of trauma  Abdominal: Soft. There is no tenderness.  No contusions or signs of trauma  Musculoskeletal:       Legs: Contusion noted dorsal aspect right hand with no tenderness on palpation or obvious deformity Entire spine is palpated without point tenderness noted..  Neurological: She is alert.  Patient is oriented to person but not to place  Nursing note and vitals reviewed.   ED Course  Procedures (including critical care time) Labs Review Labs Reviewed  URINE CULTURE  APTT  COMPREHENSIVE METABOLIC PANEL  CBC WITH DIFFERENTIAL/PLATELET  PROTIME-INR  URINALYSIS, ROUTINE W REFLEX MICROSCOPIC (NOT AT [facility])  TYPE AND SCREEN    Imaging Review Dg Chest 1 View  05/04/2016  CLINICAL DATA:  Pain following fall 3 days prior EXAM: CHEST 1 VIEW COMPARISON:  April 24, 2016 FINDINGS: There is no edema or consolidation. Heart is upper normal in size with pulmonary vascularity within normal limits. No adenopathy. No pneumothorax. No bone lesions.  IMPRESSION: No edema or consolidation. Electronically Signed   By: Bretta Bang III M.D.   On: 05/04/2016 14:56   Dg Hip Unilat With Pelvis 1v Right  05/04/2016  CLINICAL DATA:  Pain following fall EXAM: DG HIP (WITH OR WITHOUT PELVIS) 1V RIGHT COMPARISON:  None. FINDINGS: Frontal pelvis as well as frontal and lateral right hip images were obtained. There is evidence suggesting impaction along the lateral subcapital region of the right femoral neck. No other evidence of fracture. No dislocation. Joint spaces appear normal. Bones are diffusely osteoporotic. IMPRESSION: Evidence of an impaction type injury along the subcapital femoral neck on the right. No other evidence of fracture. No dislocation. No appreciable arthropathic change. Bones diffusely osteoporotic. Electronically Signed   By: Bretta Bang III  M.D.   On: 05/04/2016 14:57   I have personally reviewed and evaluated these images and lab results as part of my medical decision-making.   EKG Interpretation   Date/Time:  Friday May 04 2016 15:16:40 EDT Ventricular Rate:  67 PR Interval:  142 QRS Duration: 84 QT Interval:  419 QTC Calculation: 442 R Axis:   29 Text Interpretation:  Normal sinus rhythm No significant change since last  tracing Confirmed by Keani Gotcher MD, Duwayne HeckANIELLE [phone] on 05/04/2016 3:21:10 PM      MDM   Final diagnoses:  Hip pain  Closed right hip fracture, initial encounter (HCC)  Hyperglycemia    Discussed with Alphonsa OverallBrad Dixon, PA [facility].  States Dr. Marisa SprinklesSwintck on call.  Patient not likely to or before Sunday.  I discussed that family wished [facility], but Mr. Durwin NoraDixon states that will not be possible.  Discussed with Dr. Toniann FailKakrakandy and states will see and will be on Dr.Dhungel's service.   1- right hip fracture 2- dementia 3- hyperglycemia     Margarita Grizzleanielle Bridgid Printz, MD 05/04/16 1734

## 2016-05-04 NOTE — Consult Note (Signed)
Reason for Consult:Broken Right Hip Referring Physician: EDP  [name] is an 81 y.o. female.  HPI: 81 yo female with dementia who fell three days ago at the nursing home where she is a resident.  Patient complaining of persistent pain in the right leg and unable to bear full weight on it.  Patient presented to [facility] for further evaluation.  Due to lack of ortho coverage on call, patient transferred to [facility] for definitive care. Of note the patient has had a recent positive blood culture in the last week for Gram pos cocci.  The patient is a poor historian  Past Medical History  Diagnosis Date  . Diabetes mellitus   . Hypertension   . Dementia   . Anxiety   . Hypercholesteremia   . Thyroid disease   . Alzheimer disease     per facility paperwork dated 05/04/2016    Past Surgical History  Procedure Laterality Date  . Abdominal hysterectomy    . Cholecystectomy      Family History  Problem Relation Age of Onset  . Hypertension Other     Social History:  reports that she has quit smoking. She does not have any smokeless tobacco history on file. She reports that she does not drink alcohol or use illicit drugs.  Allergies:  Allergies  Allergen Reactions  . Penicillins Nausea And Vomiting  . Promethazine Hcl Nausea And Vomiting  . Sulfonamide Derivatives Other (See Comments)    unknown    Medications: I have reviewed the patient's current medications.  Results for orders placed or performed during the hospital encounter of 05/04/16 (from the past 48 hour(s))  APTT     Status: None   Collection Time: 05/04/16  2:20 PM  Result Value Ref Range   aPTT 29 24 - 37 seconds  Comprehensive metabolic panel     Status: Abnormal   Collection Time: 05/04/16  2:20 PM  Result Value Ref Range   Sodium 140 135 - 145 mmol/L   Potassium 3.4 (L) 3.5 - 5.1 mmol/L   Chloride 109 101 - 111 mmol/L   CO2 23 22 - 32 mmol/L   Glucose, Bld 231 (H) 65 - 99 mg/dL   BUN 13 6 - 20 mg/dL   Creatinine, Ser 0.70 0.44 - 1.00 mg/dL   Calcium 9.3 8.9 - 10.3 mg/dL   Total Protein 6.4 (L) 6.5 - 8.1 g/dL   Albumin 3.3 (L) 3.5 - 5.0 g/dL   AST 17 15 - 41 U/L   ALT 17 14 - 54 U/L   Alkaline Phosphatase 69 38 - 126 U/L   Total Bilirubin 0.6 0.3 - 1.2 mg/dL   GFR calc non Af Amer >60 >60 mL/min   GFR calc Af Amer >60 >60 mL/min    Comment: (NOTE) The eGFR has been calculated using the CKD EPI equation. This calculation has not been validated in all clinical situations. eGFR's persistently <60 mL/min signify possible Chronic Kidney Disease.    Anion gap 8 5 - 15  CBC WITH DIFFERENTIAL     Status: Abnormal   Collection Time: 05/04/16  2:20 PM  Result Value Ref Range   WBC 7.3 4.0 - 10.5 K/uL   RBC 4.01 3.87 - 5.11 MIL/uL   Hemoglobin 12.2 12.0 - 15.0 g/dL   HCT 34.9 (L) 36.0 - 46.0 %   MCV 87.0 78.0 - 100.0 fL   MCH 30.4 26.0 - 34.0 pg   MCHC 35.0 30.0 - 36.0 g/dL  RDW 12.6 11.5 - 15.5 %   Platelets 170 150 - 400 K/uL   Neutrophils Relative % 67 %   Neutro Abs 5.0 1.7 - 7.7 K/uL   Lymphocytes Relative 22 %   Lymphs Abs 1.6 0.7 - 4.0 K/uL   Monocytes Relative 7 %   Monocytes Absolute 0.5 0.1 - 1.0 K/uL   Eosinophils Relative 3 %   Eosinophils Absolute 0.2 0.0 - 0.7 K/uL   Basophils Relative 1 %   Basophils Absolute 0.1 0.0 - 0.1 K/uL  Protime-INR     Status: None   Collection Time: 05/04/16  2:20 PM  Result Value Ref Range   Prothrombin Time 14.4 11.6 - 15.2 seconds   INR 1.10 0.00 - 1.49  Type and screen [facility]     Status: None   Collection Time: 05/04/16  2:20 PM  Result Value Ref Range   ABO/RH(D) O POS    Antibody Screen NEG    Sample Expiration 05/07/2016   Urinalysis, Routine w reflex microscopic (not at [facility])     Status: None   Collection Time: 05/04/16  3:17 PM  Result Value Ref Range   Color, Urine YELLOW YELLOW   APPearance CLEAR CLEAR   Specific Gravity, Urine 1.010 1.005 - 1.030   pH 6.5 5.0 - 8.0   Glucose, UA NEGATIVE NEGATIVE mg/dL    Hgb urine dipstick NEGATIVE NEGATIVE   Bilirubin Urine NEGATIVE NEGATIVE   Ketones, ur NEGATIVE NEGATIVE mg/dL   Protein, ur NEGATIVE NEGATIVE mg/dL   Nitrite NEGATIVE NEGATIVE   Leukocytes, UA NEGATIVE NEGATIVE    Comment: MICROSCOPIC NOT DONE ON URINES WITH NEGATIVE PROTEIN, BLOOD, LEUKOCYTES, NITRITE, OR GLUCOSE <1000 mg/dL.    Dg Chest 1 View  05/04/2016  CLINICAL DATA:  Pain following fall 3 days prior EXAM: CHEST 1 VIEW COMPARISON:  April 24, 2016 FINDINGS: There is no edema or consolidation. Heart is upper normal in size with pulmonary vascularity within normal limits. No adenopathy. No pneumothorax. No bone lesions. IMPRESSION: No edema or consolidation. Electronically Signed   By: Lowella Grip III M.D.   On: 05/04/2016 14:56   Dg Hip Unilat With Pelvis 1v Right  05/04/2016  CLINICAL DATA:  Pain following fall EXAM: DG HIP (WITH OR WITHOUT PELVIS) 1V RIGHT COMPARISON:  None. FINDINGS: Frontal pelvis as well as frontal and lateral right hip images were obtained. There is evidence suggesting impaction along the lateral subcapital region of the right femoral neck. No other evidence of fracture. No dislocation. Joint spaces appear normal. Bones are diffusely osteoporotic. IMPRESSION: Evidence of an impaction type injury along the subcapital femoral neck on the right. No other evidence of fracture. No dislocation. No appreciable arthropathic change. Bones diffusely osteoporotic. Electronically Signed   By: Lowella Grip III M.D.   On: 05/04/2016 14:57    ROS Blood pressure 157/72, pulse 69, temperature 98.3 F (36.8 C), temperature source Oral, resp. rate 9, height _0  (1.575 m), weight 65.772 kg (145 lb), SpO2 98 %. Physical Exam Patient is alert and oriented to person.  Neck non tender with normal AROM.  Chest non tender.  Bilateral UEs to include clavicles non tender no deformity and normal AROM and strength. Sensation intact bilateral UE and LE,  Right LE painful logroll. Well  perfused distally and able to wiggle the toes on the right foot.  Left Leg no pain with AROM of the hip knee and ankle  Assessment/Plan: Right valgus impacted femoral neck fracture.  Patient admitted  to medicine for thorough medical check and clearance.  ? Unsure of the source for positive blood culture. I have spoken with Dr Rod Can who plans surgery for the hip on Sunday. NPO after MN Saturday night  Juwaun Inskeep,STEVEN R 05/04/2016, 9:01 PM

## 2016-05-04 NOTE — ED Notes (Signed)
Smiling, confused awaiting transfer  To [facility] via [facility]

## 2016-05-04 NOTE — ED Notes (Signed)
Daughter Laure KidneyDebra Weaver updated with admission to [facility].  Family concerned about the care she will receive. 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 [facility]ed [facility]. Family reassured that her care at [facility] will be excellent also made aware if she has any concerns to reach out to the charge nurse or house supervisor.    Home: [phone] Cell : [phone]

## 2016-05-04 NOTE — H&P (Signed)
History and Physical    Arloa KohBeverly J Bahr ZOX:[id number] DOB: 02/05/1935 DOA: 05/04/2016  Referring MD/NP/PA: Dr. Rosalia Hammersay. PCP: Galvin ProfferHAGUE, IMRAN P, MD  Outpatient Specialists: None. Patient coming from: Nursing home.  Chief Complaint: Right hip pain.  HPI: Tara PerkingBeverly J [facility] is a 81 y.o. female with medical history significant of dementia, hypertension diabetes mellitus and hyperlipidemia was brought to the ER the patient was complaining of right hip pain. Patient was originally brought to the ER 10 days ago after a fall and at that time CT head was unremarkable was seen to have UTI and was treated for UTI. Patient eventually started complaining of right hip pain over the last few days and as per the daughter patient did not have any other fall. X-rays revealed right hip fracture and [place] [facility] was consulted and [facility]. Patient's daughter is agreeable to transfer. As per patient's daughter who provided most of the history patient did not have any nausea vomiting abdominal pain diarrhea chest pain or shortness of breath.   ED Course: X-rays revealed right hip fracture.  Review of Systems: As per HPI otherwise 10 point review of systems negative.    Past Medical History  Diagnosis Date  . Diabetes mellitus   . Hypertension   . Dementia   . Anxiety   . Hypercholesteremia   . Thyroid disease   . Alzheimer disease     per facility paperwork dated 05/04/2016    Past Surgical History  Procedure Laterality Date  . Abdominal hysterectomy    . Cholecystectomy       reports that she has quit smoking. She does not have any smokeless tobacco history on file. She reports that she does not drink alcohol or use illicit drugs.  Allergies  Allergen Reactions  . Penicillins Nausea And Vomiting  . Promethazine Hcl Nausea And Vomiting  . Sulfonamide Derivatives Other (See Comments)    unknown    Family History  Problem Relation Age of Onset  .  Hypertension Other     Prior to Admission medications   Medication Sig Start Date End Date Taking? Authorizing Provider  acetaminophen (TYLENOL) 325 MG tablet Take 650 mg by mouth 3 (three) times daily.   Yes Historical Provider, MD  amLODipine (NORVASC) 5 MG tablet Take 5 mg by mouth daily.   Yes Historical Provider, MD  Artificial Tear Solution (SOOTHE XP OP) Apply 1 drop to eye 2 (two) times daily.   Yes Historical Provider, MD  cholecalciferol (VITAMIN D) 1000 UNITS tablet Take 1,000 Units by mouth daily.   Yes Historical Provider, MD  ciprofloxacin (CIPRO) 500 MG tablet Take 1 tablet (500 mg total) by mouth 2 (two) times daily. 04/24/16  Yes Vanetta MuldersScott Zackowski, MD  citalopram (CELEXA) 20 MG tablet Take 20 mg by mouth daily.   Yes Historical Provider, MD  Difluprednate (DUREZOL) 0.05 % EMUL Apply 1 drop to eye 2 (two) times daily.   Yes Historical Provider, MD  donepezil (ARICEPT) 10 MG tablet Take 10 mg by mouth at bedtime.   Yes Historical Provider, MD  ibuprofen (ADVIL,MOTRIN) 800 MG tablet Take 800 mg by mouth 2 (two) times daily.   Yes Historical Provider, MD  levothyroxine (SYNTHROID, LEVOTHROID) 25 MCG tablet Take 25 mcg by mouth daily before breakfast.   Yes Historical Provider, MD  lisinopril (PRINIVIL,ZESTRIL) 40 MG tablet Take 40 mg by mouth daily.   Yes Historical Provider, MD  Memantine HCl ER (NAMENDA XR) 28 MG CP24  Take 28 mg by mouth daily.   Yes Historical Provider, MD  metFORMIN (GLUCOPHAGE) 500 MG tablet Take 250 mg by mouth 2 (two) times daily with a meal.   Yes Historical Provider, MD  metoprolol tartrate (LOPRESSOR) 25 MG tablet Take 25 mg by mouth 2 (two) times daily.   Yes Historical Provider, MD  pravastatin (PRAVACHOL) 40 MG tablet Take 40 mg by mouth daily.   Yes Historical Provider, MD  topiramate (TOPAMAX) 50 MG tablet Take 50 mg by mouth 2 (two) times daily.   Yes Historical Provider, MD  acetaminophen (TYLENOL) 500 MG tablet Take 1,000 mg by mouth every 6 (six)  hours as needed.    Historical Provider, MD  cloNIDine (CATAPRES) 0.1 MG tablet Take 0.1 mg by mouth 4 (four) times daily as needed (*Administered as needed for BP levels >160/90).    Historical Provider, MD  loperamide (IMODIUM A-D) 2 MG tablet Take 2 mg by mouth 4 (four) times daily as needed for diarrhea or loose stools.    Historical Provider, MD  magnesium oxide (MAG-OX) 400 MG tablet Take 400 mg by mouth 2 (two) times daily.    Historical Provider, MD  meloxicam (MOBIC) 15 MG tablet Take 15 mg by mouth daily.    Historical Provider, MD  oxyCODONE-acetaminophen (PERCOCET/ROXICET) 5-325 MG per tablet Take 1 tablet by mouth every 6 (six) hours as needed. Patient not taking: Reported on 06/14/2015 12/26/14   Bethann Berkshire, MD    Physical Exam: Filed Vitals:   05/04/16 1710 05/04/16 1730 05/04/16 1800 05/04/16 1830  BP: 146/70 176/78 173/75 158/78  Pulse: 78   80  Temp:    98.4 F (36.9 C)  TempSrc:    Oral  Resp: Height:      Weight:      SpO2: 100%  99% 98%      Constitutional: Appears normal not in distress. Filed Vitals:   05/04/16 1710 05/04/16 1730 05/04/16 1800 05/04/16 1830  BP: 146/70 176/78 173/75 158/78  Pulse: 78   80  Temp:    98.4 F (36.9 C)  TempSrc:    Oral  Resp: Height:      Weight:      SpO2: 100%  99% 98%   Eyes: Anicteric no pallor. ENMT: No discharge from the ears eyes nose or mouth. Neck: No neck rigidity no mass felt. Respiratory: Bilateral air entry present no rhonchi or crepitation. Cardiovascular: S1 and S2 heard. Abdomen: Soft nontender bowel sounds present. Musculoskeletal: Pain on moving right hip. Skin: No rash. Neurologic: Patient is demented and oriented to name only. Moves all extremities. Psychiatric: Patient is demented.   Labs on Admission: I have personally reviewed following labs and imaging studies  CBC:  Recent Labs Lab 05/04/16 1420  WBC 7.3  NEUTROABS 5.0  HGB 12.2  HCT 34.9*  MCV 87.0    PLT 170   Basic Metabolic Panel:  Recent Labs Lab 05/04/16 1420  NA 140  K 3.4*  CL 109  CO2 23  GLUCOSE 231*  BUN 13  CREATININE 0.70  CALCIUM 9.3   GFR: Estimated Creatinine Clearance: 51.6 mL/min (by C-G formula based on Cr of 0.7). Liver Function Tests:  Recent Labs Lab 05/04/16 1420  AST 17  ALT 17  ALKPHOS 69  BILITOT 0.6  PROT 6.4*  ALBUMIN 3.3*   No results for input(s): LIPASE, AMYLASE in the last 168 hours. No results for input(s): AMMONIA in the  last 168 hours. Coagulation Profile:  Recent Labs Lab 05/04/16 1420  INR 1.10   Cardiac Enzymes: No results for input(s): CKTOTAL, CKMB, CKMBINDEX, TROPONINI in the last 168 hours. BNP (last 3 results) No results for input(s): PROBNP in the last 8760 hours. HbA1C: No results for input(s): HGBA1C in the last 72 hours. CBG: No results for input(s): GLUCAP in the last 168 hours. Lipid Profile: No results for input(s): CHOL, HDL, LDLCALC, TRIG, CHOLHDL, LDLDIRECT in the last 72 hours. Thyroid Function Tests: No results for input(s): TSH, T4TOTAL, FREET4, T3FREE, THYROIDAB in the last 72 hours. Anemia Panel: No results for input(s): VITAMINB12, FOLATE, FERRITIN, TIBC, IRON, RETICCTPCT in the last 72 hours. Urine analysis:    Component Value Date/Time   COLORURINE YELLOW 05/04/2016 1517   APPEARANCEUR CLEAR 05/04/2016 1517   LABSPEC 1.010 05/04/2016 1517   PHURINE 6.5 05/04/2016 1517   GLUCOSEU NEGATIVE 05/04/2016 1517   HGBUR NEGATIVE 05/04/2016 1517   HGBUR negative 12/19/2010 1223   BILIRUBINUR NEGATIVE 05/04/2016 1517   KETONESUR NEGATIVE 05/04/2016 1517   PROTEINUR NEGATIVE 05/04/2016 1517   UROBILINOGEN 0.2 06/14/2015 2048   NITRITE NEGATIVE 05/04/2016 1517   LEUKOCYTESUR NEGATIVE 05/04/2016 1517   Sepsis Labs: @LABRCNTIP (procalcitonin:4,lacticidven:4) ) Recent Results (from the past 240 hour(s))  Culture, blood (Routine X 2) w Reflex to ID Panel     Status: Abnormal   Collection Time:  04/24/16  8:26 PM  Result Value Ref Range Status   Specimen Description BLOOD LEFT ARM  Final   Special Requests BOTTLES DRAWN AEROBIC AND ANAEROBIC 6CC EACH  Final   Culture  Setup Time   Final    GRAM POSITIVE COCCI IN CLUSTERS RECOVERD FROM THE AEROBIC  BOTTLE Gram Stain Report Called to,Read Back By and Verified With: FLOW MANAGER SHERIDA AT 2115 ON [id number] BY Berton Lan K Performed at [facility]    Culture (A)  Final    STAPHYLOCOCCUS SPECIES (COAGULASE NEGATIVE) THE SIGNIFICANCE OF ISOLATING THIS ORGANISM FROM A SINGLE SET OF BLOOD CULTURES WHEN MULTIPLE SETS ARE DRAWN IS UNCERTAIN. PLEASE NOTIFY THE MICROBIOLOGY DEPARTMENT WITHIN ONE WEEK IF SPECIATION AND SENSITIVITIES ARE REQUIRED. Performed at [facility]    Report Status 04/27/2016 FINAL  Final  Culture, blood (Routine X 2) w Reflex to ID Panel     Status: None   Collection Time: 04/24/16  8:30 PM  Result Value Ref Range Status   Specimen Description BLOOD LEFT ARM  Final   Special Requests BOTTLES DRAWN AEROBIC AND ANAEROBIC [facility] EACH  Final   Culture NO GROWTH 5 DAYS  Final   Report Status 04/29/2016 FINAL  Final     Radiological Exams on Admission: Dg Chest 1 View  05/04/2016  CLINICAL DATA:  Pain following fall 3 days prior EXAM: CHEST 1 VIEW COMPARISON:  April 24, 2016 FINDINGS: There is no edema or consolidation. Heart is upper normal in size with pulmonary vascularity within normal limits. No adenopathy. No pneumothorax. No bone lesions. IMPRESSION: No edema or consolidation. Electronically Signed   By: Bretta Bang III M.D.   On: 05/04/2016 14:56   Dg Hip Unilat With Pelvis 1v Right  05/04/2016  CLINICAL DATA:  Pain following fall EXAM: DG HIP (WITH OR WITHOUT PELVIS) 1V RIGHT COMPARISON:  None. FINDINGS: Frontal pelvis as well as frontal and lateral right hip images were obtained. There is evidence suggesting impaction along the lateral subcapital region of the right femoral neck. No other evidence of  fracture. No dislocation. Joint spaces appear  normal. Bones are diffusely osteoporotic. IMPRESSION: Evidence of an impaction type injury along the subcapital femoral neck on the right. No other evidence of fracture. No dislocation. No appreciable arthropathic change. Bones diffusely osteoporotic. Electronically Signed   By: Bretta Bang III M.D.   On: 05/04/2016 14:57    EKG: Independently reviewed. Normal sinus rhythm.  Assessment/Plan Principal Problem:   Closed right hip fracture (HCC) Active Problems:   Hypothyroidism   Essential hypertension   Type 2 diabetes mellitus with hyperglycemia (HCC)   Dementia    #1. Right hip fracture - patient given her comorbidities will be at moderate risk for intermediate risk procedure. Orthopedics is planning to do surgery on 05/06/2016. Patient will be receiving Lovenox tonight and tomorrow morning but should be stopped 24 hours prior to surgery. Continue with pain relief medications. Keep patient nothing by mouth after midnight tomorrow. I did discuss with [facility] orthopedics PA Mr. Alphonsa Overall about the plan and DVT prophylaxis. #2. Hypertension - continue metoprolol amlodipine and lisinopril. #3. Diabetes mellitus type 2 - hold metformin while inpatient and place patient on sliding scale coverage. #4. Dementia on Aricept. #5. Hypothyroidism on Synthroid. #6. Hyperlipidemia on statins.   DVT prophylaxis: Lovenox for now but will need to be discontinued tomorrow 24 hours prior to surgery. Code Status: DO NOT RESUSCITATE confirmed with daughter.  Family Communication: Daughter.  Disposition Plan: Nursing home.  Consults called: Orthopedics Mr. Alphonsa Overall.  Admission status: Inpatient. 3-4 days. MedSurg bed.    Eduard Clos MD [facility] Pager [phone].  If 7PM-7AM, please contact night-coverage [email or web address] Password [facility]  05/04/2016, 6:57 PM

## 2016-05-05 DIAGNOSIS — E038 Other specified hypothyroidism: Secondary | ICD-10-CM

## 2016-05-05 LAB — CBC WITH DIFFERENTIAL/PLATELET
Basophils Absolute: 0 10*3/uL (ref 0.0–0.1)
Basophils Relative: 1 %
Eosinophils Absolute: 0.2 10*3/uL (ref 0.0–0.7)
Eosinophils Relative: 4 %
HEMATOCRIT: 34.2 % — AB (ref 36.0–46.0)
Hemoglobin: 11.6 g/dL — ABNORMAL LOW (ref 12.0–15.0)
LYMPHS PCT: 34 %
Lymphs Abs: 2 10*3/uL (ref 0.7–4.0)
MCH: 29.7 pg (ref 26.0–34.0)
MCHC: 33.9 g/dL (ref 30.0–36.0)
MCV: 87.5 fL (ref 78.0–100.0)
MONO ABS: 0.6 10*3/uL (ref 0.1–1.0)
MONOS PCT: 10 %
NEUTROS ABS: 3 10*3/uL (ref 1.7–7.7)
Neutrophils Relative %: 51 %
Platelets: 178 10*3/uL (ref 150–400)
RBC: 3.91 MIL/uL (ref 3.87–5.11)
RDW: 12.9 % (ref 11.5–15.5)
WBC: 5.9 10*3/uL (ref 4.0–10.5)

## 2016-05-05 LAB — CREATININE, SERUM: CREATININE: 0.72 mg/dL (ref 0.44–1.00)

## 2016-05-05 LAB — COMPREHENSIVE METABOLIC PANEL
ALBUMIN: 2.9 g/dL — AB (ref 3.5–5.0)
ALT: 49 U/L (ref 14–54)
ANION GAP: 13 (ref 5–15)
AST: 69 U/L — ABNORMAL HIGH (ref 15–41)
Alkaline Phosphatase: 64 U/L (ref 38–126)
BUN: 13 mg/dL (ref 6–20)
CHLORIDE: 105 mmol/L (ref 101–111)
CO2: 21 mmol/L — AB (ref 22–32)
Calcium: 9.2 mg/dL (ref 8.9–10.3)
Creatinine, Ser: 0.78 mg/dL (ref 0.44–1.00)
GFR calc Af Amer: >60 mL/min (ref >60)
GFR calc non Af Amer: >60 mL/min (ref >60)
GLUCOSE: 157 mg/dL — AB (ref 65–99)
POTASSIUM: 3.5 mmol/L (ref 3.5–5.1)
SODIUM: 139 mmol/L (ref 135–145)
Total Bilirubin: 0.9 mg/dL (ref 0.3–1.2)
Total Protein: 5.9 g/dL — ABNORMAL LOW (ref 6.5–8.1)

## 2016-05-05 LAB — SURGICAL PCR SCREEN
MRSA, PCR: NEGATIVE
STAPHYLOCOCCUS AUREUS: NEGATIVE

## 2016-05-05 LAB — GLUCOSE, CAPILLARY
GLUCOSE-CAPILLARY: 156 mg/dL — AB (ref 65–99)
GLUCOSE-CAPILLARY: 134 mg/dL — AB (ref 65–99)
Glucose-Capillary: 93 mg/dL (ref 65–99)
Glucose-Capillary: 145 mg/dL — ABNORMAL HIGH (ref 65–99)

## 2016-05-05 LAB — TYPE AND SCREEN
ABO/RH(D): O POS
Antibody Screen: NEGATIVE

## 2016-05-05 MED ORDER — ACETAMINOPHEN 500 MG PO TABS: 500.0000 mg | ORAL_TABLET | Freq: Once | ORAL | Status: DC

## 2016-05-05 MED ORDER — ENSURE ENLIVE PO LIQD
237.0000 mL | Freq: Two times a day (BID) | ORAL | Status: DC
  Administered 2016-05-06 – 2016-05-09 (×5): 237 mL via ORAL

## 2016-05-05 MED ORDER — POLYETHYLENE GLYCOL 3350 17 G PO PACK
17.0000 g | PACK | Freq: Every day | ORAL | Status: DC
  Administered 2016-05-05 – 2016-05-09 (×4): 17 g via ORAL
  Filled 2016-05-05 (×4): qty 1

## 2016-05-05 MED ORDER — SENNOSIDES-DOCUSATE SODIUM 8.6-50 MG PO TABS
2.0000 | ORAL_TABLET | Freq: Every evening | ORAL | Status: DC | PRN
Start: 2016-05-05 — End: 2016-05-07

## 2016-05-05 MED ORDER — ACETAMINOPHEN 500 MG PO TABS
1000.0000 mg | ORAL_TABLET | Freq: Three times a day (TID) | ORAL | Status: DC
  Administered 2016-05-05 – 2016-05-09 (×10): 1000 mg via ORAL
  Filled 2016-05-05 (×10): qty 2

## 2016-05-05 MED ORDER — OXYCODONE HCL 5 MG PO TABS
5.0000 mg | ORAL_TABLET | Freq: Four times a day (QID) | ORAL | Status: DC | PRN
  Administered 2016-05-05 – 2016-05-06 (×2): 5 mg via ORAL
  Filled 2016-05-05 (×2): qty 1

## 2016-05-05 NOTE — Progress Notes (Signed)
Orthopedics Progress Note  Subjective: Patient reports continued moderate pain in the right hip Objective:  Filed Vitals:   05/04/16 2115 05/05/16 0431  BP: 150/62 125/59  Pulse:  58  Temp:  97.9 F (36.6 C)  Resp:  16    General: Awake and alert  Musculoskeletal: right hip with limited ROM due to pain, good perfusion distally Neurovascularly intact  Lab Results  Component Value Date   WBC 5.9 05/05/2016   HGB 11.6* 05/05/2016   HCT 34.2* 05/05/2016   MCV 87.5 05/05/2016   PLT 178 05/05/2016       Component Value Date/Time   NA 139 05/05/2016 0628   K 3.5 05/05/2016 0628   CL 105 05/05/2016 0628   CO2 21* 05/05/2016 0628   GLUCOSE 157* 05/05/2016 0628   BUN 13 05/05/2016 0628   CREATININE 0.78 05/05/2016 0628   CALCIUM 9.2 05/05/2016 0628   GFRNONAA >60 05/05/2016 0628   GFRAA >60 05/05/2016 0628    Lab Results  Component Value Date   INR 1.10 05/04/2016   INR 1.0 01/08/2009    Assessment/Plan:  s/p mechanical fall with acute right hip fracture Per Dr Linna CapriceSwinteck, surgery planned for tomorrow, bed rest for now NPO after MN DVT prophylaxis mechanical for now  Almedia BallsSteven R. Ranell PatrickNorris, MD 05/05/2016 7:59 AM

## 2016-05-05 NOTE — Progress Notes (Signed)
Spoke with patient's daughter last night around 2230. Her name is Jolly MangoDeborah Weaver, home tel: [phone], cell [phone]. Informed her that her mother arrived to [id number] from [facility] safely around 2100 last night.  She was also informed of patient's Difluprednate 0.05% emul eye solution.  Daughter will bring it today since Rx do not carry it.

## 2016-05-05 NOTE — Progress Notes (Signed)
PROGRESS NOTE        PATIENT DETAILS Name: Tara PerkingBeverly J Lloyd Age: 81 y.o. Sex: female Date of Birth: 09/09/1935 Admit Date: 05/04/2016 Admitting Physician Eddie NorthNishant Dhungel, MD WGN:FAOZHPCP:HAGUE, Myrene GalasIMRAN P, MD Outpatient Specialists: None  Brief Narrative: Patient is a 81 y.o. female of dementia, hypertension, type 2 diabetes who presented with right hip pain, further workup revealed right hip fracture.  Subjective: Pleasantly confused. But does continue to have right-sided hip pain.  Assessment/Plan: Principal Problem: Closed right hip fracture: Likely secondary to a mechanical fall that the patient sustained a few days prior to this admission at skilled nursing facility. Seen by orthopedics, plans are for hip repair on 5/7.  Active Problems: Hypertension: Controlled, continue metoprolol, amlodipine and lisinopril.  Type 2 diabetes: CBGs stable with SSI, continue to hold oral hypoglycemic agents.  Hypothyroidism: Continue Synthroid  Dyslipidemia: Continue statin  Dementia with mild delirium: Mostly pleasantly confused, continue Aricept.  DVT Prophylaxis: Prophylactic Lovenox  Code Status:  DNR  Family Communication: None at bedside  Disposition Plan: Remain inpatient-back to SNF on discharge  Antimicrobial agents: None  Procedures: None  CONSULTS:  orthopedic surgery  Time spent: 25 minutes-Greater than 50% of this time was spent in counseling, explanation of diagnosis, planning of further management, and coordination of care.  MEDICATIONS: Anti-infectives    None      Scheduled Meds: . acetaminophen  500 mg Oral Once  . amLODipine  5 mg Oral Daily  . cholecalciferol  1,000 Units Oral Daily  . citalopram  20 mg Oral Daily  . Difluprednate  1 drop Ophthalmic BID  . donepezil  10 mg Oral QHS  . enoxaparin (LOVENOX) injection  40 mg Subcutaneous Q24H  . insulin aspart  0-9 Units Subcutaneous TID WC  . levothyroxine  25 mcg Oral QAC  breakfast  . lisinopril  40 mg Oral Daily  . memantine  28 mg Oral Daily  . metoprolol tartrate  25 mg Oral BID  . pravastatin  40 mg Oral Daily  . topiramate  50 mg Oral BID   Continuous Infusions:  PRN Meds:.HYDROcodone-acetaminophen, morphine injection   PHYSICAL EXAM: Vital signs: Filed Vitals:   05/04/16 2115 05/05/16 0431 05/05/16 0918 05/05/16 1256  BP: 150/62 125/59 115/66 121/62  Pulse:  58 52 65  Temp:  97.9 F (36.6 C)  98 F (36.7 C)  TempSrc:  Oral    Resp:  16  14  Height:      Weight:      SpO2: 97% 96%  98%   Filed Weights   05/04/16 1330 05/04/16 2112  Weight: 65.772 kg (145 lb) 60.8 kg (134 lb 0.6 oz)   Body mass index is 26.18 kg/(m^2).   Gen Exam: Awake, Somewhat alert but mostly pleasantly confused. Speech appears clear, she does not appear to be any distress. She looks chronically ill. Neck: Supple, No JVD.   Chest: B/L Clear.   CVS: S1 S2 Regular Abdomen: soft, BS +, non tender, non distended.  Extremities: no edema, lower extremities warm to touch. Neurologic: Non Focal.   Skin: No Rash or lesions  Wounds: N/A.    LABORATORY DATA: CBC:  Recent Labs Lab 05/04/16 1420 05/04/16 2248 05/05/16 0628  WBC 7.3 7.4 5.9  NEUTROABS 5.0  --  3.0  HGB 12.2 12.0 11.6*  HCT 34.9* 35.3* 34.2*  MCV 87.0 86.1  87.5  PLT 170 193 178    Basic Metabolic Panel:  Recent Labs Lab 05/04/16 1420 05/04/16 2248 05/05/16 0628  NA 140  --  139  K 3.4*  --  3.5  CL 109  --  105  CO2 23  --  21*  GLUCOSE 231*  --  157*  BUN 13  --  13  CREATININE 0.70 0.72 0.78  CALCIUM 9.3  --  9.2    GFR: Estimated Creatinine Clearance: 47.2 mL/min (by C-G formula based on Cr of 0.78).  Liver Function Tests:  Recent Labs Lab 05/04/16 1420 05/05/16 0628  AST 17 69*  ALT 17 49  ALKPHOS 69 64  BILITOT 0.6 0.9  PROT 6.4* 5.9*  ALBUMIN 3.3* 2.9*   No results for input(s): LIPASE, AMYLASE in the last 168 hours. No results for input(s): AMMONIA in the last  168 hours.  Coagulation Profile:  Recent Labs Lab 05/04/16 1420  INR 1.10    Cardiac Enzymes: No results for input(s): CKTOTAL, CKMB, CKMBINDEX, TROPONINI in the last 168 hours.  BNP (last 3 results) No results for input(s): PROBNP in the last 8760 hours.  HbA1C: No results for input(s): HGBA1C in the last 72 hours.  CBG:  Recent Labs Lab 05/04/16 2156 05/05/16 0617 05/05/16 1223  GLUCAP 153* 156* 134*    Lipid Profile: No results for input(s): CHOL, HDL, LDLCALC, TRIG, CHOLHDL, LDLDIRECT in the last 72 hours.  Thyroid Function Tests: No results for input(s): TSH, T4TOTAL, FREET4, T3FREE, THYROIDAB in the last 72 hours.  Anemia Panel: No results for input(s): VITAMINB12, FOLATE, FERRITIN, TIBC, IRON, RETICCTPCT in the last 72 hours.  Urine analysis:    Component Value Date/Time   COLORURINE YELLOW 05/04/2016 1517   APPEARANCEUR CLEAR 05/04/2016 1517   LABSPEC 1.010 05/04/2016 1517   PHURINE 6.5 05/04/2016 1517   GLUCOSEU NEGATIVE 05/04/2016 1517   HGBUR NEGATIVE 05/04/2016 1517   HGBUR negative 12/19/2010 1223   BILIRUBINUR NEGATIVE 05/04/2016 1517   KETONESUR NEGATIVE 05/04/2016 1517   PROTEINUR NEGATIVE 05/04/2016 1517   UROBILINOGEN 0.2 06/14/2015 2048   NITRITE NEGATIVE 05/04/2016 1517   LEUKOCYTESUR NEGATIVE 05/04/2016 1517    Sepsis Labs: Lactic Acid, Venous    Component Value Date/Time   LATICACIDVEN 1.49 04/24/2016 2044    MICROBIOLOGY: Recent Results (from the past 240 hour(s))  Urine culture     Status: None (Preliminary result)   Collection Time: 05/04/16  3:17 PM  Result Value Ref Range Status   Specimen Description URINE, CATHETERIZED  Final   Special Requests NONE  Final   Culture   Final    NO GROWTH < 24 HOURS Performed at [facility]    Report Status PENDING  Incomplete  Surgical PCR screen     Status: None   Collection Time: 05/05/16  6:17 AM  Result Value Ref Range Status   MRSA, PCR NEGATIVE NEGATIVE Final    Staphylococcus aureus NEGATIVE NEGATIVE Final    Comment:        The Xpert SA Assay (FDA approved for NASAL specimens in patients over 40 years of age), is one component of a comprehensive surveillance program.  Test performance has been validated by [facility] for patients greater than or equal to 85 year old. It is not intended to diagnose infection nor to guide or monitor treatment.     RADIOLOGY STUDIES/RESULTS: Dg Chest 1 View  05/04/2016  CLINICAL DATA:  Pain following fall 3 days prior EXAM: CHEST 1 VIEW  COMPARISON:  April 24, 2016 FINDINGS: There is no edema or consolidation. Heart is upper normal in size with pulmonary vascularity within normal limits. No adenopathy. No pneumothorax. No bone lesions. IMPRESSION: No edema or consolidation. Electronically Signed   By: Bretta Bang III M.D.   On: 05/04/2016 14:56   Dg Chest 2 View  04/24/2016  CLINICAL DATA:  Acute onset of altered mental status. Initial encounter. EXAM: CHEST  2 VIEW COMPARISON:  Chest radiograph from 12/07/2015 FINDINGS: The lungs are well-aerated. There is mild elevation of the right hemidiaphragm. Mild peribronchial thickening is noted. There is no evidence of focal opacification, pleural effusion or pneumothorax. The heart is borderline normal in size. No acute osseous abnormalities are seen. IMPRESSION: Mild elevation of the right hemidiaphragm. Mild peribronchial thickening noted. Electronically Signed   By: Roanna Raider M.D.   On: 04/24/2016 20:08   Ct Head Wo Contrast  04/24/2016  CLINICAL DATA:  Altered mental status EXAM: CT HEAD WITHOUT CONTRAST TECHNIQUE: Contiguous axial images were obtained from the base of the skull through the vertex without intravenous contrast. COMPARISON:  12/07/2015 FINDINGS: Bony calvarium is intact. Diffuse atrophic changes are noted. No findings to suggest acute hemorrhage, acute infarction or space-occupying mass lesion are noted. IMPRESSION: Atrophic changes without  acute abnormality. Electronically Signed   By: Alcide Clever M.D.   On: 04/24/2016 20:06   Dg Hip Unilat With Pelvis 1v Right  05/04/2016  CLINICAL DATA:  Pain following fall EXAM: DG HIP (WITH OR WITHOUT PELVIS) 1V RIGHT COMPARISON:  None. FINDINGS: Frontal pelvis as well as frontal and lateral right hip images were obtained. There is evidence suggesting impaction along the lateral subcapital region of the right femoral neck. No other evidence of fracture. No dislocation. Joint spaces appear normal. Bones are diffusely osteoporotic. IMPRESSION: Evidence of an impaction type injury along the subcapital femoral neck on the right. No other evidence of fracture. No dislocation. No appreciable arthropathic change. Bones diffusely osteoporotic. Electronically Signed   By: Bretta Bang III M.D.   On: 05/04/2016 14:57     LOS: 1 day   Jeoffrey Massed, MD  [facility] Pager:[phone]  If 7PM-7AM, please contact night-coverage [email or web address] Password [email or web address] 05/05/2016, 1:50 PM

## 2016-05-05 NOTE — Progress Notes (Signed)
Initial Nutrition Assessment  DOCUMENTATION CODES:  Not applicable  INTERVENTION:  Magic cup with Dinner, each supplement provides 290 kcal and 9 grams of protein  Postop, Ensure Enlive po BID, each supplement provides 350 kcal and 20 grams of protein  Per daughter, pt has history of chronic constipation. Last BM is unknown. Potentially could need bowel regimen.   NUTRITION DIAGNOSIS:  Increased nutrient needs related to wound healing (hip fracture) as evidenced by estimated nutritional requirements for this condition  GOAL:  Patient will meet greater than or equal to 90% of their needs   MONITOR:  PO intake, Supplement acceptance, Labs, Diet advancement  REASON FOR ASSESSMENT:  Consult Hip fracture protocol  ASSESSMENT:  81 y/o female PMHx HTN, dementia, DM, HLD, anxiety. Was brought to ER after complaining of left hip pain. Xray shows Hip fracture. Surgery planned tomorrow.   Pt only spoke very little. Her lunch tray is seen untouched at her bedside. Nurse reports pt has been unwilling to drink any more fluid then what was required to swallow medication. She is complaining of a sore mouth. She was agreeable to something cold to help sooth her mouth. Pt also states she still has hip pain.   Encouraged her to eat something for dinner as she will NPO after midnight.   Daughter states that despite the multiple documented weight measurements of 150 lbs from the past years, pt has never weighed that much. Her UBW is ~130 bws and she has been steady at that weight.  Potentially the patient, who has dementia, reported this as her weight and it has been carried from admission to admission.   Daughter reports since patient has been at her snf, she has not been on any sort of therapeutic or mechanically altered diet. She believes that pt has been fairly stable in terms of her PO intake with no recent decreases in appetite. Did mention that pt had 3 teeth pulled recently, but does not think  that has altered her ability to eat solid foods.  Pt did not take any vitamin or mineral supplements. Pt had been drinking Mighty shakes at the facility and daughter reports she liked them. Can order Ensure for post op if her sore mouth has resolved.   Pt apparently does struggle with chronic constipation and diarrhea. She did not know if patient took any laxatives/softeners.   Labs reviewed: Albumin: 2.9. Total Pro: 5.9, CBGs improving though could be due to poor po intake   Recent Labs Lab 05/04/16 1420 05/04/16 2248 05/05/16 0628  NA 140  --  139  K 3.4*  --  3.5  CL 109  --  105  CO2 23  --  21*  BUN 13  --  13  CREATININE 0.70 0.72 0.78  CALCIUM 9.3  --  9.2  GLUCOSE 231*  --  157*   Diet Order:  Diet heart healthy/carb modified Room service appropriate?: Yes; Fluid consistency:: Thin Diet NPO time specified Except for: Sips with Meds  Skin:  Reviewed, no issues  Last BM:  Unknown  Height:  Ht Readings from Last 1 Encounters:  05/04/16 5' (1.524 m)   Weight:  Wt Readings from Last 1 Encounters:  05/04/16 134 lb 0.6 oz (60.8 kg)   Wt Readings from Last 10 Encounters:  05/04/16 134 lb 0.6 oz (60.8 kg)  04/24/16 150 lb (68.04 kg)  12/07/15 150 lb (68.04 kg)  08/29/15 150 lb (68.04 kg)  06/14/15 150 lb (68.04 kg)  03/03/15 148 lb (  67.132 kg)  01/18/15 148 lb (67.132 kg)  01/03/15 148 lb (67.132 kg)  12/26/14 148 lb (67.132 kg)  09/24/11 148 lb (67.132 kg)   Ideal Body Weight:  45.45 kg  BMI:  Body mass index is 26.18 kg/(m^2).  Estimated Nutritional Needs:  Kcal:  1400-1600 (23-26 kcal/kg bw) Protein:  61-73 g (1-1.2 g/kg bw) Fluid:  1.4-1.6 liters fluid  EDUCATION NEEDS:  No education needs identified at this time  Christophe LouisNathan Jahzion Brogden RD, LDN Clinical Nutrition Pager: [phone] 05/05/2016 2:41 PM

## 2016-05-06 ENCOUNTER — Inpatient Hospital Stay (HOSPITAL_COMMUNITY): Payer: Medicare Other

## 2016-05-06 ENCOUNTER — Encounter (HOSPITAL_COMMUNITY): Payer: Self-pay | Admitting: Anesthesiology

## 2016-05-06 LAB — GLUCOSE, CAPILLARY
GLUCOSE-CAPILLARY: 221 mg/dL — AB (ref 65–99)
GLUCOSE-CAPILLARY: 226 mg/dL — AB (ref 65–99)
GLUCOSE-CAPILLARY: 185 mg/dL — AB (ref 65–99)
Glucose-Capillary: 142 mg/dL — ABNORMAL HIGH (ref 65–99)

## 2016-05-06 LAB — URINE CULTURE: CULTURE: NO GROWTH

## 2016-05-06 MED ORDER — FENTANYL CITRATE (PF) 250 MCG/5ML IJ SOLN
INTRAMUSCULAR | Status: AC
  Filled 2016-05-06: qty 5

## 2016-05-06 MED ORDER — ROCURONIUM BROMIDE 50 MG/5ML IV SOLN
INTRAVENOUS | Status: AC
  Filled 2016-05-06: qty 1

## 2016-05-06 MED ORDER — PROPOFOL 10 MG/ML IV BOLUS
INTRAVENOUS | Status: AC
  Filled 2016-05-06: qty 20

## 2016-05-06 MED ORDER — PANTOPRAZOLE SODIUM 40 MG PO TBEC
40.0000 mg | DELAYED_RELEASE_TABLET | Freq: Every day | ORAL | Status: DC
  Administered 2016-05-06 – 2016-05-09 (×3): 40 mg via ORAL
  Filled 2016-05-06 (×3): qty 1

## 2016-05-06 MED ORDER — TRANEXAMIC ACID 1000 MG/10ML IV SOLN
1000.0000 mg | INTRAVENOUS | Status: AC
  Administered 2016-05-07: 1000 mg via INTRAVENOUS
  Filled 2016-05-06: qty 10

## 2016-05-06 MED ORDER — PHENYLEPHRINE 40 MCG/ML (10ML) SYRINGE FOR IV PUSH (FOR BLOOD PRESSURE SUPPORT)
PREFILLED_SYRINGE | INTRAVENOUS | Status: AC
  Filled 2016-05-06: qty 10

## 2016-05-06 NOTE — Progress Notes (Signed)
Asked to assume care by Dr. Ranell PatrickNorris. Lives in ALF. Per daughter, patient is a minimal ambulator with a walker. Did receive lovenox last night. Plan for R hip hemiarthroplasty tomorrow. Discussed R/B/A with patient's daughter. NPO after MN; hold chemical DVT ppx.

## 2016-05-06 NOTE — Anesthesia Preprocedure Evaluation (Addendum)
Anesthesia Evaluation    Airway Mallampati: III  TM Distance: >3 FB Neck ROM: Full    Dental  (+) Poor Dentition, Missing, Chipped, Dental Advisory Given   Pulmonary former smoker,    breath sounds clear to auscultation       Cardiovascular hypertension, Pt. on medications and Pt. on home beta blockers (-) angina Rhythm:Regular Rate:Normal  '11 ECHO: EF 60-65%, valves OK   Neuro/Psych PSYCHIATRIC DISORDERS (Alzheimer's) Anxiety    GI/Hepatic   Endo/Other  diabetes (glu 216), Oral Hypoglycemic AgentsHypothyroidism   Renal/GU      Musculoskeletal   Abdominal   Peds  Hematology  (+) Blood dyscrasia (Hb 11.5), ,   Anesthesia Other Findings   Reproductive/Obstetrics                          Anesthesia Physical Anesthesia Plan  ASA: III  Anesthesia Plan: General   Post-op Pain Management:    Induction: Intravenous  Airway Management Planned: Oral ETT  Additional Equipment:   Intra-op Plan:   Post-operative Plan: Extubation in OR  Informed Consent: I have reviewed the patients History and Physical, chart, labs and discussed the procedure including the risks, benefits and alternatives for the proposed anesthesia with the patient or authorized representative who has indicated his/her understanding and acceptance.   Dental advisory given, Consent reviewed with POA and History available from chart only  Plan Discussed with: Surgeon and CRNA  Anesthesia Plan Comments: (Plan routine monitors, GETA Intra-operative suspension of DNR, possible transfusion Discussed with daughter, POA, Laure KidneyDebra Weaver by telephone  Pt cancelled by Dr. Linna CapriceSwinteck)       Anesthesia Quick Evaluation

## 2016-05-06 NOTE — Progress Notes (Signed)
PROGRESS NOTE        PATIENT DETAILS Name: [name] Age: 81 y.o. Sex: female Date of Birth: 1935-04-02 Admit Date: 05/04/2016 Admitting Physician Eddie North, MD [id number], Myrene Galas, MD Outpatient Specialists: None  Brief Narrative: Patient is a 81 y.o. female of dementia, hypertension, type 2 diabetes who presented with right hip pain, further workup revealed right hip fracture.  Subjective: Complains for pain in her legs and in her lower abd area.Seems to be pleasantly confused.   Assessment/Plan: Principal Problem: Closed right hip fracture: Likely secondary to a mechanical fall that the patient sustained a few days prior to this admission at skilled nursing facility. Seen by orthopedics, plans are for hip repair on 5/8.  Active Problems: ?Abd pain:start PPI-belly is soft-does have some mild diffuse tenderness in mid low abd area-but significantly less when distracted. Check Xray, start PPI and follow.  Hypertension: Controlled, continue metoprolol, amlodipine and lisinopril.  Type 2 diabetes: CBGs stable with SSI, continue to hold oral hypoglycemic agents.  Hypothyroidism: Continue Synthroid  Dyslipidemia: Continue statin  Dementia with mild delirium: Mostly pleasantly confused, continue Aricept.  DVT Prophylaxis: SCD's  Code Status:  DNR  Family Communication: None at bedside  Disposition Plan: Remain inpatient- SNF on discharge  Antimicrobial agents: None  Procedures: None  CONSULTS:  orthopedic surgery  Time spent: 25 minutes-Greater than 50% of this time was spent in counseling, explanation of diagnosis, planning of further management, and coordination of care.  MEDICATIONS: Anti-infectives    None      Scheduled Meds: . acetaminophen  1,000 mg Oral Q8H  . amLODipine  5 mg Oral Daily  . cholecalciferol  1,000 Units Oral Daily  . citalopram  20 mg Oral Daily  . donepezil  10 mg Oral QHS  . feeding  supplement (ENSURE ENLIVE)  237 mL Oral BID BM  . insulin aspart  0-9 Units Subcutaneous TID WC  . levothyroxine  25 mcg Oral QAC breakfast  . lisinopril  40 mg Oral Daily  . memantine  28 mg Oral Daily  . metoprolol tartrate  25 mg Oral BID  . pantoprazole  40 mg Oral Q1200  . polyethylene glycol  17 g Oral Daily  . pravastatin  40 mg Oral Daily  . topiramate  50 mg Oral BID   Continuous Infusions:  PRN Meds:.morphine injection, oxyCODONE, senna-docusate   PHYSICAL EXAM: Vital signs: Filed Vitals:   05/05/16 0918 05/05/16 1256 05/05/16 2200 05/06/16 0424  BP: 115/66 121/62 133/57 123/67  Pulse: 52 65 62 59  Temp:  98 F (36.7 C) 97.4 F (36.3 C) 97.5 F (36.4 C)  TempSrc:   Oral Oral  Resp:  Height:      Weight:      SpO2:  98% 93% 98%   Filed Weights   05/04/16 1330 05/04/16 2112  Weight: 65.772 kg (145 lb) 60.8 kg (134 lb 0.6 oz)   Body mass index is 26.18 kg/(m^2).   Gen Exam: Awake,somewhat confused-mild in mild distress due to pain . She looks chronically ill. Neck: Supple, No JVD.   Chest: B/L Clear.   CVS: S1 S2 Regular Abdomen: soft, BS +, mildly tender in lower abdomen, non distended.  Extremities: no edema, lower extremities warm to touch. Neurologic: Non Focal.   Skin: No Rash or lesions  Wounds: N/A.  LABORATORY DATA: CBC:  Recent Labs Lab 05/04/16 1420 05/04/16 2248 05/05/16 0628  WBC 7.3 7.4 5.9  NEUTROABS 5.0  --  3.0  HGB 12.2 12.0 11.6*  HCT 34.9* 35.3* 34.2*  MCV 87.0 86.1 87.5  PLT 170 193 178    Basic Metabolic Panel:  Recent Labs Lab 05/04/16 1420 05/04/16 2248 05/05/16 0628  NA 140  --  139  K 3.4*  --  3.5  CL 109  --  105  CO2 23  --  21*  GLUCOSE 231*  --  157*  BUN 13  --  13  CREATININE 0.70 0.72 0.78  CALCIUM 9.3  --  9.2    GFR: Estimated Creatinine Clearance: 47.2 mL/min (by C-G formula based on Cr of 0.78).  Liver Function Tests:  Recent Labs Lab 05/04/16 1420 05/05/16 0628  AST 17  69*  ALT 17 49  ALKPHOS 69 64  BILITOT 0.6 0.9  PROT 6.4* 5.9*  ALBUMIN 3.3* 2.9*   No results for input(s): LIPASE, AMYLASE in the last 168 hours. No results for input(s): AMMONIA in the last 168 hours.  Coagulation Profile:  Recent Labs Lab 05/04/16 1420  INR 1.10    Cardiac Enzymes: No results for input(s): CKTOTAL, CKMB, CKMBINDEX, TROPONINI in the last 168 hours.  BNP (last 3 results) No results for input(s): PROBNP in the last 8760 hours.  HbA1C: No results for input(s): HGBA1C in the last 72 hours.  CBG:  Recent Labs Lab 05/05/16 0617 05/05/16 1223 05/05/16 1657 05/05/16 2205 05/06/16 0610  GLUCAP 156* 134* 93 145* 221*    Lipid Profile: No results for input(s): CHOL, HDL, LDLCALC, TRIG, CHOLHDL, LDLDIRECT in the last 72 hours.  Thyroid Function Tests: No results for input(s): TSH, T4TOTAL, FREET4, T3FREE, THYROIDAB in the last 72 hours.  Anemia Panel: No results for input(s): VITAMINB12, FOLATE, FERRITIN, TIBC, IRON, RETICCTPCT in the last 72 hours.  Urine analysis:    Component Value Date/Time   COLORURINE YELLOW 05/04/2016 1517   APPEARANCEUR CLEAR 05/04/2016 1517   LABSPEC 1.010 05/04/2016 1517   PHURINE 6.5 05/04/2016 1517   GLUCOSEU NEGATIVE 05/04/2016 1517   HGBUR NEGATIVE 05/04/2016 1517   HGBUR negative 12/19/2010 1223   BILIRUBINUR NEGATIVE 05/04/2016 1517   KETONESUR NEGATIVE 05/04/2016 1517   PROTEINUR NEGATIVE 05/04/2016 1517   UROBILINOGEN 0.2 06/14/2015 2048   NITRITE NEGATIVE 05/04/2016 1517   LEUKOCYTESUR NEGATIVE 05/04/2016 1517    Sepsis Labs: Lactic Acid, Venous    Component Value Date/Time   LATICACIDVEN 1.49 04/24/2016 2044    MICROBIOLOGY: Recent Results (from the past 240 hour(s))  Urine culture     Status: None (Preliminary result)   Collection Time: 05/04/16  3:17 PM  Result Value Ref Range Status   Specimen Description URINE, CATHETERIZED  Final   Special Requests NONE  Final   Culture   Final    NO  GROWTH < 24 HOURS Performed at [facility]    Report Status PENDING  Incomplete  Surgical PCR screen     Status: None   Collection Time: 05/05/16  6:17 AM  Result Value Ref Range Status   MRSA, PCR NEGATIVE NEGATIVE Final   Staphylococcus aureus NEGATIVE NEGATIVE Final    Comment:        The Xpert SA Assay (FDA approved for NASAL specimens in patients over 60 years of age), is one component of a comprehensive surveillance program.  Test performance has been validated by [facility] for patients greater than  or equal to 79 year old. It is not intended to diagnose infection nor to guide or monitor treatment.     RADIOLOGY STUDIES/RESULTS: Dg Chest 1 View  05/04/2016  CLINICAL DATA:  Pain following fall 3 days prior EXAM: CHEST 1 VIEW COMPARISON:  April 24, 2016 FINDINGS: There is no edema or consolidation. Heart is upper normal in size with pulmonary vascularity within normal limits. No adenopathy. No pneumothorax. No bone lesions. IMPRESSION: No edema or consolidation. Electronically Signed   By: Bretta BangWilliam  Woodruff III M.D.   On: 05/04/2016 14:56   Dg Chest 2 View  04/24/2016  CLINICAL DATA:  Acute onset of altered mental status. Initial encounter. EXAM: CHEST  2 VIEW COMPARISON:  Chest radiograph from 12/07/2015 FINDINGS: The lungs are well-aerated. There is mild elevation of the right hemidiaphragm. Mild peribronchial thickening is noted. There is no evidence of focal opacification, pleural effusion or pneumothorax. The heart is borderline normal in size. No acute osseous abnormalities are seen. IMPRESSION: Mild elevation of the right hemidiaphragm. Mild peribronchial thickening noted. Electronically Signed   By: Roanna RaiderJeffery  Chang M.D.   On: 04/24/2016 20:08   Ct Head Wo Contrast  04/24/2016  CLINICAL DATA:  Altered mental status EXAM: CT HEAD WITHOUT CONTRAST TECHNIQUE: Contiguous axial images were obtained from the base of the skull through the vertex without intravenous  contrast. COMPARISON:  12/07/2015 FINDINGS: Bony calvarium is intact. Diffuse atrophic changes are noted. No findings to suggest acute hemorrhage, acute infarction or space-occupying mass lesion are noted. IMPRESSION: Atrophic changes without acute abnormality. Electronically Signed   By: Alcide CleverMark  Lukens M.D.   On: 04/24/2016 20:06   Dg Hip Unilat With Pelvis 1v Right  05/04/2016  CLINICAL DATA:  Pain following fall EXAM: DG HIP (WITH OR WITHOUT PELVIS) 1V RIGHT COMPARISON:  None. FINDINGS: Frontal pelvis as well as frontal and lateral right hip images were obtained. There is evidence suggesting impaction along the lateral subcapital region of the right femoral neck. No other evidence of fracture. No dislocation. Joint spaces appear normal. Bones are diffusely osteoporotic. IMPRESSION: Evidence of an impaction type injury along the subcapital femoral neck on the right. No other evidence of fracture. No dislocation. No appreciable arthropathic change. Bones diffusely osteoporotic. Electronically Signed   By: Bretta BangWilliam  Woodruff III M.D.   On: 05/04/2016 14:57     LOS: 2 days   Jeoffrey MassedGHIMIRE,SHANKER, MD  [facility] Pager:[phone]  If 7PM-7AM, please contact night-coverage [email or web address] Password [email or web address] 05/06/2016, 9:44 AM

## 2016-05-07 ENCOUNTER — Encounter (HOSPITAL_COMMUNITY)
Admission: EM | Disposition: A | Discharge status: Skilled Nursing Facility | Payer: Self-pay | Source: Home / Self Care | Attending: Internal Medicine

## 2016-05-07 ENCOUNTER — Inpatient Hospital Stay (HOSPITAL_COMMUNITY): Payer: Medicare Other | Admitting: Certified Registered Nurse Anesthetist

## 2016-05-07 ENCOUNTER — Inpatient Hospital Stay (HOSPITAL_COMMUNITY): Payer: Medicare Other

## 2016-05-07 ENCOUNTER — Encounter (HOSPITAL_COMMUNITY): Payer: Self-pay | Admitting: Certified Registered Nurse Anesthetist

## 2016-05-07 DIAGNOSIS — S72001A Fracture of unspecified part of neck of right femur, initial encounter for closed fracture: Secondary | ICD-10-CM | POA: Diagnosis present

## 2016-05-07 HISTORY — PX: ANTERIOR APPROACH HEMI HIP ARTHROPLASTY: SHX6690

## 2016-05-07 LAB — GLUCOSE, CAPILLARY
GLUCOSE-CAPILLARY: 147 mg/dL — AB (ref 65–99)
GLUCOSE-CAPILLARY: 187 mg/dL — AB (ref 65–99)
GLUCOSE-CAPILLARY: 196 mg/dL — AB (ref 65–99)
Glucose-Capillary: 216 mg/dL — ABNORMAL HIGH (ref 65–99)
Glucose-Capillary: 161 mg/dL — ABNORMAL HIGH (ref 65–99)
Glucose-Capillary: 115 mg/dL — ABNORMAL HIGH (ref 65–99)

## 2016-05-07 SURGERY — HEMIARTHROPLASTY, HIP, DIRECT ANTERIOR APPROACH, FOR FRACTURE
Anesthesia: General | Site: Hip | Laterality: Right

## 2016-05-07 SURGERY — HEMIARTHROPLASTY, HIP, DIRECT ANTERIOR APPROACH, FOR FRACTURE
Anesthesia: General | Laterality: Right

## 2016-05-07 MED ORDER — NEOSTIGMINE METHYLSULFATE 10 MG/10ML IV SOLN
INTRAVENOUS | Status: DC | PRN
  Administered 2016-05-07: 3 mg via INTRAVENOUS

## 2016-05-07 MED ORDER — LIDOCAINE HCL (CARDIAC) 20 MG/ML IV SOLN
INTRAVENOUS | Status: DC | PRN
  Administered 2016-05-07: 60 mg via INTRAVENOUS

## 2016-05-07 MED ORDER — HYDRALAZINE HCL 20 MG/ML IJ SOLN
INTRAMUSCULAR | Status: DC | PRN
  Administered 2016-05-07: 5 mg via INTRAVENOUS

## 2016-05-07 MED ORDER — PHENOL 1.4 % MT LIQD: 1.0000 | OROMUCOSAL | Status: DC | PRN

## 2016-05-07 MED ORDER — METOCLOPRAMIDE HCL 5 MG PO TABS: 5.0000 mg | ORAL_TABLET | Freq: Three times a day (TID) | ORAL | Status: DC | PRN

## 2016-05-07 MED ORDER — GLYCOPYRROLATE 0.2 MG/ML IJ SOLN
INTRAMUSCULAR | Status: DC | PRN
  Administered 2016-05-07: 0.4 mg via INTRAVENOUS

## 2016-05-07 MED ORDER — FENTANYL CITRATE (PF) 100 MCG/2ML IJ SOLN: 25.0000 ug | INTRAMUSCULAR | Status: DC | PRN

## 2016-05-07 MED ORDER — ONDANSETRON HCL 4 MG/2ML IJ SOLN
4.0000 mg | Freq: Four times a day (QID) | INTRAMUSCULAR | Status: DC | PRN
  Administered 2016-05-08: 4 mg via INTRAVENOUS
  Filled 2016-05-07: qty 2

## 2016-05-07 MED ORDER — VANCOMYCIN HCL IN DEXTROSE 1-5 GM/200ML-% IV SOLN
1000.0000 mg | Freq: Two times a day (BID) | INTRAVENOUS | Status: AC
  Administered 2016-05-08: 1000 mg via INTRAVENOUS
  Filled 2016-05-07: qty 200

## 2016-05-07 MED ORDER — KETOROLAC TROMETHAMINE 30 MG/ML IJ SOLN
INTRAMUSCULAR | Status: AC
Start: 2016-05-07 — End: 2016-05-07
  Filled 2016-05-07: qty 1

## 2016-05-07 MED ORDER — DOCUSATE SODIUM 100 MG PO CAPS
100.0000 mg | ORAL_CAPSULE | Freq: Two times a day (BID) | ORAL | Status: DC
  Administered 2016-05-07 – 2016-05-08 (×2): 100 mg via ORAL
  Filled 2016-05-07 (×2): qty 1

## 2016-05-07 MED ORDER — VANCOMYCIN HCL IN DEXTROSE 1-5 GM/200ML-% IV SOLN
INTRAVENOUS | Status: AC
  Filled 2016-05-07: qty 200

## 2016-05-07 MED ORDER — ROCURONIUM BROMIDE 100 MG/10ML IV SOLN
INTRAVENOUS | Status: DC | PRN
  Administered 2016-05-07: 50 mg via INTRAVENOUS

## 2016-05-07 MED ORDER — TRAMADOL HCL 50 MG PO TABS
50.0000 mg | ORAL_TABLET | Freq: Four times a day (QID) | ORAL | Status: DC | PRN
  Administered 2016-05-08 – 2016-05-09 (×3): 50 mg via ORAL
  Filled 2016-05-07 (×3): qty 1

## 2016-05-07 MED ORDER — SODIUM CHLORIDE 0.9 % IJ SOLN
INTRAMUSCULAR | Status: DC | PRN
  Administered 2016-05-07: 30 mL

## 2016-05-07 MED ORDER — SENNA 8.6 MG PO TABS
1.0000 | ORAL_TABLET | Freq: Two times a day (BID) | ORAL | Status: DC
  Administered 2016-05-07 – 2016-05-09 (×4): 8.6 mg via ORAL
  Filled 2016-05-07 (×4): qty 1

## 2016-05-07 MED ORDER — ACETAMINOPHEN 10 MG/ML IV SOLN
INTRAVENOUS | Status: AC
  Filled 2016-05-07: qty 100

## 2016-05-07 MED ORDER — VANCOMYCIN HCL IN DEXTROSE 1-5 GM/200ML-% IV SOLN
1000.0000 mg | INTRAVENOUS | Status: AC
  Administered 2016-05-07: 1000 mg via INTRAVENOUS

## 2016-05-07 MED ORDER — BUPIVACAINE-EPINEPHRINE (PF) 0.5% -1:200000 IJ SOLN
INTRAMUSCULAR | Status: AC
  Filled 2016-05-07: qty 30

## 2016-05-07 MED ORDER — FENTANYL CITRATE (PF) 250 MCG/5ML IJ SOLN
INTRAMUSCULAR | Status: AC
  Filled 2016-05-07: qty 5

## 2016-05-07 MED ORDER — LACTATED RINGERS IV SOLN
INTRAVENOUS | Status: DC
  Administered 2016-05-07 (×2): via INTRAVENOUS

## 2016-05-07 MED ORDER — ACETAMINOPHEN 650 MG RE SUPP: 650.0000 mg | Freq: Four times a day (QID) | RECTAL | Status: DC | PRN

## 2016-05-07 MED ORDER — LIDOCAINE 2% (20 MG/ML) 5 ML SYRINGE
INTRAMUSCULAR | Status: AC
  Filled 2016-05-07: qty 5

## 2016-05-07 MED ORDER — PROPOFOL 10 MG/ML IV BOLUS
INTRAVENOUS | Status: AC
  Filled 2016-05-07: qty 20

## 2016-05-07 MED ORDER — ONDANSETRON HCL 4 MG/2ML IJ SOLN
INTRAMUSCULAR | Status: AC
  Filled 2016-05-07: qty 2

## 2016-05-07 MED ORDER — METOCLOPRAMIDE HCL 5 MG/ML IJ SOLN: 5.0000 mg | Freq: Three times a day (TID) | INTRAMUSCULAR | Status: DC | PRN

## 2016-05-07 MED ORDER — ONDANSETRON HCL 4 MG/2ML IJ SOLN
INTRAMUSCULAR | Status: DC | PRN
  Administered 2016-05-07: 4 mg via INTRAVENOUS

## 2016-05-07 MED ORDER — CHLORHEXIDINE GLUCONATE 4 % EX LIQD: 60.0000 mL | Freq: Once | CUTANEOUS | Status: DC

## 2016-05-07 MED ORDER — PROPOFOL 10 MG/ML IV BOLUS
INTRAVENOUS | Status: DC | PRN
  Administered 2016-05-07: 80 mg via INTRAVENOUS

## 2016-05-07 MED ORDER — MENTHOL 3 MG MT LOZG
1.0000 | LOZENGE | OROMUCOSAL | Status: DC | PRN
  Administered 2016-05-08 (×2): 3 mg via ORAL
  Filled 2016-05-07: qty 9

## 2016-05-07 MED ORDER — FENTANYL CITRATE (PF) 100 MCG/2ML IJ SOLN
INTRAMUSCULAR | Status: DC | PRN
  Administered 2016-05-07 (×2): 50 ug via INTRAVENOUS
  Administered 2016-05-07: 100 ug via INTRAVENOUS
  Administered 2016-05-07: 50 ug via INTRAVENOUS

## 2016-05-07 MED ORDER — BUPIVACAINE-EPINEPHRINE (PF) 0.5% -1:200000 IJ SOLN
INTRAMUSCULAR | Status: DC | PRN
  Administered 2016-05-07: 30 mL

## 2016-05-07 MED ORDER — KETOROLAC TROMETHAMINE 30 MG/ML IJ SOLN
INTRAMUSCULAR | Status: DC | PRN
  Administered 2016-05-07: 30 mg via INTRAMUSCULAR

## 2016-05-07 MED ORDER — ENOXAPARIN SODIUM 40 MG/0.4ML ~~LOC~~ SOLN
40.0000 mg | SUBCUTANEOUS | Status: DC
  Administered 2016-05-08 – 2016-05-09 (×2): 40 mg via SUBCUTANEOUS
  Filled 2016-05-07 (×2): qty 0.4

## 2016-05-07 MED ORDER — METOPROLOL TARTRATE 5 MG/5ML IV SOLN
INTRAVENOUS | Status: DC | PRN
Start: 2016-05-07 — End: 2016-05-07
  Administered 2016-05-07: 1 mg via INTRAVENOUS
  Administered 2016-05-07: 3 mg via INTRAVENOUS

## 2016-05-07 MED ORDER — SODIUM CHLORIDE 0.9 % IR SOLN
Status: DC | PRN
  Administered 2016-05-07 (×2): 1000 mL

## 2016-05-07 MED ORDER — ONDANSETRON HCL 4 MG PO TABS: 4.0000 mg | ORAL_TABLET | Freq: Four times a day (QID) | ORAL | Status: DC | PRN

## 2016-05-07 MED ORDER — ACETAMINOPHEN 325 MG PO TABS
650.0000 mg | ORAL_TABLET | Freq: Four times a day (QID) | ORAL | Status: DC | PRN
  Administered 2016-05-08: 650 mg via ORAL
  Filled 2016-05-07: qty 2

## 2016-05-07 SURGICAL SUPPLY — 59 items
ADH SKN CLS APL DERMABOND .7 (GAUZE/BANDAGES/DRESSINGS) ×1
BLADE SAW SGTL 18X1.27X75 (BLADE) ×2 IMPLANT
BLADE SAW SGTL 18X1.27X75MM (BLADE) ×1
BLADE SURG ROTATE 9660 (MISCELLANEOUS) IMPLANT
CAPT HIP HEMI 2 ×2 IMPLANT
CHLORAPREP W/TINT 26ML (MISCELLANEOUS) ×3 IMPLANT
COVER SURGICAL LIGHT HANDLE (MISCELLANEOUS) ×3 IMPLANT
DERMABOND ADVANCED (GAUZE/BANDAGES/DRESSINGS) ×2
DERMABOND ADVANCED .7 DNX12 (GAUZE/BANDAGES/DRESSINGS) ×2 IMPLANT
DRAPE C-ARM 42X72 X-RAY (DRAPES) ×3 IMPLANT
DRAPE IMP U-DRAPE 54X76 (DRAPES) ×6 IMPLANT
DRAPE PROXIMA HALF (DRAPES) ×2 IMPLANT
DRAPE STERI IOBAN 125X83 (DRAPES) ×3 IMPLANT
DRAPE U-SHAPE 47X51 STRL (DRAPES) ×9 IMPLANT
DRSG AQUACEL AG ADV 3.5X10 (GAUZE/BANDAGES/DRESSINGS) ×3 IMPLANT
ELECT BLADE 4.0 EZ CLEAN MEGAD (MISCELLANEOUS) ×3
ELECT REM PT RETURN 9FT ADLT (ELECTROSURGICAL) ×3
ELECTRODE BLDE 4.0 EZ CLN MEGD (MISCELLANEOUS) ×1 IMPLANT
ELECTRODE REM PT RTRN 9FT ADLT (ELECTROSURGICAL) ×1 IMPLANT
EVACUATOR 1/8 PVC DRAIN (DRAIN) IMPLANT
GLOVE BIO SURGEON STRL SZ 6.5 (GLOVE) ×1 IMPLANT
GLOVE BIO SURGEON STRL SZ8.5 (GLOVE) ×8 IMPLANT
GLOVE BIO SURGEONS STRL SZ 6.5 (GLOVE) ×1
GLOVE BIOGEL PI IND STRL 8.5 (GLOVE) ×1 IMPLANT
GLOVE BIOGEL PI INDICATOR 8.5 (GLOVE) ×2
GLOVE SURG SS PI 6.5 STRL IVOR (GLOVE) ×2 IMPLANT
GLOVE SURG SS PI 8.0 STRL IVOR (GLOVE) ×6 IMPLANT
GOWN STRL REUS W/ TWL LRG LVL3 (GOWN DISPOSABLE) ×2 IMPLANT
GOWN STRL REUS W/TWL 2XL LVL3 (GOWN DISPOSABLE) ×5 IMPLANT
GOWN STRL REUS W/TWL LRG LVL3 (GOWN DISPOSABLE)
HANDPIECE INTERPULSE COAX TIP (DISPOSABLE) ×3
HOOD PEEL AWAY FACE SHEILD DIS (HOOD) ×6 IMPLANT
KIT BASIN OR (CUSTOM PROCEDURE TRAY) ×3 IMPLANT
KIT ROOM TURNOVER OR (KITS) ×3 IMPLANT
MANIFOLD NEPTUNE II (INSTRUMENTS) ×3 IMPLANT
MARKER SKIN DUAL TIP RULER LAB (MISCELLANEOUS) ×3 IMPLANT
NDL SPNL 18GX3.5 QUINCKE PK (NEEDLE) ×1 IMPLANT
NEEDLE SPNL 18GX3.5 QUINCKE PK (NEEDLE) ×3 IMPLANT
NS IRRIG 1000ML POUR BTL (IV SOLUTION) ×1 IMPLANT
PACK TOTAL JOINT (CUSTOM PROCEDURE TRAY) ×3 IMPLANT
PACK UNIVERSAL I (CUSTOM PROCEDURE TRAY) ×3 IMPLANT
PAD ARMBOARD 7.5X6 YLW CONV (MISCELLANEOUS) ×6 IMPLANT
SEALER BIPOLAR AQUA 6.0 (INSTRUMENTS) ×2 IMPLANT
SET HNDPC FAN SPRY TIP SCT (DISPOSABLE) ×1 IMPLANT
SUCTION FRAZIER HANDLE 10FR (MISCELLANEOUS) ×2
SUCTION TUBE FRAZIER 10FR DISP (MISCELLANEOUS) ×1 IMPLANT
SUT ETHIBOND NAB CT1 #1 30IN (SUTURE) ×6 IMPLANT
SUT MNCRL AB 3-0 PS2 18 (SUTURE) ×3 IMPLANT
SUT MON AB 2-0 CT1 36 (SUTURE) ×5 IMPLANT
SUT VIC AB 1 CT1 27 (SUTURE) ×3
SUT VIC AB 1 CT1 27XBRD ANBCTR (SUTURE) ×1 IMPLANT
SUT VIC AB 2-0 CT1 27 (SUTURE) ×3
SUT VIC AB 2-0 CT1 TAPERPNT 27 (SUTURE) ×1 IMPLANT
SUT VLOC 180 0 24IN GS25 (SUTURE) ×5 IMPLANT
SYR 50ML LL SCALE MARK (SYRINGE) ×3 IMPLANT
TOWEL OR 17X24 6PK STRL BLUE (TOWEL DISPOSABLE) ×3 IMPLANT
TOWEL OR 17X26 10 PK STRL BLUE (TOWEL DISPOSABLE) ×3 IMPLANT
TRAY FOLEY CATH 16FR SILVER (SET/KITS/TRAYS/PACK) IMPLANT
WATER STERILE IRR 1000ML POUR (IV SOLUTION) ×3 IMPLANT

## 2016-05-07 NOTE — Anesthesia Procedure Notes (Signed)
Procedure Name: Intubation Date/Time: 05/07/2016 5:09 PM Performed by: Faustino CongressWHITE, Ramon Brant TENA Anesia Blackwell Pre-anesthesia Checklist: Patient identified, Emergency Drugs available, Suction available and Patient being monitored Patient Re-evaluated:Patient Re-evaluated prior to inductionOxygen Delivery Method: Circle System Utilized Preoxygenation: Pre-oxygenation with 100% oxygen Intubation Type: IV induction Ventilation: Mask ventilation without difficulty and Oral airway inserted - appropriate to patient size Laryngoscope Size: Mac and 3 Grade View: Grade III Tube type: Oral Tube size: 7.0 mm Number of attempts: 1 Airway Equipment and Method: Stylet and Oral airway Placement Confirmation: ETT inserted through vocal cords under direct vision,  positive ETCO2 and breath sounds checked- equal and bilateral Secured at: 22 cm Tube secured with: Tape Dental Injury: Teeth and Oropharynx as per pre-operative assessment

## 2016-05-07 NOTE — Interval H&P Note (Signed)
History and Physical Interval Note:  05/07/2016 4:03 PM  [name]  has presented today for surgery, with the diagnosis of right hip fracture  The various methods of treatment have been discussed with the patient and family. After consideration of risks, benefits and other options for treatment, the patient has consented to  Procedure(s): ANTERIOR APPROACH HEMI HIP ARTHROPLASTY VERSUS TOTAL HIP ARTHROPLASTY (Right) as a surgical intervention .  The patient's history has been reviewed, patient examined, no change in status, stable for surgery.  I have reviewed the patient's chart and labs.  Questions were answered to the patient's satisfaction.     Dastan Krider, Cloyde ReamsBrian James

## 2016-05-07 NOTE — Discharge Instructions (Signed)
°Dr. Janetta Vandoren °Joint Replacement Specialist °[facility] °[address] °[place], [place] [address] °[phone] ° ° °TOTAL HIP REPLACEMENT POSTOPERATIVE DIRECTIONS ° ° ° °Hip Rehabilitation, Guidelines Following Surgery  ° °WEIGHT BEARING °Weight bearing as tolerated with assist device (walker, cane, etc) as directed, use it as long as suggested by your surgeon or therapist, typically at least 4-6 weeks. ° °The results of a hip operation are greatly improved after range of motion and muscle strengthening exercises. Follow all safety measures which are given to protect your hip. If any of these exercises cause increased pain or swelling in your joint, decrease the amount until you are comfortable again. Then slowly increase the exercises. Call your caregiver if you have problems or questions.  ° °HOME CARE INSTRUCTIONS  °Most of the following instructions are designed to prevent the dislocation of your new hip.  °Remove items at home which could result in a fall. This includes throw rugs or furniture in walking pathways.  °Continue medications as instructed at time of discharge. °· You may have some home medications which will be placed on hold until you complete the course of blood thinner medication. °· You may start showering once you are discharged home. Do not remove your dressing. °Do not put on socks or shoes without following the instructions of your caregivers.   °Sit on chairs with arms. Use the chair arms to help push yourself up when arising.  °Arrange for the use of a toilet seat elevator so you are not sitting low.  °· Walk with walker as instructed.  °You may resume a sexual relationship in one month or when given the OK by your caregiver.  °Use walker as long as suggested by your caregivers.  °You may put full weight on your legs and walk as much as is comfortable. °Avoid periods of inactivity such as sitting longer than an hour when not asleep. This helps prevent  blood clots.  °You may return to work once you are cleared by your surgeon.  °Do not drive a car for 6 weeks or until released by your surgeon.  °Do not drive while taking narcotics.  °Wear elastic stockings for two weeks following surgery during the day but you may remove then at night.  °Make sure you keep all of your appointments after your operation with all of your doctors and caregivers. You should call the office at the above phone number and make an appointment for approximately two weeks after the date of your surgery. °Please pick up a stool softener and laxative for home use as long as you are requiring pain medications. °· ICE to the affected hip every three hours for 30 minutes at a time and then as needed for pain and swelling. Continue to use ice on the hip for pain and swelling from surgery. You may notice swelling that will progress down to the foot and ankle.  This is normal after surgery.  Elevate the leg when you are not up walking on it.   °It is important for you to complete the blood thinner medication as prescribed by your doctor. °· Continue to use the breathing machine which will help keep your temperature down.  It is common for your temperature to cycle up and down following surgery, especially at night when you are not up moving around and exerting yourself.  The breathing machine keeps your lungs expanded and your temperature down. ° °RANGE OF MOTION AND STRENGTHENING EXERCISES  °These exercises are   designed to help you keep full movement of your hip joint. Follow your caregiver's or physical therapist's instructions. Perform all exercises about fifteen times, three times per day or as directed. Exercise both hips, even if you have had only one joint replacement. These exercises can be done on a training (exercise) mat, on the floor, on a table or on a bed. Use whatever works the best and is most comfortable for you. Use music or television while you are exercising so that the exercises  are a pleasant break in your day. This will make your life better with the exercises acting as a break in routine you can look forward to.  °Lying on your back, slowly slide your foot toward your buttocks, raising your knee up off the floor. Then slowly slide your foot back down until your leg is straight again.  °Lying on your back spread your legs as far apart as you can without causing discomfort.  °Lying on your side, raise your upper leg and foot straight up from the floor as far as is comfortable. Slowly lower the leg and repeat.  °Lying on your back, tighten up the muscle in the front of your thigh (quadriceps muscles). You can do this by keeping your leg straight and trying to raise your heel off the floor. This helps strengthen the largest muscle supporting your knee.  °Lying on your back, tighten up the muscles of your buttocks both with the legs straight and with the knee bent at a comfortable angle while keeping your heel on the floor.  ° °SKILLED REHAB INSTRUCTIONS: °If the patient is transferred to a skilled rehab facility following release from the hospital, a list of the current medications will be sent to the facility for the patient to continue.  When discharged from the skilled rehab facility, please have the facility set up the patient's Home Health Physical Therapy prior to being released. Also, the skilled facility will be responsible for providing the patient with their medications at time of release from the facility to include their pain medication and their blood thinner medication. If the patient is still at the rehab facility at time of the two week follow up appointment, the skilled rehab facility will also need to assist the patient in arranging follow up appointment in our office and any transportation needs. ° °MAKE SURE YOU:  °Understand these instructions.  °Will watch your condition.  °Will get help right away if you are not doing well or get worse. ° °Pick up stool softner and  laxative for home use following surgery while on pain medications. °Do not remove your dressing. °The dressing is waterproof--it is OK to take showers. °Continue to use ice for pain and swelling after surgery. °Do not use any lotions or creams on the incision until instructed by your surgeon. °Total Hip Protocol. ° ° °

## 2016-05-07 NOTE — Progress Notes (Signed)
PROGRESS NOTE        PATIENT DETAILS Name: [name] Age: 81 y.o. Sex: female Date of Birth: 05/20/1935 Admit Date: 05/04/2016 Admitting Physician Eddie NorthNishant Dhungel, MD [id number], Myrene GalasIMRAN P, MD Outpatient Specialists: None  Brief Narrative: Patient is a 81 y.o. female of dementia, hypertension, type 2 diabetes who presented with right hip pain, further workup revealed right hip fracture.  Subjective: Complains for pain in her right hip area, no abdominal pain today.   Assessment/Plan: Principal Problem: Closed right hip fracture: Likely secondary to a mechanical fall that the patient sustained a few days prior to this admission at skilled nursing facility. Seen by orthopedics, plans are for hip repair on 5/8.  Active Problems: Abd pain:? Gastritis. Seems to have resolved, XRay Abd on 5/7 with no acute abnormalities. Continue PPI. Abd is soft and non tender today  Hypertension: Controlled, continue metoprolol, amlodipine and lisinopril.  Type 2 diabetes: CBGs stable with SSI, continue to hold oral hypoglycemic agents.  Hypothyroidism: Continue Synthroid  Dyslipidemia: Continue statin  Dementia with mild delirium: Mostly pleasantly confused, continue Aricept.  DVT Prophylaxis: SCD's  Code Status:  DNR  Family Communication: None at bedside  Disposition Plan: Remain inpatient- SNF on discharge  Antimicrobial agents: None  Procedures: None  CONSULTS:  orthopedic surgery  Time spent: 25 minutes-Greater than 50% of this time was spent in counseling, explanation of diagnosis, planning of further management, and coordination of care.  MEDICATIONS: Anti-infectives    None      Scheduled Meds: . acetaminophen  1,000 mg Oral Q8H  . amLODipine  5 mg Oral Daily  . cholecalciferol  1,000 Units Oral Daily  . citalopram  20 mg Oral Daily  . donepezil  10 mg Oral QHS  . feeding supplement (ENSURE ENLIVE)  237 mL Oral BID BM  . insulin  aspart  0-9 Units Subcutaneous TID WC  . levothyroxine  25 mcg Oral QAC breakfast  . lisinopril  40 mg Oral Daily  . memantine  28 mg Oral Daily  . metoprolol tartrate  25 mg Oral BID  . pantoprazole  40 mg Oral Q1200  . polyethylene glycol  17 g Oral Daily  . pravastatin  40 mg Oral Daily  . topiramate  50 mg Oral BID  . tranexamic acid  (ORTHO-IV)  1,000 mg Intravenous To OR   Continuous Infusions:  PRN Meds:.morphine injection, oxyCODONE, senna-docusate   PHYSICAL EXAM: Vital signs: Filed Vitals:   05/06/16 0424 05/06/16 1225 05/06/16 2001 05/07/16 0529  BP: 123/67 134/64 140/46 143/56  Pulse: 59 63 65 65  Temp: 97.5 F (36.4 C) 99 F (37.2 C) 98.9 F (37.2 C) 98.1 F (36.7 C)  TempSrc: Oral Axillary Axillary Oral  Resp: 16 18 18 18   Height:      Weight:      SpO2: 98% 97% 98% 97%   Filed Weights   05/04/16 1330 05/04/16 2112  Weight: 65.772 kg (145 lb) 60.8 kg (134 lb 0.6 oz)   Body mass index is 26.18 kg/(m^2).   Gen Exam: Awake,somewhat confused-mild in mild distress due to pain . She looks chronically ill. Neck: Supple, No JVD.   Chest: B/L Clear.   CVS: S1 S2 Regular Abdomen: soft, BS +,non tender this am. Extremities: no edema, lower extremities warm to touch. Neurologic: Non Focal.   Skin: No Rash or lesions  Wounds: N/A.    LABORATORY DATA: CBC:  Recent Labs Lab 05/04/16 1420 05/04/16 2248 05/05/16 0628  WBC 7.3 7.4 5.9  NEUTROABS 5.0  --  3.0  HGB 12.2 12.0 11.6*  HCT 34.9* 35.3* 34.2*  MCV 87.0 86.1 87.5  PLT 170 193 178    Basic Metabolic Panel:  Recent Labs Lab 05/04/16 1420 05/04/16 2248 05/05/16 0628  NA 140  --  139  K 3.4*  --  3.5  CL 109  --  105  CO2 23  --  21*  GLUCOSE 231*  --  157*  BUN 13  --  13  CREATININE 0.70 0.72 0.78  CALCIUM 9.3  --  9.2    GFR: Estimated Creatinine Clearance: 47.2 mL/min (by C-G formula based on Cr of 0.78).  Liver Function Tests:  Recent Labs Lab 05/04/16 1420 05/05/16 0628    AST 17 69*  ALT 17 49  ALKPHOS 69 64  BILITOT 0.6 0.9  PROT 6.4* 5.9*  ALBUMIN 3.3* 2.9*   No results for input(s): LIPASE, AMYLASE in the last 168 hours. No results for input(s): AMMONIA in the last 168 hours.  Coagulation Profile:  Recent Labs Lab 05/04/16 1420  INR 1.10    Cardiac Enzymes: No results for input(s): CKTOTAL, CKMB, CKMBINDEX, TROPONINI in the last 168 hours.  BNP (last 3 results) No results for input(s): PROBNP in the last 8760 hours.  HbA1C: No results for input(s): HGBA1C in the last 72 hours.  CBG:  Recent Labs Lab 05/06/16 0610 05/06/16 1101 05/06/16 1614 05/06/16 2115 05/07/16 0604  GLUCAP 221* 226* 185* 142* 161*    Lipid Profile: No results for input(s): CHOL, HDL, LDLCALC, TRIG, CHOLHDL, LDLDIRECT in the last 72 hours.  Thyroid Function Tests: No results for input(s): TSH, T4TOTAL, FREET4, T3FREE, THYROIDAB in the last 72 hours.  Anemia Panel: No results for input(s): VITAMINB12, FOLATE, FERRITIN, TIBC, IRON, RETICCTPCT in the last 72 hours.  Urine analysis:    Component Value Date/Time   COLORURINE YELLOW 05/04/2016 1517   APPEARANCEUR CLEAR 05/04/2016 1517   LABSPEC 1.010 05/04/2016 1517   PHURINE 6.5 05/04/2016 1517   GLUCOSEU NEGATIVE 05/04/2016 1517   HGBUR NEGATIVE 05/04/2016 1517   HGBUR negative 12/19/2010 1223   BILIRUBINUR NEGATIVE 05/04/2016 1517   KETONESUR NEGATIVE 05/04/2016 1517   PROTEINUR NEGATIVE 05/04/2016 1517   UROBILINOGEN 0.2 06/14/2015 2048   NITRITE NEGATIVE 05/04/2016 1517   LEUKOCYTESUR NEGATIVE 05/04/2016 1517    Sepsis Labs: Lactic Acid, Venous    Component Value Date/Time   LATICACIDVEN 1.49 04/24/2016 2044    MICROBIOLOGY: Recent Results (from the past 240 hour(s))  Urine culture     Status: None   Collection Time: 05/04/16  3:17 PM  Result Value Ref Range Status   Specimen Description URINE, CATHETERIZED  Final   Special Requests NONE  Final   Culture   Final    NO GROWTH 2  DAYS Performed at [facility]    Report Status 05/06/2016 FINAL  Final  Surgical PCR screen     Status: None   Collection Time: 05/05/16  6:17 AM  Result Value Ref Range Status   MRSA, PCR NEGATIVE NEGATIVE Final   Staphylococcus aureus NEGATIVE NEGATIVE Final    Comment:        The Xpert SA Assay (FDA approved for NASAL specimens in patients over 47 years of age), is one component of a comprehensive surveillance program.  Test performance has been validated by [facility]  for patients greater than or equal to 28 year old. It is not intended to diagnose infection nor to guide or monitor treatment.     RADIOLOGY STUDIES/RESULTS: Dg Chest 1 View  05/04/2016  CLINICAL DATA:  Pain following fall 3 days prior EXAM: CHEST 1 VIEW COMPARISON:  April 24, 2016 FINDINGS: There is no edema or consolidation. Heart is upper normal in size with pulmonary vascularity within normal limits. No adenopathy. No pneumothorax. No bone lesions. IMPRESSION: No edema or consolidation. Electronically Signed   By: Bretta Bang III M.D.   On: 05/04/2016 14:56   Dg Chest 2 View  04/24/2016  CLINICAL DATA:  Acute onset of altered mental status. Initial encounter. EXAM: CHEST  2 VIEW COMPARISON:  Chest radiograph from 12/07/2015 FINDINGS: The lungs are well-aerated. There is mild elevation of the right hemidiaphragm. Mild peribronchial thickening is noted. There is no evidence of focal opacification, pleural effusion or pneumothorax. The heart is borderline normal in size. No acute osseous abnormalities are seen. IMPRESSION: Mild elevation of the right hemidiaphragm. Mild peribronchial thickening noted. Electronically Signed   By: Roanna Raider M.D.   On: 04/24/2016 20:08   Ct Head Wo Contrast  04/24/2016  CLINICAL DATA:  Altered mental status EXAM: CT HEAD WITHOUT CONTRAST TECHNIQUE: Contiguous axial images were obtained from the base of the skull through the vertex without intravenous contrast.  COMPARISON:  12/07/2015 FINDINGS: Bony calvarium is intact. Diffuse atrophic changes are noted. No findings to suggest acute hemorrhage, acute infarction or space-occupying mass lesion are noted. IMPRESSION: Atrophic changes without acute abnormality. Electronically Signed   By: Alcide Clever M.D.   On: 04/24/2016 20:06   Dg Abd Portable 1v  05/06/2016  CLINICAL DATA:  Patient with history of right hip fracture. Mid and lower abdominal pain. EXAM: PORTABLE ABDOMEN - 1 VIEW COMPARISON:  Right lower extremity radiograph 05/04/2016 FINDINGS: Gas is demonstrated within nondilated loops of large and small bowel in a nonobstructed pattern. Lumbar spine degenerative changes. Lumbar spinal fusion hardware. Known right hip fracture not well demonstrated on current exam. Pelvic phleboliths. IMPRESSION: Nonobstructed bowel gas pattern. Electronically Signed   By: Annia Belt M.D.   On: 05/06/2016 12:11   Dg Hip Unilat With Pelvis 1v Right  05/04/2016  CLINICAL DATA:  Pain following fall EXAM: DG HIP (WITH OR WITHOUT PELVIS) 1V RIGHT COMPARISON:  None. FINDINGS: Frontal pelvis as well as frontal and lateral right hip images were obtained. There is evidence suggesting impaction along the lateral subcapital region of the right femoral neck. No other evidence of fracture. No dislocation. Joint spaces appear normal. Bones are diffusely osteoporotic. IMPRESSION: Evidence of an impaction type injury along the subcapital femoral neck on the right. No other evidence of fracture. No dislocation. No appreciable arthropathic change. Bones diffusely osteoporotic. Electronically Signed   By: Bretta Bang III M.D.   On: 05/04/2016 14:57     LOS: 3 days   Jeoffrey Massed, MD  [facility] Pager:[phone]  If 7PM-7AM, please contact night-coverage [email or web address] Password [facility] 05/07/2016, 8:57 AM

## 2016-05-07 NOTE — Transfer of Care (Signed)
Immediate Anesthesia Transfer of Care Note  Patient: [name]  Procedure(s) Performed: Procedure(s): ANTERIOR APPROACH HEMI HIP ARTHROPLASTY VERSUS TOTAL HIP ARTHROPLASTY (Right)  Patient Location: PACU  Anesthesia Type:General  Level of Consciousness: awake, alert  and patient cooperative  Airway & Oxygen Therapy: Patient Spontanous Breathing  Post-op Assessment: Report given to RN and Post -op Vital signs reviewed and stable  Post vital signs: Reviewed and stable  Last Vitals:  Filed Vitals:   05/07/16 1300 05/07/16 1917  BP: 136/61 149/79  Pulse: 59   Temp: 36.9 C 36.9 C  Resp: 18 11    Last Pain:  Filed Vitals:   05/07/16 1919  PainSc: 0-No pain         Complications: No apparent anesthesia complications

## 2016-05-07 NOTE — Anesthesia Preprocedure Evaluation (Signed)
Anesthesia Evaluation  Patient identified by MRN, date of birth, ID band Patient awake    Reviewed: Allergy & Precautions, NPO status , Patient's Chart, lab work & pertinent test results, reviewed documented beta blocker date and time   Airway Mallampati: III  TM Distance: >3 FB Neck ROM: Full    Dental  (+) Poor Dentition, Missing, Chipped, Dental Advisory Given   Pulmonary neg pulmonary ROS, former smoker,    Pulmonary exam normal breath sounds clear to auscultation       Cardiovascular hypertension, Pt. on medications and Pt. on home beta blockers (-) anginaNormal cardiovascular exam Rhythm:Regular Rate:Normal  '11 ECHO: EF 60-65%, valves OK   Neuro/Psych  Headaches, PSYCHIATRIC DISORDERS (Alzheimer's) Anxiety Alzheimer's Disease negative psych ROS   GI/Hepatic negative GI ROS, Neg liver ROS,   Endo/Other  negative endocrine ROSdiabetes, Poorly Controlled, Type 2, Oral Hypoglycemic AgentsHypothyroidism   Renal/GU   negative genitourinary   Musculoskeletal negative musculoskeletal ROS (+)   Abdominal   Peds  Hematology  (+) Blood dyscrasia (Hb 11.5), anemia ,   Anesthesia Other Findings   Reproductive/Obstetrics negative OB ROS                             Anesthesia Physical  Anesthesia Plan  ASA: III  Anesthesia Plan: General   Post-op Pain Management:    Induction: Intravenous  Airway Management Planned: Oral ETT  Additional Equipment:   Intra-op Plan:   Post-operative Plan: Extubation in OR  Informed Consent: I have reviewed the patients History and Physical, chart, labs and discussed the procedure including the risks, benefits and alternatives for the proposed anesthesia with the patient or authorized representative who has indicated his/her understanding and acceptance.   Dental advisory given, Consent reviewed with POA and History available from chart only  Plan  Discussed with: Surgeon, CRNA and Anesthesiologist  Anesthesia Plan Comments: (Plan routine monitors, GETA Intra-operative suspension of DNR, possible transfusion Discussed with daughter, POA, Laure KidneyDebra Weaver by telephone )        Anesthesia Quick Evaluation

## 2016-05-07 NOTE — H&P (View-Only) (Signed)
Reason for Consult:Broken Right Hip Referring Physician: EDP  [name] is an 81 y.o. female.  HPI: 81 yo female with dementia who fell three days ago at the nursing home where she is a resident.  Patient complaining of persistent pain in the right leg and unable to bear full weight on it.  Patient presented to [facility] for further evaluation.  Due to lack of ortho coverage on call, patient transferred to [facility] for definitive care. Of note the patient has had a recent positive blood culture in the last week for Gram pos cocci.  The patient is a poor historian  Past Medical History  Diagnosis Date  . Diabetes mellitus   . Hypertension   . Dementia   . Anxiety   . Hypercholesteremia   . Thyroid disease   . Alzheimer disease     per facility paperwork dated 05/04/2016    Past Surgical History  Procedure Laterality Date  . Abdominal hysterectomy    . Cholecystectomy      Family History  Problem Relation Age of Onset  . Hypertension Other     Social History:  reports that she has quit smoking. She does not have any smokeless tobacco history on file. She reports that she does not drink alcohol or use illicit drugs.  Allergies:  Allergies  Allergen Reactions  . Penicillins Nausea And Vomiting  . Promethazine Hcl Nausea And Vomiting  . Sulfonamide Derivatives Other (See Comments)    unknown    Medications: I have reviewed the patient's current medications.  Results for orders placed or performed during the hospital encounter of 05/04/16 (from the past 48 hour(s))  APTT     Status: None   Collection Time: 05/04/16  2:20 PM  Result Value Ref Range   aPTT 29 24 - 37 seconds  Comprehensive metabolic panel     Status: Abnormal   Collection Time: 05/04/16  2:20 PM  Result Value Ref Range   Sodium 140 135 - 145 mmol/L   Potassium 3.4 (L) 3.5 - 5.1 mmol/L   Chloride 109 101 - 111 mmol/L   CO2 23 22 - 32 mmol/L   Glucose, Bld 231 (H) 65 - 99 mg/dL   BUN 13 6 - 20 mg/dL   Creatinine, Ser 0.70 0.44 - 1.00 mg/dL   Calcium 9.3 8.9 - 10.3 mg/dL   Total Protein 6.4 (L) 6.5 - 8.1 g/dL   Albumin 3.3 (L) 3.5 - 5.0 g/dL   AST 17 15 - 41 U/L   ALT 17 14 - 54 U/L   Alkaline Phosphatase 69 38 - 126 U/L   Total Bilirubin 0.6 0.3 - 1.2 mg/dL   GFR calc non Af Amer >60 >60 mL/min   GFR calc Af Amer >60 >60 mL/min    Comment: (NOTE) The eGFR has been calculated using the CKD EPI equation. This calculation has not been validated in all clinical situations. eGFR's persistently <60 mL/min signify possible Chronic Kidney Disease.    Anion gap 8 5 - 15  CBC WITH DIFFERENTIAL     Status: Abnormal   Collection Time: 05/04/16  2:20 PM  Result Value Ref Range   WBC 7.3 4.0 - 10.5 K/uL   RBC 4.01 3.87 - 5.11 MIL/uL   Hemoglobin 12.2 12.0 - 15.0 g/dL   HCT 34.9 (L) 36.0 - 46.0 %   MCV 87.0 78.0 - 100.0 fL   MCH 30.4 26.0 - 34.0 pg   MCHC 35.0 30.0 - 36.0 g/dL  RDW 12.6 11.5 - 15.5 %   Platelets 170 150 - 400 K/uL   Neutrophils Relative % 67 %   Neutro Abs 5.0 1.7 - 7.7 K/uL   Lymphocytes Relative 22 %   Lymphs Abs 1.6 0.7 - 4.0 K/uL   Monocytes Relative 7 %   Monocytes Absolute 0.5 0.1 - 1.0 K/uL   Eosinophils Relative 3 %   Eosinophils Absolute 0.2 0.0 - 0.7 K/uL   Basophils Relative 1 %   Basophils Absolute 0.1 0.0 - 0.1 K/uL  Protime-INR     Status: None   Collection Time: 05/04/16  2:20 PM  Result Value Ref Range   Prothrombin Time 14.4 11.6 - 15.2 seconds   INR 1.10 0.00 - 1.49  Type and screen [facility]     Status: None   Collection Time: 05/04/16  2:20 PM  Result Value Ref Range   ABO/RH(D) O POS    Antibody Screen NEG    Sample Expiration 05/07/2016   Urinalysis, Routine w reflex microscopic (not at [facility])     Status: None   Collection Time: 05/04/16  3:17 PM  Result Value Ref Range   Color, Urine YELLOW YELLOW   APPearance CLEAR CLEAR   Specific Gravity, Urine 1.010 1.005 - 1.030   pH 6.5 5.0 - 8.0   Glucose, UA NEGATIVE NEGATIVE mg/dL    Hgb urine dipstick NEGATIVE NEGATIVE   Bilirubin Urine NEGATIVE NEGATIVE   Ketones, ur NEGATIVE NEGATIVE mg/dL   Protein, ur NEGATIVE NEGATIVE mg/dL   Nitrite NEGATIVE NEGATIVE   Leukocytes, UA NEGATIVE NEGATIVE    Comment: MICROSCOPIC NOT DONE ON URINES WITH NEGATIVE PROTEIN, BLOOD, LEUKOCYTES, NITRITE, OR GLUCOSE <1000 mg/dL.    Dg Chest 1 View  05/04/2016  CLINICAL DATA:  Pain following fall 3 days prior EXAM: CHEST 1 VIEW COMPARISON:  April 24, 2016 FINDINGS: There is no edema or consolidation. Heart is upper normal in size with pulmonary vascularity within normal limits. No adenopathy. No pneumothorax. No bone lesions. IMPRESSION: No edema or consolidation. Electronically Signed   By: Lowella Grip III M.D.   On: 05/04/2016 14:56   Dg Hip Unilat With Pelvis 1v Right  05/04/2016  CLINICAL DATA:  Pain following fall EXAM: DG HIP (WITH OR WITHOUT PELVIS) 1V RIGHT COMPARISON:  None. FINDINGS: Frontal pelvis as well as frontal and lateral right hip images were obtained. There is evidence suggesting impaction along the lateral subcapital region of the right femoral neck. No other evidence of fracture. No dislocation. Joint spaces appear normal. Bones are diffusely osteoporotic. IMPRESSION: Evidence of an impaction type injury along the subcapital femoral neck on the right. No other evidence of fracture. No dislocation. No appreciable arthropathic change. Bones diffusely osteoporotic. Electronically Signed   By: Lowella Grip III M.D.   On: 05/04/2016 14:57    ROS Blood pressure 157/72, pulse 69, temperature 98.3 F (36.8 C), temperature source Oral, resp. rate 9, height _0  (1.575 m), weight 65.772 kg (145 lb), SpO2 98 %. Physical Exam Patient is alert and oriented to person.  Neck non tender with normal AROM.  Chest non tender.  Bilateral UEs to include clavicles non tender no deformity and normal AROM and strength. Sensation intact bilateral UE and LE,  Right LE painful logroll. Well  perfused distally and able to wiggle the toes on the right foot.  Left Leg no pain with AROM of the hip knee and ankle  Assessment/Plan: Right valgus impacted femoral neck fracture.  Patient admitted  to medicine for thorough medical check and clearance.  ? Unsure of the source for positive blood culture. I have spoken with Dr Rod Can who plans surgery for the hip on Sunday. NPO after MN Saturday night  Shawnna Pancake,STEVEN R 05/04/2016, 9:01 PM

## 2016-05-07 NOTE — Op Note (Signed)
OPERATIVE REPORT  SURGEON: Samson Frederic, MD   ASSISTANT: Hart Carwin, RNFA.  PREOPERATIVE DIAGNOSIS: Displaced Right femoral neck fracture.   POSTOPERATIVE DIAGNOSIS: Displaced Right femoral neck fracture.   PROCEDURE: Right hip hemiarthroplasty, anterior approach.   IMPLANTS: DePuy Tri Lock stem, size 5, std offset, with a -3 mm spacer and a 43 mm monopolar head ball.  ANESTHESIA:  General  ANTIBIOTICS: 900 mg clindamycin. 1 g vancomycin.  ESTIMATED BLOOD LOSS: 250 mL.  DRAINS: None.  COMPLICATIONS: None   CONDITION: PACU - hemodynamically stable.   BRIEF CLINICAL NOTE: [name] is a 81 y.o. female with a displaced Right femoral neck fracture. The patient was admitted to the hospitalist service and underwent perioperative risk stratification and medical optimization. The risks, benefits, and alternatives to hemiarthroplasty were explained, and the patient elected to proceed.  PROCEDURE IN DETAIL: The patient was taken to the operating room and general anesthesia was induced on the hospital bed. The patient was then positioned on the Hana table. All bony prominences were well padded. The hip was prepped and draped in the normal sterile surgical fashion. A time-out was called verifying side and site of surgery. Antibiotics were given within 60 minutes of beginning the procedure.  The direct anterior approach to the hip was performed through the Hueter interval. Lateral femoral circumflex vessels were treated with the Auqumantys. The anterior capsule was exposed and an inverted T capsulotomy was made. Fracture hematoma was encountered and evacuated. The patient was found to have a comminuted Right subcapital femoral neck fracture. I freshened the femoral neck cut with a saw. I removed the femoral neck fragment. A corkscrew was placed into the head and the head was removed. This was passed to the back table and was measured.  Acetabular exposure was  achieved. I examined the articular cartilage which was intact. The labrum was intact. A 43 mm trial head was placed and found to have excellent fit.  I then gained femoral exposure taking care to protect the abductors and greater trochanter. This was performed using standard external rotation, extension, and adduction. The capsule was peeled off the inner aspect of the greater trochanter, taking care to preserve the short external rotators. A cookie cutter was used to enter the femoral canal, and then the femoral canal finder was used to confirm location. I then sequentially broached up to a size 5. Calcar planer was used on the femoral neck remnant. I paced a std neck and a 36+ 0 head ball.The hip was reduced. Leg lengths were checked fluoroscopically. The hip was dislocated and trial components were removed. I placed the real stem followed by the real spacer and head ball. A single reduction maneuver was performed and the hip was reduced. Fluoroscopy was used to confirm component position and leg lengths. At 90 degrees of external rotation and extension, the hip was stable to an anterior directed force.  The wound was copiously irrigated with normal saline solution. Marcaine solution was injected into the periarticular soft tissue. The wound was closed in layers using #1 Vicryl and V-Loc for the fascia, 2-0 Vicryl for the subcutaneous fat, 2-0 Monocryl for the deep dermal layer, 3-0 running Monocryl subcuticular stitch and glue for the skin. Once the glue was fully dried, an Aquacell Ag dressing was applied. The patient was then awakened from anesthesia and transported to the recovery room in stable condition. Sponge, needle, and instrument counts were correct at the end of the case x2. The patient tolerated the procedure well  and there were no known complications.

## 2016-05-08 ENCOUNTER — Encounter (HOSPITAL_COMMUNITY): Payer: Self-pay | Admitting: Orthopedic Surgery

## 2016-05-08 LAB — BASIC METABOLIC PANEL
ANION GAP: 13 (ref 5–15)
BUN: 6 mg/dL (ref 6–20)
CALCIUM: 8.9 mg/dL (ref 8.9–10.3)
CO2: 21 mmol/L — ABNORMAL LOW (ref 22–32)
Chloride: 103 mmol/L (ref 101–111)
Creatinine, Ser: 0.71 mg/dL (ref 0.44–1.00)
Glucose, Bld: 169 mg/dL — ABNORMAL HIGH (ref 65–99)
POTASSIUM: 3.7 mmol/L (ref 3.5–5.1)
SODIUM: 137 mmol/L (ref 135–145)

## 2016-05-08 LAB — GLUCOSE, CAPILLARY
GLUCOSE-CAPILLARY: 187 mg/dL — AB (ref 65–99)
GLUCOSE-CAPILLARY: 135 mg/dL — AB (ref 65–99)
Glucose-Capillary: 152 mg/dL — ABNORMAL HIGH (ref 65–99)
Glucose-Capillary: 171 mg/dL — ABNORMAL HIGH (ref 65–99)

## 2016-05-08 LAB — CBC
HCT: 30.8 % — ABNORMAL LOW (ref 36.0–46.0)
Hemoglobin: 10.6 g/dL — ABNORMAL LOW (ref 12.0–15.0)
MCH: 30.5 pg (ref 26.0–34.0)
MCHC: 34.4 g/dL (ref 30.0–36.0)
MCV: 88.8 fL (ref 78.0–100.0)
PLATELETS: 193 10*3/uL (ref 150–400)
RBC: 3.47 MIL/uL — AB (ref 3.87–5.11)
RDW: 12.9 % (ref 11.5–15.5)
WBC: 10.4 10*3/uL (ref 4.0–10.5)

## 2016-05-08 MED ORDER — MORPHINE SULFATE (PF) 2 MG/ML IV SOLN
1.0000 mg | INTRAVENOUS | Status: DC | PRN
  Administered 2016-05-08 (×2): 1 mg via INTRAVENOUS
  Filled 2016-05-08 (×2): qty 1

## 2016-05-08 NOTE — [place] FL2 (Signed)
[place] MEDICAID FL2 LEVEL OF CARE SCREENING TOOL     IDENTIFICATION  Patient Name: ANNESHA DELGRECO Birthdate: 09-09-1935 Sex: female Admission Date (Current Location): 05/04/2016  [facility] and [place] Number:  Producer, television/film/video and Address:  [facility] [place], [address], [place], [place] [address]      Provider Number: [id number]  Attending Physician Name and Address:  Maretta Bees, MD  Relative Name and Phone Number:       Current Level of Care: Hospital Recommended Level of Care: Skilled Nursing Facility Prior Approval Number:    Date Approved/Denied:   PASRR Number: [id number]  Discharge Plan: SNF    Current Diagnoses: Patient Active Problem List   Diagnosis Date Noted  . Displaced fracture of right femoral neck (HCC) 05/07/2016  . Hip fracture (HCC) 05/04/2016  . Closed right hip fracture (HCC) 05/04/2016  . Type 2 diabetes mellitus with hyperglycemia (HCC) 05/04/2016  . Dementia 05/04/2016  . Closed fracture of part of upper end of humerus 01/03/2015  . Hypothyroidism 12/19/2010  . DIABETES MELLITUS, TYPE II 12/19/2010  . DEMENTIA 12/19/2010  . ANXIETY 12/19/2010  . Essential hypertension 12/19/2010  . DIZZINESS 12/19/2010  . FATIGUE 12/19/2010  . HEADACHE 12/19/2010    Orientation RESPIRATION BLADDER Height & Weight     Self  Normal Continent Weight: 134 lb 0.6 oz (60.8 kg) Height:  5' (152.4 cm)  BEHAVIORAL SYMPTOMS/MOOD NEUROLOGICAL BOWEL NUTRITION STATUS      Continent Diet (Please see discharge summary.)  AMBULATORY STATUS COMMUNICATION OF NEEDS Skin    (Did not ambulate with PT) Verbally Surgical wounds                       Personal Care Assistance Level of Assistance  Bathing, Feeding, Dressing Bathing Assistance: Maximum assistance Feeding assistance: Limited assistance Dressing Assistance: Maximum assistance     Functional Limitations Info             SPECIAL CARE FACTORS FREQUENCY  PT (By  licensed PT), OT (By licensed OT)     PT Frequency: 5 OT Frequency: 5            Contractures      Additional Factors Info  Code Status, Allergies Code Status Info: DNR Allergies Info: Penicillins, Promethazine Hcl, Sulfonamide Derivatives           Current Medications (05/08/2016):  This is the current hospital active medication list Current Facility-Administered Medications  Medication Dose Route Frequency Provider Last Rate Last Dose  . acetaminophen (TYLENOL) tablet 650 mg  650 mg Oral Q6H PRN Samson Frederic, MD   650 mg at 05/08/16 1237   Or  . acetaminophen (TYLENOL) suppository 650 mg  650 mg Rectal Q6H PRN Samson Frederic, MD      . acetaminophen (TYLENOL) tablet 1,000 mg  1,000 mg Oral Q8H Maretta Bees, MD   1,000 mg at 05/08/16 [id number]  . amLODipine (NORVASC) tablet 5 mg  5 mg Oral Daily Eduard Clos, MD   5 mg at 05/08/16 1017  . cholecalciferol (VITAMIN D) tablet 1,000 Units  1,000 Units Oral Daily Eduard Clos, MD   1,000 Units at 05/08/16 1018  . citalopram (CELEXA) tablet 20 mg  20 mg Oral Daily Eduard Clos, MD   20 mg at 05/08/16 1018  . donepezil (ARICEPT) tablet 10 mg  10 mg Oral QHS Eduard Clos, MD   10 mg at 05/07/16 2217  .  enoxaparin (LOVENOX) injection 40 mg  40 mg Subcutaneous Q24H Samson FredericBrian Swinteck, MD   40 mg at 05/08/16 0851  . feeding supplement (ENSURE ENLIVE) (ENSURE ENLIVE) liquid 237 mL  237 mL Oral BID BM Maretta BeesShanker M Ghimire, MD   237 mL at 05/08/16 1025  . insulin aspart (novoLOG) injection 0-9 Units  0-9 Units Subcutaneous TID WC Eduard ClosArshad N Kakrakandy, MD   2 Units at 05/08/16 1228  . levothyroxine (SYNTHROID, LEVOTHROID) tablet 25 mcg  25 mcg Oral QAC breakfast Eduard ClosArshad N Kakrakandy, MD   25 mcg at 05/08/16 [phone]  . lisinopril (PRINIVIL,ZESTRIL) tablet 40 mg  40 mg Oral Daily Eduard ClosArshad N Kakrakandy, MD   40 mg at 05/08/16 1023  . memantine (NAMENDA XR) 24 hr capsule 28 mg  28 mg Oral Daily Eduard ClosArshad N Kakrakandy, MD   28 mg at  05/08/16 1025  . menthol-cetylpyridinium (CEPACOL) lozenge 3 mg  1 lozenge Oral PRN Samson FredericBrian Swinteck, MD   3 mg at 05/08/16 0603   Or  . phenol (CHLORASEPTIC) mouth spray 1 spray  1 spray Mouth/Throat PRN Samson FredericBrian Swinteck, MD      . metoCLOPramide (REGLAN) tablet 5-10 mg  5-10 mg Oral Q8H PRN Samson FredericBrian Swinteck, MD       Or  . metoCLOPramide (REGLAN) injection 5-10 mg  5-10 mg Intravenous Q8H PRN Samson FredericBrian Swinteck, MD      . metoprolol tartrate (LOPRESSOR) tablet 25 mg  25 mg Oral BID Eduard ClosArshad N Kakrakandy, MD   25 mg at 05/08/16 1018  . morphine 2 MG/ML injection 1 mg  1 mg Intravenous Q4H PRN Maretta BeesShanker M Ghimire, MD   1 mg at 05/08/16 1317  . ondansetron (ZOFRAN) tablet 4 mg  4 mg Oral Q6H PRN Samson FredericBrian Swinteck, MD       Or  . ondansetron [facility](ZOFRAN) injection 4 mg  4 mg Intravenous Q6H PRN Samson FredericBrian Swinteck, MD   4 mg at 05/08/16 0600  . pantoprazole (PROTONIX) EC tablet 40 mg  40 mg Oral Q1200 Maretta BeesShanker M Ghimire, MD   40 mg at 05/08/16 1228  . polyethylene glycol (MIRALAX / GLYCOLAX) packet 17 g  17 g Oral Daily Maretta BeesShanker M Ghimire, MD   17 g at 05/08/16 1017  . pravastatin (PRAVACHOL) tablet 40 mg  40 mg Oral Daily Eduard ClosArshad N Kakrakandy, MD   40 mg at 05/08/16 1017  . senna (SENOKOT) tablet 8.6 mg  1 tablet Oral BID Samson FredericBrian Swinteck, MD   8.6 mg at 05/08/16 1017  . topiramate (TOPAMAX) tablet 50 mg  50 mg Oral BID Eduard ClosArshad N Kakrakandy, MD   50 mg at 05/08/16 1017  . traMADol (ULTRAM) tablet 50 mg  50 mg Oral Q6H PRN Samson FredericBrian Swinteck, MD   50 mg at 05/08/16 1029     Discharge Medications: Please see discharge summary for a list of discharge medications.  Relevant Imaging Results:  Relevant Lab Results:   Additional Information SSN: [id number]  Rod MaeVaughn, Narely Nobles S, LCSW

## 2016-05-08 NOTE — Progress Notes (Signed)
Occupational Therapy Evaluation Patient Details Name: [name] MRN: [id number] DOB: Aug 28, 1935 Today's Date: 05/08/2016    History of Present Illness [name] is a 81 y.o. female with medical history significant of dementia, hypertension diabetes mellitus and hyperlipidemia was brought to the ER 05/04/16 complaining of right hip pain. Patient was originally brought to the ER 10 days ago after a fall and at that time CT head was unremarkable was seen to have UTI and was treated for UTI. [name] X-rays revealed right hip fracture. S/P direct anterior  hemiarthroplasty.   Clinical Impression   PTA, pt resides at ALF and received unknown level of assistance with ADLs and used RW for mobility. Pt currently requires max-total +2 assist for bed mobility and ADLs due to R hip pain and cognitive deficits related to dementia. Pt will benefit from continued acute OT to increase independence and safety with ADLs and mobility to allow for safe discharge to the venue listed below. Recommend SNF for post-acute rehab stay and pt's daughter is in agreement. Will continue to follow acutely.    Follow Up Recommendations  SNF    Equipment Recommendations  Other (comment) (TBD in next venue)    Recommendations for Other Services       Precautions / Restrictions Precautions Precautions: Fall Restrictions Weight Bearing Restrictions: Yes RLE Weight Bearing: Weight bearing as tolerated      Mobility Bed Mobility Overal bed mobility: Needs Assistance;+2 for physical assistance;+ 2 for safety/equipment Bed Mobility: Supine to Sit;Sit to Supine     Supine to sit: Total assist;+2 for physical assistance;+2 for safety/equipment Sit to supine: Total assist;+2 for physical assistance;+2 for safety/equipment   General bed mobility comments: multimodal cues for  self assisting  with bed rail, patient is resistive throughout  moblizing to sitting. Bed pad utilized to slide trunk  around to edge and to  lift trunk into upright. Returned to supine with bed pad.  Transfers                 General transfer comment: unable to attempt due to pt's pain and cognition    Balance Overall balance assessment: Needs assistance Sitting-balance support: No upper extremity supported;Feet supported Sitting balance-Leahy Scale: Poor Sitting balance - Comments: Able to maintain balance with minimal-no support for brief periods of time. Pt abilities limited by pain at this time Postural control: Left lateral lean;Posterior lean                                  ADL                                         General ADL Comments: Able to progress pt to EOB and work on sitting balance for 5 minutes. Pt then cried out in pain and began putting herself back into supine position and declined to progress further with therapy due to severe hip pain. Daughter present for OT eval     Vision Vision Assessment?: No apparent visual deficits   Perception     Praxis      Pertinent Vitals/Pain Pain Assessment: Faces Faces Pain Scale: Hurts worst Pain Location: R hip Pain Descriptors / Indicators: Crying;Discomfort;Grimacing;Guarding Pain Intervention(s): Limited activity within patient's tolerance;Monitored during session;Repositioned;Premedicated before session     Hand Dominance     Extremity/Trunk Assessment  Upper Extremity Assessment Upper Extremity Assessment: Overall WFL for tasks assessed   Lower Extremity Assessment Lower Extremity Assessment: RLE deficits/detail;LLE deficits/detail RLE Deficits / Details: R hip is positioned in exernal rotation. Patient dioes not actively move it LLE Deficits / Details: flexed the left hip and knee while supine       Communication Communication Communication: No difficulties   Cognition Arousal/Alertness: Awake/alert Behavior During Therapy: Restless;Anxious;Flat affect Overall Cognitive Status: History of cognitive  impairments - at baseline       Memory: Decreased short-term memory             General Comments       Exercises       Shoulder Instructions      Home Living Family/patient expects to be discharged to:: Skilled nursing facility                                 Additional Comments: was living in ALF and was ambulatory with RW prior to fall per daughter. Pt received assistance with all ADLs al[place] daughter unsure how much assistance was provided.      Prior Functioning/Environment Level of Independence: Needs assistance  Gait / Transfers Assistance Needed: with RW ADL's / Homemaking Assistance Needed: ALF staff        OT Diagnosis: Generalized weakness;Cognitive deficits;Acute pain   OT Problem List: Decreased strength;Decreased range of motion;Decreased activity tolerance;Impaired balance (sitting and/or standing);Decreased cognition;Decreased safety awareness;Decreased knowledge of use of DME or AE;Pain   OT Treatment/Interventions: Self-care/ADL training;Therapeutic exercise;Energy conservation;DME and/or AE instruction;Therapeutic activities;Cognitive remediation/compensation;Patient/family education;Balance training    OT Goals(Current goals can be found in the care plan section) Acute Rehab OT Goals Patient Stated Goal: to go to [place] per daughter OT Goal Formulation: With family Time For Goal Achievement: 05/22/16 Potential to Achieve Goals: Good ADL Goals Pt Will Perform Grooming: with set-up;sitting Pt Will Perform Lower Body Bathing: with min assist;sitting/lateral leans;sit to/from stand Pt Will Perform Lower Body Dressing: with min assist;sit to/from stand;sitting/lateral leans Pt Will Transfer to Toilet: with min assist;stand pivot transfer;bedside commode Pt Will Perform Toileting - Clothing Manipulation and hygiene: with min assist;sitting/lateral leans;sit to/from stand Additional ADL Goal #1: Pt will transfer to/from wheelchair  with min assist to increase independence with transfers and self-care tasks.  OT Frequency: Min 2X/week   Barriers to D/C:            Co-evaluation PT/OT/SLP Co-Evaluation/Treatment: Yes Reason for Co-Treatment: Necessary to address cognition/behavior during functional activity;For patient/therapist safety PT goals addressed during session: Mobility/safety with mobility OT goals addressed during session: ADL's and self-care;Strengthening/ROM      End of Session Nurse Communication: Mobility status  Activity Tolerance: Patient limited by pain Patient left: in bed;with call bell/phone within reach;with bed alarm set;with family/visitor present;with SCD's reapplied   Time: 1211-1223 OT Time Calculation (min): 12 min Charges:  OT General Charges $OT Visit: 1 Procedure OT Evaluation $OT Eval High Complexity: 1 Procedure G-Codes:    Nils PyleJulia Zakirah Weingart, OTR/L Pager: [phone]: [phone] 05/08/2016, 4:07 PM

## 2016-05-08 NOTE — Clinical Social Work Placement (Signed)
   CLINICAL SOCIAL WORK PLACEMENT  NOTE  Date:  05/08/2016  Patient Details  Name: [name] MRN: [id number] Date of Birth: 06/12/1935  Clinical Social Work is seeking post-discharge placement for this patient at the Skilled  Nursing Facility level of care (*CSW will initial, date and re-position this form in  chart as items are completed):  Yes   Patient/family provided with [place] Clinical Social Work Department's list of facilities offering this level of care within the geographic area requested by the patient (or if unable, by the patient's family).  Yes   Patient/family informed of their freedom to choose among providers that offer the needed level of care, that participate in Medicare, Medicaid or managed care program needed by the patient, have an available bed and are willing to accept the patient.  Yes   Patient/family informed of [facility] ownership interest [facility] and [facility], as well as of the fact that they are under no obligation to receive care at these facilities.  PASRR submitted to EDS on 05/08/16     PASRR number received on 05/08/16     Existing PASRR number confirmed on       FL2 transmitted to all facilities in geographic area requested by pt/family on 05/08/16     FL2 transmitted to all facilities within larger geographic area on       Patient informed that his/her managed care company has contracts with or will negotiate with certain facilities, including the following:            Patient/family informed of bed offers received.  Patient chooses bed at       Physician recommends and patient chooses bed at      Patient to be transferred to   on  .  Patient to be transferred to facility by       Patient family notified on   of transfer.  Name of family member notified:        PHYSICIAN Please sign FL2     Additional Comment:    _______________________________________________ Rod MaeVaughn, Braelee Herrle S, LCSW 05/08/2016, 2:19  PM

## 2016-05-08 NOTE — Progress Notes (Signed)
PROGRESS NOTE        PATIENT DETAILS Name: [name] Age: 81 y.o. Sex: female Date of Birth: 1935/01/10 Admit Date: 05/04/2016 Admitting Physician Eddie North, MD JXB:JYNWG, Myrene Galas, MD Outpatient Specialists: None  Brief Narrative: Patient is a 81 y.o. female of dementia, hypertension, type 2 diabetes who presented with right hip pain, further workup revealed right hip fracture.Underwent right hip repair on 5/8, plans are for discharge to SNF in the next day or so.  Subjective: Complains for pain in her right hip area, no abdominal pain today. Daughter at bedside  Assessment/Plan: Principal Problem: Closed right hip fracture: Likely secondary to a mechanical fall that the patient sustained a few days prior to this admission at skilled nursing facility. Seen by orthopedics, underwent right hip hemiarthroplasty on 5/8.PT eval pending-susepct will require SNF.   Active Problems: Abd pain:Likely Gastritis. Resolved, XRay Abd on 5/7 with no acute abnormalities. Continue PPI. Abd is soft and non tender today  Hypertension: Controlled, continue metoprolol, amlodipine and lisinopril.  Type 2 diabetes: CBGs stable with SSI, continue to hold oral hypoglycemic agents.  Hypothyroidism: Continue Synthroid  Dyslipidemia: Continue statin  Dementia with mild delirium: Mostly pleasantly confused, continue Aricept.  DVT Prophylaxis: SCD's  Code Status:  DNR  Family Communication: Daughter  at bedside  Disposition Plan: Remain inpatient- SNF on discharge  Antimicrobial agents: None  Procedures: None  CONSULTS:  orthopedic surgery  Time spent: 25 minutes-Greater than 50% of this time was spent in counseling, explanation of diagnosis, planning of further management, and coordination of care.  MEDICATIONS: Anti-infectives    Start     Dose/Rate Route Frequency Ordered Stop   05/08/16 0900  vancomycin (VANCOCIN) IVPB 1000 mg/200 mL premix     1,000 mg 200 mL/hr over 60 Minutes Intravenous To Short Stay 05/07/16 1602 05/07/16 1811   05/08/16 0500  vancomycin (VANCOCIN) IVPB 1000 mg/200 mL premix     1,000 mg 200 mL/hr over 60 Minutes Intravenous Every 12 hours 05/07/16 1957 05/08/16 0704   05/07/16 1604  vancomycin (VANCOCIN) 1-5 GM/200ML-% IVPB    Comments:  Shireen Quan   : cabinet override      05/07/16 1604 05/08/16 0414      Scheduled Meds: . acetaminophen  1,000 mg Oral Q8H  . amLODipine  5 mg Oral Daily  . cholecalciferol  1,000 Units Oral Daily  . citalopram  20 mg Oral Daily  . donepezil  10 mg Oral QHS  . enoxaparin (LOVENOX) injection  40 mg Subcutaneous Q24H  . feeding supplement (ENSURE ENLIVE)  237 mL Oral BID BM  . insulin aspart  0-9 Units Subcutaneous TID WC  . levothyroxine  25 mcg Oral QAC breakfast  . lisinopril  40 mg Oral Daily  . memantine  28 mg Oral Daily  . metoprolol tartrate  25 mg Oral BID  . pantoprazole  40 mg Oral Q1200  . polyethylene glycol  17 g Oral Daily  . pravastatin  40 mg Oral Daily  . senna  1 tablet Oral BID  . topiramate  50 mg Oral BID   Continuous Infusions:  PRN Meds:.acetaminophen **OR** acetaminophen, menthol-cetylpyridinium **OR** phenol, metoCLOPramide **OR** metoCLOPramide (REGLAN) injection, morphine injection, ondansetron **OR** ondansetron (ZOFRAN) IV, traMADol   PHYSICAL EXAM: Vital signs: Filed Vitals:   05/07/16 1930 05/07/16 1932 05/07/16 1943 05/08/16 0050  BP: 143/73  158/74 157/76 144/59  Pulse: 111 107 103 81  Temp:  98.2 F (36.8 C) 98.6 F (37 C) 98.9 F (37.2 C)  TempSrc:   Oral   Resp: 17 18 17 17   Height:      Weight:      SpO2: 94% 95% 96% 97%   Filed Weights   05/04/16 1330 05/04/16 2112  Weight: 65.772 kg (145 lb) 60.8 kg (134 lb 0.6 oz)   Body mass index is 26.18 kg/(m^2).   Gen Exam: Awake,somewhat confused. She looks chronically ill. Neck: Supple, No JVD.   Chest: B/L Clear.   CVS: S1 S2 Regular Abdomen: soft, BS +,non  tender this am. Extremities: no edema, lower extremities warm to touch. Neurologic: Non Focal.   Skin: No Rash or lesions  Wounds: N/A.    LABORATORY DATA: CBC:  Recent Labs Lab 05/04/16 1420 05/04/16 2248 05/05/16 0628 05/08/16 0626  WBC 7.3 7.4 5.9 10.4  NEUTROABS 5.0  --  3.0  --   HGB 12.2 12.0 11.6* 10.6*  HCT 34.9* 35.3* 34.2* 30.8*  MCV 87.0 86.1 87.5 88.8  PLT 170 193 178 193    Basic Metabolic Panel:  Recent Labs Lab 05/04/16 1420 05/04/16 2248 05/05/16 0628 05/08/16 0626  NA 140  --  139 137  K 3.4*  --  3.5 3.7  CL 109  --  105 103  CO2 23  --  21* 21*  GLUCOSE 231*  --  157* 169*  BUN 13  --  13 6  CREATININE 0.70 0.72 0.78 0.71  CALCIUM 9.3  --  9.2 8.9    GFR: Estimated Creatinine Clearance: 47.2 mL/min (by C-G formula based on Cr of 0.71).  Liver Function Tests:  Recent Labs Lab 05/04/16 1420 05/05/16 0628  AST 17 69*  ALT 17 49  ALKPHOS 69 64  BILITOT 0.6 0.9  PROT 6.4* 5.9*  ALBUMIN 3.3* 2.9*   No results for input(s): LIPASE, AMYLASE in the last 168 hours. No results for input(s): AMMONIA in the last 168 hours.  Coagulation Profile:  Recent Labs Lab 05/04/16 1420  INR 1.10    Cardiac Enzymes: No results for input(s): CKTOTAL, CKMB, CKMBINDEX, TROPONINI in the last 168 hours.  BNP (last 3 results) No results for input(s): PROBNP in the last 8760 hours.  HbA1C: No results for input(s): HGBA1C in the last 72 hours.  CBG:  Recent Labs Lab 05/07/16 1534 05/07/16 1917 05/07/16 2226 05/08/16 0652 05/08/16 1154  GLUCAP 115* 187* 196* 187* 152*    Lipid Profile: No results for input(s): CHOL, HDL, LDLCALC, TRIG, CHOLHDL, LDLDIRECT in the last 72 hours.  Thyroid Function Tests: No results for input(s): TSH, T4TOTAL, FREET4, T3FREE, THYROIDAB in the last 72 hours.  Anemia Panel: No results for input(s): VITAMINB12, FOLATE, FERRITIN, TIBC, IRON, RETICCTPCT in the last 72 hours.  Urine analysis:    Component  Value Date/Time   COLORURINE YELLOW 05/04/2016 1517   APPEARANCEUR CLEAR 05/04/2016 1517   LABSPEC 1.010 05/04/2016 1517   PHURINE 6.5 05/04/2016 1517   GLUCOSEU NEGATIVE 05/04/2016 1517   HGBUR NEGATIVE 05/04/2016 1517   HGBUR negative 12/19/2010 1223   BILIRUBINUR NEGATIVE 05/04/2016 1517   KETONESUR NEGATIVE 05/04/2016 1517   PROTEINUR NEGATIVE 05/04/2016 1517   UROBILINOGEN 0.2 06/14/2015 2048   NITRITE NEGATIVE 05/04/2016 1517   LEUKOCYTESUR NEGATIVE 05/04/2016 1517    Sepsis Labs: Lactic Acid, Venous    Component Value Date/Time   LATICACIDVEN 1.49 04/24/2016 2044    MICROBIOLOGY:  Recent Results (from the past 240 hour(s))  Urine culture     Status: None   Collection Time: 05/04/16  3:17 PM  Result Value Ref Range Status   Specimen Description URINE, CATHETERIZED  Final   Special Requests NONE  Final   Culture   Final    NO GROWTH 2 DAYS Performed at [facility]    Report Status 05/06/2016 FINAL  Final  Surgical PCR screen     Status: None   Collection Time: 05/05/16  6:17 AM  Result Value Ref Range Status   MRSA, PCR NEGATIVE NEGATIVE Final   Staphylococcus aureus NEGATIVE NEGATIVE Final    Comment:        The Xpert SA Assay (FDA approved for NASAL specimens in patients over 79 years of age), is one component of a comprehensive surveillance program.  Test performance has been validated by [facility] for patients greater than or equal to 79 year old. It is not intended to diagnose infection nor to guide or monitor treatment.     RADIOLOGY STUDIES/RESULTS: Dg Chest 1 View  05/04/2016  CLINICAL DATA:  Pain following fall 3 days prior EXAM: CHEST 1 VIEW COMPARISON:  April 24, 2016 FINDINGS: There is no edema or consolidation. Heart is upper normal in size with pulmonary vascularity within normal limits. No adenopathy. No pneumothorax. No bone lesions. IMPRESSION: No edema or consolidation. Electronically Signed   By: Bretta BangWilliam  Woodruff III M.D.   On:  05/04/2016 14:56   Dg Chest 2 View  04/24/2016  CLINICAL DATA:  Acute onset of altered mental status. Initial encounter. EXAM: CHEST  2 VIEW COMPARISON:  Chest radiograph from 12/07/2015 FINDINGS: The lungs are well-aerated. There is mild elevation of the right hemidiaphragm. Mild peribronchial thickening is noted. There is no evidence of focal opacification, pleural effusion or pneumothorax. The heart is borderline normal in size. No acute osseous abnormalities are seen. IMPRESSION: Mild elevation of the right hemidiaphragm. Mild peribronchial thickening noted. Electronically Signed   By: Roanna RaiderJeffery  Chang M.D.   On: 04/24/2016 20:08   Ct Head Wo Contrast  04/24/2016  CLINICAL DATA:  Altered mental status EXAM: CT HEAD WITHOUT CONTRAST TECHNIQUE: Contiguous axial images were obtained from the base of the skull through the vertex without intravenous contrast. COMPARISON:  12/07/2015 FINDINGS: Bony calvarium is intact. Diffuse atrophic changes are noted. No findings to suggest acute hemorrhage, acute infarction or space-occupying mass lesion are noted. IMPRESSION: Atrophic changes without acute abnormality. Electronically Signed   By: Alcide CleverMark  Lukens M.D.   On: 04/24/2016 20:06   Pelvis Portable  05/07/2016  CLINICAL DATA:  Postop from right hip arthroplasty. EXAM: PORTABLE PELVIS 1-2 VIEWS COMPARISON:  None. FINDINGS: Right hip prosthesis seen in appropriate position. No evidence of fracture or dislocation. IMPRESSION: Expected postoperative appearance of right hip prosthesis. No complication identified. Electronically Signed   By: Myles RosenthalJohn  Stahl M.D.   On: 05/07/2016 21:49   Dg Abd Portable 1v  05/06/2016  CLINICAL DATA:  Patient with history of right hip fracture. Mid and lower abdominal pain. EXAM: PORTABLE ABDOMEN - 1 VIEW COMPARISON:  Right lower extremity radiograph 05/04/2016 FINDINGS: Gas is demonstrated within nondilated loops of large and small bowel in a nonobstructed pattern. Lumbar spine degenerative  changes. Lumbar spinal fusion hardware. Known right hip fracture not well demonstrated on current exam. Pelvic phleboliths. IMPRESSION: Nonobstructed bowel gas pattern. Electronically Signed   By: Annia Beltrew  Davis M.D.   On: 05/06/2016 12:11   Dg Hip Unilat With Pelvis  1v Right  05/04/2016  CLINICAL DATA:  Pain following fall EXAM: DG HIP (WITH OR WITHOUT PELVIS) 1V RIGHT COMPARISON:  None. FINDINGS: Frontal pelvis as well as frontal and lateral right hip images were obtained. There is evidence suggesting impaction along the lateral subcapital region of the right femoral neck. No other evidence of fracture. No dislocation. Joint spaces appear normal. Bones are diffusely osteoporotic. IMPRESSION: Evidence of an impaction type injury along the subcapital femoral neck on the right. No other evidence of fracture. No dislocation. No appreciable arthropathic change. Bones diffusely osteoporotic. Electronically Signed   By: Bretta Bang III M.D.   On: 05/04/2016 14:57   Dg Hip Operative Unilat W Or W/o Pelvis Right  05/07/2016  CLINICAL DATA:  Right hemiarthroplasty EXAM: OPERATIVE RIGHT HIP (WITH PELVIS IF PERFORMED) 2 VIEWS TECHNIQUE: Fluoroscopic spot image(s) were submitted for interpretation post-operatively. COMPARISON:  05/04/2016 FINDINGS: Changes of right hip replacement. Normal AP alignment. No visible complicating feature. IMPRESSION: Right hip replacement without visible complicating feature. Electronically Signed   By: Charlett Nose M.D.   On: 05/07/2016 18:45     LOS: 4 days   Jeoffrey Massed, MD  [facility] Pager:[phone]  If 7PM-7AM, please contact night-coverage [email or web address] Password [email or web address] 05/08/2016, 12:47 PM

## 2016-05-08 NOTE — Clinical Social Work Note (Signed)
Clinical Social Work Assessment  Patient Details  Name: Tara PerkingBeverly J Crespin MRN: [id number] Date of Birth: 06/11/1935  Date of referral:  05/08/16               Reason for consult:  Facility Placement, Discharge Planning                Permission sought to share information with:  Facility Medical sales representativeContact Representative, Family Supports Permission granted to share information::  Yes, Verbal Permission Granted  Name::     Laure KidneyDebra Weaver  Agency::  [facility] (pref[place] area)  Relationship::  Daughter  Contact Information:  [phone]  Housing/Transportation Living arrangements for the past 2 months:  Assisted Living Facility [facility] Source of Information:  Adult Children Patient Interpreter Needed:  None Criminal Activity/Legal Involvement Pertinent to Current Situation/Hospitalization:  No - Comment as needed Significant Relationships:  Adult Children Lives with:  Facility Resident Do you feel safe going back to the place where you live?  No Need for family participation in patient care:  Yes (Comment) (Patient with dementia. CSW spoke with patient's daughter.)  Care giving concerns:  Patient's daughter expressed to LCSW that patient's facility [facility] would not be able to provide skilled care for patient.   Social Worker assessment / plan:  LCSW received referral for possible SNF placement at time of discharge. LCSW spoke with patient's daughter regarding discharge planning. Per patient's daughter, family would prefer for patient to be placed [place] with a specific preference for [facility]. LCSW informed patient's daughter that [facility] currently has no bed availability and projects to be full until the end of next week. Patient's daughter expressed understanding and is agreeable to LCSW completing SN[place] [place]. LCSW to continue to follow and assist with discharge planning needs.  Employment  status:  Retired Health and safety inspectornsurance information:  [employer] PT Recommendations:  [facility], Not assessed at this time Information / Referral to community resources:  Skilled Nursing Facility  Patient/Family's Response to care:  Patient's family understanding and agreeable to Johnson [employer] JohnsonLCSW plan of care.  Patient/Family's Understanding of and Emotional Response to Diagnosis, Current Treatment, and Prognosis:  Patient's family understanding and agreeable to LCSW plan of care.  Emotional Assessment Appearance:   (Patient with dementia. CSW spoke with patient's daughter.) Attitude/Demeanor/Rapport:   (Patient with dementia. CSW spoke with patient's daughter.) Affect (typically observed):   (Patient with dementia. CSW spoke with patient's daughter.) Orientation:    Alcohol / Substance use:  Not Applicable Psych involvement (Current and /or in the community):  No (Comment) (Not appropriate on this admission.)  Discharge Needs  Concerns to be addressed:  No discharge needs identified Readmission within the last 30 days:  No Current discharge risk:  None Barriers to Discharge:  No Barriers Identified   Rod MaeVaughn, Tamorah Hada S, LCSW 05/08/2016, 12:37 PM [phone]

## 2016-05-08 NOTE — Evaluation (Signed)
Physical Therapy Evaluation Patient Details Name: [name] MRN: [id number] DOB: [birth year] Today's Date: 05/08/2016   History of Present Illness  [name] is a 81 y.o. female with medical history significant of dementia, hypertension diabetes mellitus and hyperlipidemia was brought to the ER 05/04/16 complaining of right hip pain. Patient was originally brought to the ER 10 days ago after a fall and at that time CT head was unremarkable , found to have a UTI and was treated. . X-rays revealed right hip fracture. S/P direct anterior  Hemiarthroplasty 05/07/16.  Clinical Impression  The patient requires 2 person assist to sitting on th bed edge.C/O increased pain of the  Right hip  with mobility. Pt admitted with above diagnosis. Pt currently with functional limitations due to the deficits listed below (see PT Problem List).  Pt will benefit from skilled PT to increase their independence and safety with mobility to allow discharge to the venue listed below.       Follow Up Recommendations SNF;Supervision/Assistance - 24 hour    Equipment Recommendations  None recommended by PT    Recommendations for Other Services       Precautions / Restrictions Precautions Precautions: Fall Restrictions Weight Bearing Restrictions: Yes RLE Weight Bearing: Weight bearing as tolerated      Mobility  Bed Mobility Overal bed mobility: Needs Assistance;+2 for physical assistance;+ 2 for safety/equipment Bed Mobility: Supine to Sit;Sit to Supine     Supine to sit: Total assist;+2 for physical assistance;+2 for safety/equipment Sit to supine: Total assist;+2 for physical assistance;+2 for safety/equipment   General bed mobility comments: multimodal cues for  self assisting  with bed rail, patient is resistive throughout  moblizing to sitting. Bed pad utilized to slide trunk  around to edge and to lift trunk into upright. Returned to supine with bed pad.  Transfers                  General transfer comment: unable to attempt  Ambulation/Gait                Stairs            Wheelchair Mobility    Modified Rankin (Stroke Patients Only)       Balance Overall balance assessment: History of Falls;Needs assistance Sitting-balance support: Feet supported;Bilateral upper extremity supported Sitting balance-Leahy Scale: Zero Sitting balance - Comments: pushes back ward. at times held self upright for few seconds. Postural control: Posterior lean                                   Pertinent Vitals/Pain Pain Assessment: Faces Faces Pain Scale: Hurts worst Pain Location: R hip Pain Descriptors / Indicators: Crying;Discomfort;Grimacing;Guarding Pain Intervention(s): Limited activity within patient's tolerance;Monitored during session;Premedicated before session;Ice applied;Repositioned    Home Living Family/patient expects to be discharged to:: Skilled nursing facility                 Additional Comments: was living in ALF and was ambulatory with RW prior to fall per daughter    Prior Function Level of Independence: Needs assistance   Gait / Transfers Assistance Needed: with RW  ADL's / Homemaking Assistance Needed: ALF staff        Hand Dominance        Extremity/Trunk Assessment   Upper Extremity Assessment: Defer to OT evaluation           Lower Extremity  Assessment: RLE deficits/detail;LLE deficits/detail RLE Deficits / Details: R hip is positioned in exernal rotation. Patient dioes not actively move it LLE Deficits / Details: flexed the left hip and knee while supine     Communication      Cognition Arousal/Alertness: Awake/alert Behavior During Therapy: Restless;Anxious;Flat affect Overall Cognitive Status: History of cognitive impairments - at baseline                      General Comments      Exercises        Assessment/Plan    PT Assessment Patient needs continued PT services   PT Diagnosis Difficulty walking;Generalized weakness;Acute pain;Altered mental status   PT Problem List Decreased strength;Decreased range of motion;Decreased activity tolerance;Decreased balance;Decreased mobility;Decreased cognition;Decreased knowledge of precautions;Decreased safety awareness;Decreased knowledge of use of DME;Pain  PT Treatment Interventions DME instruction;Gait training;Functional mobility training;Therapeutic activities;Therapeutic exercise;Patient/family education   PT Goals (Current goals can be found in the Care Plan section) Acute Rehab PT Goals Patient Stated Goal: to go to [facility] per daughter PT Goal Formulation: With family Time For Goal Achievement: 05/22/16 Potential to Achieve Goals: Fair    Frequency Min 3X/week   Barriers to discharge        Co-evaluation PT/OT/SLP Co-Evaluation/Treatment: Yes Reason for Co-Treatment: Necessary to address cognition/behavior during functional activity;For patient/therapist safety PT goals addressed during session: Mobility/safety with mobility OT goals addressed during session: ADL's and self-care       End of Session   Activity Tolerance: Patient limited by pain Patient left: in bed;with call bell/phone within reach;with bed alarm set;with family/visitor present Nurse Communication: Mobility status;Need for lift equipment         Time: 1205-1225 PT Time Calculation (min) (ACUTE ONLY): 20 min   Charges:   PT Evaluation $PT Eval Low Complexity: 1 Procedure     PT G CodesRada Hay:        Kaitlyn Skowron Elizabeth 05/08/2016, 12:48 PM Blanchard KelchKaren Amery Minasyan PT [phone]

## 2016-05-08 NOTE — Progress Notes (Signed)
   Subjective:  Patient reports pain as mild to moderate.  No c/o.  Objective:   VITALS:   Filed Vitals:   05/07/16 1930 05/07/16 1932 05/07/16 1943 05/08/16 0050  BP: 143/73 158/74 157/76 144/59  Pulse: 111 107 103 81  Temp:  98.2 F (36.8 C) 98.6 F (37 C) 98.9 F (37.2 C)  TempSrc:   Oral   Resp: 17 18 17 17   Height:      Weight:      SpO2: 94% 95% 96% 97%    NAD ABD soft Sensation intact distally Intact pulses distally Dorsiflexion/Plantar flexion intact Incision: dressing C/D/I Compartment soft    Lab Results  Component Value Date   WBC 10.4 05/08/2016   HGB 10.6* 05/08/2016   HCT 30.8* 05/08/2016   MCV 88.8 05/08/2016   PLT 193 05/08/2016   BMET    Component Value Date/Time   NA 139 05/05/2016 0628   K 3.5 05/05/2016 0628   CL 105 05/05/2016 0628   CO2 21* 05/05/2016 0628   GLUCOSE 157* 05/05/2016 0628   BUN 13 05/05/2016 0628   CREATININE 0.78 05/05/2016 0628   CALCIUM 9.2 05/05/2016 0628   GFRNONAA >60 05/05/2016 0628   GFRAA >60 05/05/2016 0628     Assessment/Plan: 1 Day Post-Op   Principal Problem:   Closed right hip fracture (HCC) Active Problems:   Hypothyroidism   Essential hypertension   Type 2 diabetes mellitus with hyperglycemia (HCC)   Dementia   Displaced fracture of right femoral neck (HCC)   WBAT RLE with walker DVT ppx: lovenox x30 days PT/OT PO pain control D/C planning   Jeraldean Wechter, Cloyde ReamsBrian James 05/08/2016, 7:28 AM   Samson FredericBrian Janiylah Hannis, MD Cell [phone]

## 2016-05-09 LAB — CBC
HCT: 29.8 % — ABNORMAL LOW (ref 36.0–46.0)
Hemoglobin: 10 g/dL — ABNORMAL LOW (ref 12.0–15.0)
MCH: 29.7 pg (ref 26.0–34.0)
MCHC: 33.6 g/dL (ref 30.0–36.0)
MCV: 88.4 fL (ref 78.0–100.0)
PLATELETS: 188 10*3/uL (ref 150–400)
RBC: 3.37 MIL/uL — ABNORMAL LOW (ref 3.87–5.11)
RDW: 13.1 % (ref 11.5–15.5)
WBC: 10.2 10*3/uL (ref 4.0–10.5)

## 2016-05-09 LAB — GLUCOSE, CAPILLARY
Glucose-Capillary: 156 mg/dL — ABNORMAL HIGH (ref 65–99)
Glucose-Capillary: 170 mg/dL — ABNORMAL HIGH (ref 65–99)

## 2016-05-09 LAB — BASIC METABOLIC PANEL
ANION GAP: 12 (ref 5–15)
BUN: 9 mg/dL (ref 6–20)
CALCIUM: 9.1 mg/dL (ref 8.9–10.3)
CO2: 19 mmol/L — ABNORMAL LOW (ref 22–32)
Chloride: 110 mmol/L (ref 101–111)
Creatinine, Ser: 0.84 mg/dL (ref 0.44–1.00)
GFR calc Af Amer: >60 mL/min (ref >60)
GLUCOSE: 175 mg/dL — AB (ref 65–99)
Potassium: 3.5 mmol/L (ref 3.5–5.1)
SODIUM: 141 mmol/L (ref 135–145)

## 2016-05-09 MED ORDER — AMLODIPINE BESYLATE 5 MG PO TABS: 10.0000 mg | ORAL_TABLET | Freq: Every day | ORAL | Status: DC

## 2016-05-09 MED ORDER — SENNA 8.6 MG PO TABS: 1.0000 | ORAL_TABLET | Freq: Every day | ORAL | Status: DC | PRN

## 2016-05-09 MED ORDER — TRAMADOL HCL 50 MG PO TABS: 50.0000 mg | ORAL_TABLET | Freq: Four times a day (QID) | ORAL | Status: DC | PRN

## 2016-05-09 MED ORDER — IBUPROFEN 800 MG PO TABS: 800.0000 mg | ORAL_TABLET | Freq: Three times a day (TID) | ORAL | Status: DC | PRN

## 2016-05-09 MED ORDER — PANTOPRAZOLE SODIUM 40 MG PO TBEC: 40.0000 mg | DELAYED_RELEASE_TABLET | Freq: Every day | ORAL | Status: DC

## 2016-05-09 MED ORDER — ENSURE ENLIVE PO LIQD
237.0000 mL | Freq: Two times a day (BID) | ORAL | Status: AC
Start: 2016-05-09 — End: ?

## 2016-05-09 MED ORDER — POLYETHYLENE GLYCOL 3350 17 G PO PACK: 17.0000 g | PACK | Freq: Every day | ORAL | Status: DC

## 2016-05-09 MED ORDER — ENOXAPARIN SODIUM 40 MG/0.4ML ~~LOC~~ SOLN: 40.0000 mg | SUBCUTANEOUS | Status: DC

## 2016-05-09 NOTE — Clinical Social Work Note (Signed)
Bed offers provided to daughter, Tara KidneyDebra Lloyd. Patient's daughter to review SNF list and contact LCSW with choice. LCSW updated patient's daughter regarding patient's discharge date.  Marcelline Deistmily Kember Boch, LCSW [phone] Orthopedics: [phone] Surgical: [phone]

## 2016-05-09 NOTE — Progress Notes (Signed)
[facility]. Report given to [employer]- Supervisor of the facility using SBAR. All questions were answered.

## 2016-05-09 NOTE — Care Management Important Message (Signed)
Important Message  Patient Details  Name: [name] MRN: [id number] Date of Birth: 02/04/1935   Medicare Important Message Given:  Yes    [name] 05/09/2016, 1:14 PM

## 2016-05-09 NOTE — Progress Notes (Signed)
   Subjective:  Patient reports pain as mild to moderate.  No c/o.  Objective:   VITALS:   Filed Vitals:   05/08/16 0050 05/08/16 1333 05/08/16 2024 05/09/16 0444  BP: 144/59 156/84 174/78 162/62  Pulse: 81 64 83 70  Temp: 98.9 F (37.2 C) 98.3 F (36.8 C) 98.5 F (36.9 C) 98.1 F (36.7 C)  TempSrc:  Oral Oral Oral  Resp: 17 17 17 18   Height:      Weight:      SpO2: 97% 93% 94% 94%    NAD ABD soft Sensation intact distally Intact pulses distally Dorsiflexion/Plantar flexion intact Incision: dressing C/D/I Compartment soft    Lab Results  Component Value Date   WBC 10.2 05/09/2016   HGB 10.0* 05/09/2016   HCT 29.8* 05/09/2016   MCV 88.4 05/09/2016   PLT 188 05/09/2016   BMET    Component Value Date/Time   NA 141 05/09/2016 0349   K 3.5 05/09/2016 0349   CL 110 05/09/2016 0349   CO2 19* 05/09/2016 0349   GLUCOSE 175* 05/09/2016 0349   BUN 9 05/09/2016 0349   CREATININE 0.84 05/09/2016 0349   CALCIUM 9.1 05/09/2016 0349   GFRNONAA >60 05/09/2016 0349   GFRAA >60 05/09/2016 0349     Assessment/Plan: 2 Days Post-Op   Principal Problem:   Closed right hip fracture (HCC) Active Problems:   Hypothyroidism   Essential hypertension   Type 2 diabetes mellitus with hyperglycemia (HCC)   Dementia   Displaced fracture of right femoral neck (HCC)   WBAT RLE with walker DVT ppx: lovenox x30 days PT/OT PO pain control D/C planning   Primo Innis, Cloyde ReamsBrian James 05/09/2016, 9:17 AM   Samson FredericBrian Williamson [place], MD Cell [phone]

## 2016-05-09 NOTE — Clinical Social Work Placement (Signed)
   CLINICAL SOCIAL WORK PLACEMENT  NOTE  Date:  05/09/2016  Patient Details  Name: [name] MRN: [id number] Date of Birth: 11/11/1935  Clinical Social Work is seeking post-discharge placement for this patient at the Skilled  Nursing Facility level of care (*CSW will initial, date and re-position this form in  chart as items are completed):  Yes   Patient/family provided with [facility] Clinical Social Work Department's list of facilities offering this level of care within the geographic area requested by the patient (or if unable, by the patient's family).  Yes   Patient/family informed of their freedom to choose among providers that offer the needed level of care, that participate in Medicare, Medicaid or managed care program needed by the patient, have an available bed and are willing to accept the patient.  Yes   Patient/family informed of [facility]'s ownership interest in [facility] and [facility], as well as of the fact that they are under no obligation to receive care at these facilities.  PASRR submitted to EDS on 05/08/16     PASRR number received on 05/08/16     Existing PASRR number confirmed on       FL2 transmitted to all facilities in geographic area requested by pt/family on 05/08/16     FL2 transmitted to all facilities within larger geographic area on       Patient informed that his/her managed care company has contracts with or will negotiate with certain facilities, including the following:        Yes   Patient/family informed of bed offers received.  [facility]     Physician recommends and patient chooses bed at      [facility].  Patient to be transferred to facility by [facility]     Patient family notified on 05/09/16 of transfer.  Name of family member notified:  Husband and daughter     PHYSICIAN Please sign FL2     Additional Comment:     _______________________________________________ Rod MaeVaughn, Bobbye Petti S, LCSW 05/09/2016, 2:29 PM

## 2016-05-09 NOTE — Discharge Summary (Signed)
PATIENT DETAILS Name: [name] Age: 81 y.o. Sex: female Date of Birth: 13-May-1935 MRN: [id number]. Admitting Physician: Eddie North, MD WUJ:[id number], Myrene Galas, MD  Admit Date: 05/04/2016 Discharge date: 05/09/2016  Recommendations for Outpatient Follow-up:  1. 30 days of Lovenox from 05/07/16 2. Ensure follow up with Orthopedics 3. Please repeat CBC/BMET at next visit  PRIMARY DISCHARGE DIAGNOSIS:  Principal Problem:   Closed right hip fracture (HCC) Active Problems:   Hypothyroidism   Essential hypertension   Type 2 diabetes mellitus with hyperglycemia (HCC)   Dementia   Displaced fracture of right femoral neck (HCC)      PAST MEDICAL HISTORY: Past Medical History  Diagnosis Date  . Diabetes mellitus   . Hypertension   . Dementia   . Anxiety   . Hypercholesteremia   . Thyroid disease   . Alzheimer disease     per facility paperwork dated 05/04/2016    DISCHARGE MEDICATIONS: Current Discharge Medication List    START taking these medications   Details  enoxaparin (LOVENOX) 40 MG/0.4ML injection Inject 0.4 mLs (40 mg total) into the skin daily. For 30 days from 05/07/16 Qty: 0 Syringe    feeding supplement, ENSURE ENLIVE, (ENSURE ENLIVE) LIQD Take 237 mLs by mouth 2 (two) times daily between meals. Qty: 237 mL, Refills: 12    pantoprazole (PROTONIX) 40 MG tablet Take 1 tablet (40 mg total) by mouth daily.    polyethylene glycol (MIRALAX / GLYCOLAX) packet Take 17 g by mouth daily.    senna (SENOKOT) 8.6 MG TABS tablet Take 1 tablet (8.6 mg total) by mouth daily as needed for mild constipation.    traMADol (ULTRAM) 50 MG tablet Take 1 tablet (50 mg total) by mouth every 6 (six) hours as needed for moderate pain. Qty: 30 tablet, Refills: 0      CONTINUE these medications which have CHANGED   Details  amLODipine (NORVASC) 5 MG tablet Take 2 tablets (10 mg total) by mouth daily.    ibuprofen (ADVIL,MOTRIN) 800 MG tablet Take 1 tablet (800 mg total) by  mouth every 8 (eight) hours as needed.      CONTINUE these medications which have NOT CHANGED   Details  acetaminophen (TYLENOL) 325 MG tablet Take 650 mg by mouth 3 (three) times daily.    Artificial Tear Solution (SOOTHE XP OP) Apply 1 drop to eye 2 (two) times daily.    cholecalciferol (VITAMIN D) 1000 UNITS tablet Take 1,000 Units by mouth daily.    citalopram (CELEXA) 20 MG tablet Take 20 mg by mouth daily.    donepezil (ARICEPT) 10 MG tablet Take 10 mg by mouth at bedtime.    levothyroxine (SYNTHROID, LEVOTHROID) 25 MCG tablet Take 25 mcg by mouth daily before breakfast.    lisinopril (PRINIVIL,ZESTRIL) 40 MG tablet Take 40 mg by mouth daily.    Memantine HCl ER (NAMENDA XR) 28 MG CP24 Take 28 mg by mouth daily.    metFORMIN (GLUCOPHAGE) 500 MG tablet Take 250 mg by mouth 2 (two) times daily with a meal.    metoprolol tartrate (LOPRESSOR) 25 MG tablet Take 25 mg by mouth 2 (two) times daily.    pravastatin (PRAVACHOL) 40 MG tablet Take 40 mg by mouth daily.    topiramate (TOPAMAX) 50 MG tablet Take 50 mg by mouth 2 (two) times daily.    loperamide (IMODIUM A-D) 2 MG tablet Take 2 mg by mouth 4 (four) times daily as needed for diarrhea or loose stools.  magnesium oxide (MAG-OX) 400 MG tablet Take 400 mg by mouth 2 (two) times daily.      STOP taking these medications     ciprofloxacin (CIPRO) 500 MG tablet      cloNIDine (CATAPRES) 0.1 MG tablet      meloxicam (MOBIC) 15 MG tablet      oxyCODONE-acetaminophen (PERCOCET/ROXICET) 5-325 MG per tablet         ALLERGIES:   Allergies  Allergen Reactions  . Penicillins Nausea And Vomiting  . Promethazine Hcl Nausea And Vomiting  . Sulfonamide Derivatives Other (See Comments)    unknown    BRIEF HPI:  See H&P, Labs, Consult and Test reports for all details in brief, patient a 81 y.o. female of dementia, hypertension, type 2 diabetes who presented with right hip pain, further workup revealed right hip  fracture.  CONSULTATIONS:   orthopedic surgery  PERTINENT RADIOLOGIC STUDIES: Dg Chest 1 View  05/04/2016  CLINICAL DATA:  Pain following fall 3 days prior EXAM: CHEST 1 VIEW COMPARISON:  April 24, 2016 FINDINGS: There is no edema or consolidation. Heart is upper normal in size with pulmonary vascularity within normal limits. No adenopathy. No pneumothorax. No bone lesions. IMPRESSION: No edema or consolidation. Electronically Signed   By: Bretta Bang III M.D.   On: 05/04/2016 14:56   Dg Chest 2 View  04/24/2016  CLINICAL DATA:  Acute onset of altered mental status. Initial encounter. EXAM: CHEST  2 VIEW COMPARISON:  Chest radiograph from 12/07/2015 FINDINGS: The lungs are well-aerated. There is mild elevation of the right hemidiaphragm. Mild peribronchial thickening is noted. There is no evidence of focal opacification, pleural effusion or pneumothorax. The heart is borderline normal in size. No acute osseous abnormalities are seen. IMPRESSION: Mild elevation of the right hemidiaphragm. Mild peribronchial thickening noted. Electronically Signed   By: Roanna Raider M.D.   On: 04/24/2016 20:08   Ct Head Wo Contrast  04/24/2016  CLINICAL DATA:  Altered mental status EXAM: CT HEAD WITHOUT CONTRAST TECHNIQUE: Contiguous axial images were obtained from the base of the skull through the vertex without intravenous contrast. COMPARISON:  12/07/2015 FINDINGS: Bony calvarium is intact. Diffuse atrophic changes are noted. No findings to suggest acute hemorrhage, acute infarction or space-occupying mass lesion are noted. IMPRESSION: Atrophic changes without acute abnormality. Electronically Signed   By: Alcide Clever M.D.   On: 04/24/2016 20:06   Pelvis Portable  05/07/2016  CLINICAL DATA:  Postop from right hip arthroplasty. EXAM: PORTABLE PELVIS 1-2 VIEWS COMPARISON:  None. FINDINGS: Right hip prosthesis seen in appropriate position. No evidence of fracture or dislocation. IMPRESSION: Expected  postoperative appearance of right hip prosthesis. No complication identified. Electronically Signed   By: Myles Rosenthal M.D.   On: 05/07/2016 21:49   Dg Abd Portable 1v  05/06/2016  CLINICAL DATA:  Patient with history of right hip fracture. Mid and lower abdominal pain. EXAM: PORTABLE ABDOMEN - 1 VIEW COMPARISON:  Right lower extremity radiograph 05/04/2016 FINDINGS: Gas is demonstrated within nondilated loops of large and small bowel in a nonobstructed pattern. Lumbar spine degenerative changes. Lumbar spinal fusion hardware. Known right hip fracture not well demonstrated on current exam. Pelvic phleboliths. IMPRESSION: Nonobstructed bowel gas pattern. Electronically Signed   By: Annia Belt M.D.   On: 05/06/2016 12:11   Dg Hip Unilat With Pelvis 1v Right  05/04/2016  CLINICAL DATA:  Pain following fall EXAM: DG HIP (WITH OR WITHOUT PELVIS) 1V RIGHT COMPARISON:  None. FINDINGS: Frontal pelvis as well as  frontal and lateral right hip images were obtained. There is evidence suggesting impaction along the lateral subcapital region of the right femoral neck. No other evidence of fracture. No dislocation. Joint spaces appear normal. Bones are diffusely osteoporotic. IMPRESSION: Evidence of an impaction type injury along the subcapital femoral neck on the right. No other evidence of fracture. No dislocation. No appreciable arthropathic change. Bones diffusely osteoporotic. Electronically Signed   By: Bretta BangWilliam  Woodruff III M.D.   On: 05/04/2016 14:57   Dg Hip Operative Unilat W Or W/o Pelvis Right  05/07/2016  CLINICAL DATA:  Right hemiarthroplasty EXAM: OPERATIVE RIGHT HIP (WITH PELVIS IF PERFORMED) 2 VIEWS TECHNIQUE: Fluoroscopic spot image(s) were submitted for interpretation post-operatively. COMPARISON:  05/04/2016 FINDINGS: Changes of right hip replacement. Normal AP alignment. No visible complicating feature. IMPRESSION: Right hip replacement without visible complicating feature. Electronically Signed   By:  Charlett NoseKevin  Dover M.D.   On: 05/07/2016 18:45     PERTINENT LAB RESULTS: CBC:  Recent Labs  05/08/16 0626 05/09/16 0349  WBC 10.4 10.2  HGB 10.6* 10.0*  HCT 30.8* 29.8*  PLT 193 188   CMET CMP     Component Value Date/Time   NA 141 05/09/2016 0349   K 3.5 05/09/2016 0349   CL 110 05/09/2016 0349   CO2 19* 05/09/2016 0349   GLUCOSE 175* 05/09/2016 0349   BUN 9 05/09/2016 0349   CREATININE 0.84 05/09/2016 0349   CALCIUM 9.1 05/09/2016 0349   PROT 5.9* 05/05/2016 0628   ALBUMIN 2.9* 05/05/2016 0628   AST 69* 05/05/2016 0628   ALT 49 05/05/2016 0628   ALKPHOS 64 05/05/2016 0628   BILITOT 0.9 05/05/2016 0628   GFRNONAA >60 05/09/2016 0349   GFRAA >60 05/09/2016 0349    GFR Estimated Creatinine Clearance: 45 mL/min (by C-G formula based on Cr of 0.84). No results for input(s): LIPASE, AMYLASE in the last 72 hours. No results for input(s): CKTOTAL, CKMB, CKMBINDEX, TROPONINI in the last 72 hours. Invalid input(s): POCBNP No results for input(s): DDIMER in the last 72 hours. No results for input(s): HGBA1C in the last 72 hours. No results for input(s): CHOL, HDL, LDLCALC, TRIG, CHOLHDL, LDLDIRECT in the last 72 hours. No results for input(s): TSH, T4TOTAL, T3FREE, THYROIDAB in the last 72 hours.  Invalid input(s): FREET3 No results for input(s): VITAMINB12, FOLATE, FERRITIN, TIBC, IRON, RETICCTPCT in the last 72 hours. Coags: No results for input(s): INR in the last 72 hours.  Invalid input(s): PT Microbiology: Recent Results (from the past 240 hour(s))  Urine culture     Status: None   Collection Time: 05/04/16  3:17 PM  Result Value Ref Range Status   Specimen Description URINE, CATHETERIZED  Final   Special Requests NONE  Final   Culture   Final    NO GROWTH 2 DAYS Performed at [facility]    Report Status 05/06/2016 FINAL  Final  Surgical PCR screen     Status: None   Collection Time: 05/05/16  6:17 AM  Result Value Ref Range Status   MRSA, PCR  NEGATIVE NEGATIVE Final   Staphylococcus aureus NEGATIVE NEGATIVE Final    Comment:        The Xpert SA Assay (FDA approved for NASAL specimens in patients over 79 years of age), is one component of a comprehensive surveillance program.  Test performance has been validated by [facility] for patients greater than or equal to 79 year old. It is not intended to diagnose infection nor to guide  or monitor treatment.      BRIEF HOSPITAL COURSE:  Closed right hip fracture: Likely secondary to a mechanical fall that the patient sustained a few days prior to this admission at skilled nursing facility. Seen by orthopedics, underwent right hip hemiarthroplasty on 5/8.PT evaluation completed, plans are for SNF on discharge. No major issues post op, awake and alert, shakes head yes when asked if she has some pain. Recommendations from Orthopedics are to continue prophylactic Lovenox for 30 days, WBAT as tolerated to RLE.Spoke with Dr Veda Canning today-ok for discharge from his point of view. Please ensure follow up with Orthopedics-Dr Veda Canning in 2 weels.  Active Problems: Abd pain:Likely Gastritis. Resolved, XRay Abd on 5/7 with no acute abnormalities. Continue PPI. Abd remains soft and non tender.  Mild acute blood loss anemia:Slight drop in Hb-suspect anemia in a setting of perioperative blood loss. No indication for transfusion  Hypertension: Relatively well controlled-continue metoprolol, amlodipine and lisinopril.But will increased Amlodipine to 10 mg daily. Further optimization can be deferred to the outpatient setting.   Type 2 diabetes: CBGs stable with SSI while inpatient, resume hold oral hypoglycemic agents on discharge.  Hypothyroidism: Continue Synthroid  Dyslipidemia: Continue statin  Dementia with mild delirium: Mostly pleasantly confused, continue Aricept.  TODAY-DAY OF DISCHARGE:  Subjective:   Veneda Kirksey today has no headache,no chest abdominal pain,no new weakness  tingling or numbness.  Objective:   Blood pressure 162/62, pulse 70, temperature 98.1 F (36.7 C), temperature source Oral, resp. rate 18, height 5' (1.524 m), weight 60.8 kg (134 lb 0.6 oz), SpO2 94 %.  Intake/Output Summary (Last 24 hours) at 05/09/16 1100 Last data filed at 05/09/16 0900  Gross per 24 hour  Intake     50 ml  Output      0 ml  Net     50 ml   Filed Weights   05/04/16 1330 05/04/16 2112  Weight: 65.772 kg (145 lb) 60.8 kg (134 lb 0.6 oz)    Exam Awake-mildly confused, Oriented *3, No new F.N deficits, Normal affect [place].AT,PERRAL Supple Neck,No JVD, No cervical lymphadenopathy appriciated.  Symmetrical Chest wall movement, Good air movement bilaterally, CTAB RRR,No Gallops,Rubs or new Murmurs, No Parasternal Heave +ve B.Sounds, Abd Soft, Non tender, No organomegaly appriciated, No rebound -guarding or rigidity. No Cyanosis, Clubbing or edema, No new Rash or bruise  DISCHARGE CONDITION: Stable  DISPOSITION: SNF  CODE STATUS: DNR  DISCHARGE INSTRUCTIONS:    Activity:  WBAT as tolerated to RLE  Get Medicines reviewed and adjusted: Please take all your medications with you for your next visit with your Primary MD  Please request your Primary MD to go over all hospital tests and procedure/radiological results at the follow up, please ask your Primary MD to get all Hospital records sent to his/her office.  If you experience worsening of your admission symptoms, develop shortness of breath, life threatening emergency, suicidal or homicidal thoughts you must seek medical attention immediately by calling 911 or calling your MD immediately  if symptoms less severe.  You must read complete instructions/literature along with all the possible adverse reactions/side effects for all the Medicines you take and that have been prescribed to you. Take any new Medicines after you have completely understood and accpet all the possible adverse reactions/side effects.   Do  not drive when taking Pain medications.   Do not take more than prescribed Pain, Sleep and Anxiety Medications  Special Instructions: If you have smoked or chewed Tobacco  in the last  2 yrs please stop smoking, stop any regular Alcohol  and or any Recreational drug use.  Wear Seat belts while driving.  Please note  You were cared for by a hospitalist during your hospital stay. Once you are discharged, your primary care physician will handle any further medical issues. Please note that NO REFILLS for any discharge medications will be authorized once you are discharged, as it is imperative that you return to your primary care physician (or establish a relationship with a primary care physician if you do not have one) for your aftercare needs so that they can reassess your need for medications and monitor your lab values.   Diet recommendation: Diabetic Diet Heart Healthy diet  Discharge Instructions    Call MD for:  redness, tenderness, or signs of infection (pain, swelling, redness, odor or green/yellow discharge around incision site)    Complete by:  As directed      Diet - low sodium heart healthy    Complete by:  As directed      Diet general    Complete by:  As directed      Discharge instructions    Complete by:  As directed   Check CBG's ac and hs     Increase activity slowly    Complete by:  As directed   Weight Bearing As Tolerated to RLE with walker           Follow-up Information    Follow up with Swinteck, Cloyde Reams, MD. Schedule an appointment as soon as possible for a visit in 2 weeks.   Specialty:  Orthopedic Surgery   Why:  For wound re-check   Contact information:   [address] [place] [address] [phone]       Follow up with Galvin Proffer, MD. Schedule an appointment as soon as possible for a visit in 2 weeks.   Specialty:  Internal Medicine   Why:  Hospital follow up   Contact information:   [address] [place]  [place] [address] [phone]      Total Time spent on discharge equals 45 minutes.  SignedJeoffrey Massed 05/09/2016 11:00 AM

## 2016-05-09 NOTE — Anesthesia Postprocedure Evaluation (Signed)
Anesthesia Post Note  Patient: [name]  Procedure(s) Performed: Procedure(s) (LRB): ANTERIOR APPROACH HEMI HIP ARTHROPLASTY VERSUS TOTAL HIP ARTHROPLASTY (Right)  Patient location during evaluation: PACU Anesthesia Type: General Level of consciousness: awake and alert and patient cooperative Pain management: pain level controlled Vital Signs Assessment: post-procedure vital signs reviewed and stable Respiratory status: spontaneous breathing and respiratory function stable Cardiovascular status: stable Anesthetic complications: no    Last Vitals:  Filed Vitals:   05/08/16 2024 05/09/16 0444  BP: 174/78 162/62  Pulse: 83 70  Temp: 36.9 C 36.7 C  Resp: 17 18    Last Pain:  Filed Vitals:   05/09/16 0747  PainSc: Asleep                 Cela Newcom S

## 2016-07-29 ENCOUNTER — Emergency Department (HOSPITAL_COMMUNITY): Payer: Medicare Other

## 2016-07-29 ENCOUNTER — Inpatient Hospital Stay (HOSPITAL_COMMUNITY)
Admission: EM | Admit: 2016-07-29 | Discharge: 2016-08-02 | DRG: 101 | Disposition: A | Discharge status: Skilled Nursing Facility | Payer: Medicare Other | Attending: Internal Medicine | Admitting: Internal Medicine

## 2016-07-29 ENCOUNTER — Encounter (HOSPITAL_COMMUNITY): Payer: Self-pay

## 2016-07-29 DIAGNOSIS — E1165 Type 2 diabetes mellitus with hyperglycemia: Secondary | ICD-10-CM | POA: Diagnosis not present

## 2016-07-29 DIAGNOSIS — Z882 Allergy status to sulfonamides status: Secondary | ICD-10-CM

## 2016-07-29 DIAGNOSIS — M25551 Pain in right hip: Secondary | ICD-10-CM

## 2016-07-29 DIAGNOSIS — Z8249 Family history of ischemic heart disease and other diseases of the circulatory system: Secondary | ICD-10-CM | POA: Diagnosis not present

## 2016-07-29 DIAGNOSIS — Z87891 Personal history of nicotine dependence: Secondary | ICD-10-CM

## 2016-07-29 DIAGNOSIS — I4510 Unspecified right bundle-branch block: Secondary | ICD-10-CM | POA: Diagnosis present

## 2016-07-29 DIAGNOSIS — Z96641 Presence of right artificial hip joint: Secondary | ICD-10-CM | POA: Diagnosis present

## 2016-07-29 DIAGNOSIS — I10 Essential (primary) hypertension: Secondary | ICD-10-CM | POA: Diagnosis not present

## 2016-07-29 DIAGNOSIS — Z888 Allergy status to other drugs, medicaments and biological substances status: Secondary | ICD-10-CM

## 2016-07-29 DIAGNOSIS — E876 Hypokalemia: Secondary | ICD-10-CM | POA: Diagnosis present

## 2016-07-29 DIAGNOSIS — G309 Alzheimer's disease, unspecified: Secondary | ICD-10-CM | POA: Diagnosis not present

## 2016-07-29 DIAGNOSIS — R4182 Altered mental status, unspecified: Secondary | ICD-10-CM | POA: Diagnosis present

## 2016-07-29 DIAGNOSIS — E039 Hypothyroidism, unspecified: Secondary | ICD-10-CM | POA: Diagnosis present

## 2016-07-29 DIAGNOSIS — F419 Anxiety disorder, unspecified: Secondary | ICD-10-CM | POA: Diagnosis present

## 2016-07-29 DIAGNOSIS — Z7984 Long term (current) use of oral hypoglycemic drugs: Secondary | ICD-10-CM

## 2016-07-29 DIAGNOSIS — E870 Hyperosmolality and hypernatremia: Secondary | ICD-10-CM | POA: Diagnosis present

## 2016-07-29 DIAGNOSIS — Z7401 Bed confinement status: Secondary | ICD-10-CM

## 2016-07-29 DIAGNOSIS — E038 Other specified hypothyroidism: Secondary | ICD-10-CM

## 2016-07-29 DIAGNOSIS — Z515 Encounter for palliative care: Secondary | ICD-10-CM

## 2016-07-29 DIAGNOSIS — Z79899 Other long term (current) drug therapy: Secondary | ICD-10-CM

## 2016-07-29 DIAGNOSIS — Z880 Allergy status to penicillin: Secondary | ICD-10-CM

## 2016-07-29 DIAGNOSIS — G40209 Localization-related (focal) (partial) symptomatic epilepsy and epileptic syndromes with complex partial seizures, not intractable, without status epilepticus: Principal | ICD-10-CM

## 2016-07-29 DIAGNOSIS — Z66 Do not resuscitate: Secondary | ICD-10-CM | POA: Diagnosis not present

## 2016-07-29 DIAGNOSIS — Z7189 Other specified counseling: Secondary | ICD-10-CM

## 2016-07-29 DIAGNOSIS — I639 Cerebral infarction, unspecified: Secondary | ICD-10-CM

## 2016-07-29 DIAGNOSIS — F0390 Unspecified dementia without behavioral disturbance: Secondary | ICD-10-CM | POA: Diagnosis not present

## 2016-07-29 HISTORY — DX: Nontraumatic intracranial hemorrhage, unspecified: I62.9

## 2016-07-29 HISTORY — DX: Unspecified convulsions: R56.9

## 2016-07-29 LAB — I-STAT CHEM 8, ED
BUN: 10 mg/dL (ref 6–20)
BUN: 6 mg/dL (ref 6–20)
CALCIUM ION: 1.33 mmol/L — AB (ref 1.12–1.23)
CHLORIDE: 108 mmol/L (ref 101–111)
Calcium, Ion: 1.27 mmol/L — ABNORMAL HIGH (ref 1.12–1.23)
Chloride: 112 mmol/L — ABNORMAL HIGH (ref 101–111)
Creatinine, Ser: 0.8 mg/dL (ref 0.44–1.00)
Creatinine, Ser: 0.8 mg/dL (ref 0.44–1.00)
GLUCOSE: 116 mg/dL — AB (ref 65–99)
Glucose, Bld: 111 mg/dL — ABNORMAL HIGH (ref 65–99)
HCT: 39 % (ref 36.0–46.0)
HEMATOCRIT: 35 % — AB (ref 36.0–46.0)
HEMOGLOBIN: 11.9 g/dL — AB (ref 12.0–15.0)
Hemoglobin: 13.3 g/dL (ref 12.0–15.0)
Potassium: 2.6 mmol/L — CL (ref 3.5–5.1)
Potassium: 3.1 mmol/L — ABNORMAL LOW (ref 3.5–5.1)
SODIUM: 149 mmol/L — AB (ref 135–145)
SODIUM: 149 mmol/L — AB (ref 135–145)
TCO2: 25 mmol/L (ref 0–100)
TCO2: 23 mmol/L (ref 0–100)

## 2016-07-29 LAB — COMPREHENSIVE METABOLIC PANEL
ALK PHOS: 88 U/L (ref 38–126)
ALT: 13 U/L — AB (ref 14–54)
AST: 17 U/L (ref 15–41)
Albumin: 3.7 g/dL (ref 3.5–5.0)
Anion gap: 7 (ref 5–15)
BUN: 12 mg/dL (ref 6–20)
CALCIUM: 9.8 mg/dL (ref 8.9–10.3)
CO2: 26 mmol/L (ref 22–32)
CREATININE: 0.82 mg/dL (ref 0.44–1.00)
Chloride: 111 mmol/L (ref 101–111)
Glucose, Bld: 121 mg/dL — ABNORMAL HIGH (ref 65–99)
Potassium: 2.6 mmol/L — CL (ref 3.5–5.1)
Sodium: 144 mmol/L (ref 135–145)
Total Bilirubin: 0.3 mg/dL (ref 0.3–1.2)
Total Protein: 7.3 g/dL (ref 6.5–8.1)

## 2016-07-29 LAB — RAPID URINE DRUG SCREEN, HOSP PERFORMED
AMPHETAMINES: NOT DETECTED
BARBITURATES: NOT DETECTED
Benzodiazepines: NOT DETECTED
Cocaine: NOT DETECTED
Opiates: NOT DETECTED
Tetrahydrocannabinol: NOT DETECTED

## 2016-07-29 LAB — DIFFERENTIAL
Basophils Absolute: 0.1 10*3/uL (ref 0.0–0.1)
Basophils Relative: 1 %
Eosinophils Absolute: 0.1 10*3/uL (ref 0.0–0.7)
Eosinophils Relative: 2 %
LYMPHS ABS: 2.2 10*3/uL (ref 0.7–4.0)
LYMPHS PCT: 30 %
MONO ABS: 0.8 10*3/uL (ref 0.1–1.0)
MONOS PCT: 11 %
NEUTROS ABS: 4.3 10*3/uL (ref 1.7–7.7)
Neutrophils Relative %: 56 %

## 2016-07-29 LAB — CBC
HEMATOCRIT: 38 % (ref 36.0–46.0)
Hemoglobin: 12.3 g/dL (ref 12.0–15.0)
MCH: 28.1 pg (ref 26.0–34.0)
MCHC: 32.4 g/dL (ref 30.0–36.0)
MCV: 86.8 fL (ref 78.0–100.0)
Platelets: 167 10*3/uL (ref 150–400)
RBC: 4.38 MIL/uL (ref 3.87–5.11)
RDW: 13.2 % (ref 11.5–15.5)
WBC: 7.4 10*3/uL (ref 4.0–10.5)

## 2016-07-29 LAB — PROTIME-INR
INR: 1.06
Prothrombin Time: 13.9 seconds (ref 11.4–15.2)

## 2016-07-29 LAB — ETHANOL

## 2016-07-29 LAB — URINALYSIS, ROUTINE W REFLEX MICROSCOPIC
BILIRUBIN URINE: NEGATIVE
Glucose, UA: NEGATIVE mg/dL
Hgb urine dipstick: NEGATIVE
Ketones, ur: NEGATIVE mg/dL
Leukocytes, UA: NEGATIVE
NITRITE: NEGATIVE
PROTEIN: NEGATIVE mg/dL
SPECIFIC GRAVITY, URINE: 1.02 (ref 1.005–1.030)
pH: 7 (ref 5.0–8.0)

## 2016-07-29 LAB — MAGNESIUM: MAGNESIUM: 2.1 mg/dL (ref 1.7–2.4)

## 2016-07-29 LAB — CBG MONITORING, ED: GLUCOSE-CAPILLARY: 115 mg/dL — AB (ref 65–99)

## 2016-07-29 LAB — I-STAT TROPONIN, ED: TROPONIN I, POC: 0 ng/mL (ref 0.00–0.08)

## 2016-07-29 LAB — APTT: aPTT: 32 seconds (ref 24–36)

## 2016-07-29 LAB — TSH: TSH: 1.724 u[IU]/mL (ref 0.350–4.500)

## 2016-07-29 MED ORDER — POLYVINYL ALCOHOL 1.4 % OP SOLN
1.0000 [drp] | Freq: Two times a day (BID) | OPHTHALMIC | Status: DC
  Administered 2016-07-30 – 2016-08-02 (×7): 1 [drp] via OPHTHALMIC
  Filled 2016-07-29: qty 15

## 2016-07-29 MED ORDER — ACETAMINOPHEN 500 MG PO TABS
1000.0000 mg | ORAL_TABLET | Freq: Four times a day (QID) | ORAL | Status: DC | PRN
  Administered 2016-07-30: 1000 mg via ORAL
  Filled 2016-07-29: qty 2

## 2016-07-29 MED ORDER — METOPROLOL TARTRATE 25 MG PO TABS
25.0000 mg | ORAL_TABLET | Freq: Two times a day (BID) | ORAL | Status: DC
  Administered 2016-07-30 – 2016-08-02 (×7): 25 mg via ORAL
  Filled 2016-07-29 (×7): qty 1

## 2016-07-29 MED ORDER — IOPAMIDOL (ISOVUE-370) INJECTION 76%
75.0000 mL | Freq: Once | INTRAVENOUS | Status: AC | PRN
  Administered 2016-07-29: 75 mL via INTRAVENOUS

## 2016-07-29 MED ORDER — POTASSIUM CHLORIDE 10 MEQ/100ML IV SOLN
10.0000 meq | INTRAVENOUS | Status: AC
  Administered 2016-07-29 – 2016-07-30 (×2): 10 meq via INTRAVENOUS
  Filled 2016-07-29 (×2): qty 100

## 2016-07-29 MED ORDER — MEMANTINE HCL-DONEPEZIL HCL ER 28-10 MG PO CP24: 1.0000 | ORAL_CAPSULE | Freq: Every day | ORAL | Status: DC

## 2016-07-29 MED ORDER — POTASSIUM CHLORIDE 10 MEQ/100ML IV SOLN
10.0000 meq | Freq: Once | INTRAVENOUS | Status: AC
  Administered 2016-07-29: 10 meq via INTRAVENOUS
  Filled 2016-07-29: qty 100

## 2016-07-29 MED ORDER — ENOXAPARIN SODIUM 40 MG/0.4ML ~~LOC~~ SOLN
40.0000 mg | SUBCUTANEOUS | Status: DC
  Administered 2016-07-30 – 2016-08-02 (×4): 40 mg via SUBCUTANEOUS
  Filled 2016-07-29 (×4): qty 0.4

## 2016-07-29 MED ORDER — SODIUM CHLORIDE 0.9 % IV SOLN
INTRAVENOUS | Status: DC
Start: 2016-07-29 — End: 2016-07-30
  Administered 2016-07-29: via INTRAVENOUS

## 2016-07-29 MED ORDER — LEVOTHYROXINE SODIUM 50 MCG PO TABS
25.0000 ug | ORAL_TABLET | Freq: Every day | ORAL | Status: DC
  Administered 2016-07-30 – 2016-08-02 (×4): 25 ug via ORAL
  Filled 2016-07-29 (×4): qty 1

## 2016-07-29 MED ORDER — AMLODIPINE BESYLATE 5 MG PO TABS
10.0000 mg | ORAL_TABLET | Freq: Every day | ORAL | Status: DC
  Administered 2016-07-30 – 2016-08-02 (×4): 10 mg via ORAL
  Filled 2016-07-29 (×4): qty 2

## 2016-07-29 MED ORDER — MEMANTINE HCL ER 28 MG PO CP24
28.0000 mg | ORAL_CAPSULE | Freq: Every day | ORAL | Status: DC
  Administered 2016-07-30 – 2016-08-02 (×4): 28 mg via ORAL
  Filled 2016-07-29 (×4): qty 1

## 2016-07-29 MED ORDER — LEVETIRACETAM IN NACL 1000 MG/100ML IV SOLN
1000.0000 mg | Freq: Once | INTRAVENOUS | Status: AC
  Administered 2016-07-29: 1000 mg via INTRAVENOUS
  Filled 2016-07-29: qty 100

## 2016-07-29 MED ORDER — DONEPEZIL HCL 10 MG PO TABS
10.0000 mg | ORAL_TABLET | Freq: Every day | ORAL | Status: DC
  Administered 2016-07-30 – 2016-08-02 (×4): 10 mg via ORAL
  Filled 2016-07-29 (×4): qty 1

## 2016-07-29 MED ORDER — STROKE: EARLY STAGES OF RECOVERY BOOK
Freq: Once | Status: AC
  Administered 2016-07-29: 1
  Filled 2016-07-29: qty 1

## 2016-07-29 MED ORDER — PRAVASTATIN SODIUM 40 MG PO TABS
40.0000 mg | ORAL_TABLET | Freq: Every day | ORAL | Status: DC
  Administered 2016-07-30 – 2016-08-01 (×3): 40 mg via ORAL
  Filled 2016-07-29 (×3): qty 1

## 2016-07-29 MED ORDER — POTASSIUM CHLORIDE 10 MEQ/100ML IV SOLN
INTRAVENOUS | Status: AC
  Filled 2016-07-29: qty 100

## 2016-07-29 MED ORDER — TRAMADOL HCL 50 MG PO TABS: 50.0000 mg | ORAL_TABLET | Freq: Four times a day (QID) | ORAL | Status: DC | PRN

## 2016-07-29 MED ORDER — ASPIRIN 300 MG RE SUPP
300.0000 mg | Freq: Every day | RECTAL | Status: DC
  Administered 2016-07-29 – 2016-07-31 (×3): 300 mg via RECTAL
  Filled 2016-07-29 (×3): qty 1

## 2016-07-29 MED ORDER — MAGNESIUM OXIDE 400 (241.3 MG) MG PO TABS
400.0000 mg | ORAL_TABLET | Freq: Two times a day (BID) | ORAL | Status: DC
  Administered 2016-07-30 – 2016-08-02 (×7): 400 mg via ORAL
  Filled 2016-07-29 (×7): qty 1

## 2016-07-29 MED ORDER — PANTOPRAZOLE SODIUM 40 MG PO TBEC
40.0000 mg | DELAYED_RELEASE_TABLET | Freq: Every day | ORAL | Status: DC
  Administered 2016-07-30 – 2016-08-02 (×4): 40 mg via ORAL
  Filled 2016-07-29 (×4): qty 1

## 2016-07-29 MED ORDER — INSULIN ASPART 100 UNIT/ML ~~LOC~~ SOLN
0.0000 [IU] | SUBCUTANEOUS | Status: DC
  Administered 2016-07-31: 1 [IU] via SUBCUTANEOUS
  Administered 2016-07-31 (×2): 2 [IU] via SUBCUTANEOUS
  Administered 2016-08-01: 5 [IU] via SUBCUTANEOUS
  Administered 2016-08-01 – 2016-08-02 (×4): 1 [IU] via SUBCUTANEOUS

## 2016-07-29 MED ORDER — SODIUM CHLORIDE 0.9 % IV SOLN
500.0000 mg | Freq: Two times a day (BID) | INTRAVENOUS | Status: DC
  Administered 2016-07-30: 500 mg via INTRAVENOUS
  Filled 2016-07-29 (×3): qty 5

## 2016-07-29 MED ORDER — POLYETHYLENE GLYCOL 3350 17 G PO PACK
17.0000 g | PACK | Freq: Every day | ORAL | Status: DC
  Administered 2016-07-30 – 2016-08-02 (×4): 17 g via ORAL
  Filled 2016-07-29 (×4): qty 1

## 2016-07-29 MED ORDER — HYDRALAZINE HCL 20 MG/ML IJ SOLN: 10.0000 mg | INTRAMUSCULAR | Status: DC | PRN

## 2016-07-29 MED ORDER — CITALOPRAM HYDROBROMIDE 20 MG PO TABS
20.0000 mg | ORAL_TABLET | Freq: Every day | ORAL | Status: DC
  Administered 2016-07-30 – 2016-08-02 (×4): 20 mg via ORAL
  Filled 2016-07-29 (×3): qty 2
  Filled 2016-07-29: qty 1
  Filled 2016-07-29: qty 2
  Filled 2016-07-29 (×3): qty 1

## 2016-07-29 MED ORDER — TOPIRAMATE 25 MG PO TABS
50.0000 mg | ORAL_TABLET | Freq: Two times a day (BID) | ORAL | Status: DC
  Administered 2016-07-30 – 2016-07-31 (×3): 50 mg via ORAL
  Filled 2016-07-29 (×3): qty 2

## 2016-07-29 MED ORDER — VITAMIN D 1000 UNITS PO TABS
1000.0000 [IU] | ORAL_TABLET | Freq: Every day | ORAL | Status: DC
  Administered 2016-07-30 – 2016-08-02 (×4): 1000 [IU] via ORAL
  Filled 2016-07-29 (×4): qty 1

## 2016-07-29 MED ORDER — SENNA 8.6 MG PO TABS: 1.0000 | ORAL_TABLET | Freq: Every day | ORAL | Status: DC | PRN

## 2016-07-29 MED ORDER — LISINOPRIL 10 MG PO TABS
40.0000 mg | ORAL_TABLET | Freq: Every day | ORAL | Status: DC
Start: 2016-07-30 — End: 2016-08-02
  Administered 2016-07-30 – 2016-08-02 (×4): 40 mg via ORAL
  Filled 2016-07-29 (×4): qty 4

## 2016-07-29 NOTE — ED Notes (Signed)
Paged code stroke at 14:44; called SOC at 14:45

## 2016-07-29 NOTE — ED Notes (Signed)
Daughter Laure Kidney  Home [phone] Cell     [phone]

## 2016-07-29 NOTE — ED Notes (Signed)
Report given to [facility] at [facility], ETA they will be leaving [facility] in about 30 mins

## 2016-07-29 NOTE — ED Triage Notes (Addendum)
Pt resident of ALF. Per facility was at lunch table and noted to be trembling in right hand. Pt would not speak to staff.Pt would not raise head. Per staff right arm "drawing" up. Noted to have right sided mouth droop.  CBG 131. Per EMS she spoke to them. Grip strength equal and strong. Daughter states she least saw pt Thursday and wasn't speaking as much and holding head down

## 2016-07-29 NOTE — ED Notes (Signed)
Report given to [facility] on 18M at [facility]. All questions answered.

## 2016-07-29 NOTE — ED Notes (Signed)
Pt expressionless. Noted to have droop to right lower mouth. Not answering nurses question but able to follow commands such as grip which is equal and strong.

## 2016-07-29 NOTE — ED Notes (Signed)
CRITICAL VALUE ALERT  Critical value received:  Potassium 2.6  Date of notification:  07/29/16  Time of notification:  1541  Critical value read back:Yes.    Nurse who received alert:  Esperanza Richters, RN  MD notified (1st page):  Dr. Clarene Duke  Time of first page:  1541  MD notified (2nd page):  Time of second page:  Responding MD:  Dr. Clarene Duke  Time MD responded:  [phone]

## 2016-07-29 NOTE — ED Provider Notes (Signed)
[facility]-EMERGENCY DEPT Provider Note   CSN: [id number] Arrival date & time: 07/29/16  1420  First Provider Contact:  None       History   Chief Complaint Chief Complaint  Patient presents with  . Altered Mental Status    HPI [name] is a 81 y.o. female.  The history is provided by the nursing home and the EMS personnel. The history is limited by the condition of the patient (Hx dementia).  Altered Mental Status    Pt was seen at 1425. Per EMS and NH report: Pt LKW approximately 1200 today while sitting at lunch. Pt suddenly tipped her head forward, "right arm shook," she "drew up her right side" and "wouldn't talk to [facility] staff." Staff also noted pt's right face was "drooping." Pt herself has significant hx of dementia.     Past Medical History:  Diagnosis Date  . Alzheimer disease    per facility paperwork dated 05/04/2016  . Anxiety   . Dementia   . Diabetes mellitus   . Hypercholesteremia   . Hypertension   . Thyroid disease     Patient Active Problem List   Diagnosis Date Noted  . Displaced fracture of right femoral neck (HCC) 05/07/2016  . Hip fracture (HCC) 05/04/2016  . Closed right hip fracture (HCC) 05/04/2016  . Type 2 diabetes mellitus with hyperglycemia (HCC) 05/04/2016  . Dementia 05/04/2016  . Closed fracture of part of upper end of humerus 01/03/2015  . Hypothyroidism 12/19/2010  . DIABETES MELLITUS, TYPE II 12/19/2010  . DEMENTIA 12/19/2010  . ANXIETY 12/19/2010  . Essential hypertension 12/19/2010  . DIZZINESS 12/19/2010  . FATIGUE 12/19/2010  . HEADACHE 12/19/2010    Past Surgical History:  Procedure Laterality Date  . ABDOMINAL HYSTERECTOMY    . ANTERIOR APPROACH HEMI HIP ARTHROPLASTY Right 05/07/2016   Procedure: ANTERIOR APPROACH HEMI HIP ARTHROPLASTY VERSUS TOTAL HIP ARTHROPLASTY;  Surgeon: Samson Frederic, MD;  Location: [facility] OR;  Service: Orthopedics;  Laterality: Right;  . CHOLECYSTECTOMY         Home Medications    Prior  to Admission medications   Medication Sig Start Date End Date Taking? Authorizing Provider  acetaminophen (TYLENOL) 325 MG tablet Take 650 mg by mouth 3 (three) times daily.    Historical Provider, MD  amLODipine (NORVASC) 5 MG tablet Take 2 tablets (10 mg total) by mouth daily. 05/09/16   Shanker Levora Dredge, MD  Artificial Tear Solution (SOOTHE XP OP) Apply 1 drop to eye 2 (two) times daily.    Historical Provider, MD  cholecalciferol (VITAMIN D) 1000 UNITS tablet Take 1,000 Units by mouth daily.    Historical Provider, MD  citalopram (CELEXA) 20 MG tablet Take 20 mg by mouth daily.    Historical Provider, MD  donepezil (ARICEPT) 10 MG tablet Take 10 mg by mouth at bedtime.    Historical Provider, MD  enoxaparin (LOVENOX) 40 MG/0.4ML injection Inject 0.4 mLs (40 mg total) into the skin daily. For 30 days from 05/07/16 05/09/16   Maretta Bees, MD  feeding supplement, ENSURE ENLIVE, (ENSURE ENLIVE) LIQD Take 237 mLs by mouth 2 (two) times daily between meals. 05/09/16   Shanker Levora Dredge, MD  ibuprofen (ADVIL,MOTRIN) 800 MG tablet Take 1 tablet (800 mg total) by mouth every 8 (eight) hours as needed. 05/09/16   Shanker Levora Dredge, MD  levothyroxine (SYNTHROID, LEVOTHROID) 25 MCG tablet Take 25 mcg by mouth daily before breakfast.    Historical Provider, MD  lisinopril (PRINIVIL,ZESTRIL) 40 MG  tablet Take 40 mg by mouth daily.    Historical Provider, MD  loperamide (IMODIUM A-D) 2 MG tablet Take 2 mg by mouth 4 (four) times daily as needed for diarrhea or loose stools.    Historical Provider, MD  magnesium oxide (MAG-OX) 400 MG tablet Take 400 mg by mouth 2 (two) times daily.    Historical Provider, MD  Memantine HCl ER (NAMENDA XR) 28 MG CP24 Take 28 mg by mouth daily.    Historical Provider, MD  metFORMIN (GLUCOPHAGE) 500 MG tablet Take 250 mg by mouth 2 (two) times daily with a meal.    Historical Provider, MD  metoprolol tartrate (LOPRESSOR) 25 MG tablet Take 25 mg by mouth 2 (two) times daily.     Historical Provider, MD  pantoprazole (PROTONIX) 40 MG tablet Take 1 tablet (40 mg total) by mouth daily. 05/09/16   Shanker Levora Dredge, MD  polyethylene glycol (MIRALAX / GLYCOLAX) packet Take 17 g by mouth daily. 05/09/16   Shanker Levora Dredge, MD  pravastatin (PRAVACHOL) 40 MG tablet Take 40 mg by mouth daily.    Historical Provider, MD  senna (SENOKOT) 8.6 MG TABS tablet Take 1 tablet (8.6 mg total) by mouth daily as needed for mild constipation. 05/09/16   Shanker Levora Dredge, MD  topiramate (TOPAMAX) 50 MG tablet Take 50 mg by mouth 2 (two) times daily.    Historical Provider, MD  traMADol (ULTRAM) 50 MG tablet Take 1 tablet (50 mg total) by mouth every 6 (six) hours as needed for moderate pain. 05/09/16   Shanker Levora Dredge, MD    Family History Family History  Problem Relation Age of Onset  . Hypertension Other     Social History Social History  Substance Use Topics  . Smoking status: Former Games developer  . Smokeless tobacco: Former Neurosurgeon  . Alcohol use No     Allergies   Penicillins; Promethazine hcl; and Sulfonamide derivatives   Review of Systems Review of Systems  Unable to perform ROS: Dementia     Physical Exam Updated Vital Signs BP 139/71 (BP Location: Right Arm)   Pulse 62   Resp 16   Wt 134 lb (60.8 kg)   SpO2 98%   BMI 26.17 kg/m   Physical Exam  1425 Physical examination:  Nursing notes reviewed; Vital signs and O2 SAT reviewed;  Constitutional: Well developed, Well nourished, In no acute distress; Head:  Normocephalic, atraumatic; Eyes: EOMI, PERRL, No scleral icterus; ENMT: Mouth and pharynx normal, Mucous membranes dry; Neck: Supple, Full range of motion, No lymphadenopathy; Cardiovascular: Regular rate and rhythm, No gallop; Respiratory: Breath sounds clear & equal bilaterally, No wheezes.  Normal respiratory effort/excursion; Chest: Nontender, Movement normal; Abdomen: Soft, Nontender, Nondistended, Normal bowel sounds; Genitourinary: No CVA tenderness;  Extremities: Pulses normal, No tenderness, No edema, No calf edema or asymmetry.; Neuro: Awake, alert, non-verbal. +right lower facial droop. Grips equal. Will move all extremities on stretcher spontaneously and to command with significant encouragement.; Skin: Color normal, Warm, Dry.    ED Treatments / Results  Labs (all labs ordered are listed, but only abnormal results are displayed)   EKG  EKG Interpretation  Date/Time:  Sunday July 29 2016 14:48:57 EDT Ventricular Rate:  62 PR Interval:    QRS Duration: 148 QT Interval:  534 QTC Calculation: 543 R Axis:   21 Text Interpretation:  Sinus rhythm Right bundle branch block Baseline wander When compared with ECG of 05/04/2016 Right bundle branch block is now Present Confirmed by [facility]  MD, Nicholos Johns [phone]) on 07/29/2016 3:05:39 PM       Radiology   Procedures Procedures (including critical care time)  Medications Ordered in ED Medications - No data to display   Initial Impression / Assessment and Plan / ED Course  I have reviewed the triage vital signs and the nursing notes.  Pertinent labs & imaging results that were available during my care of the patient were reviewed by me and considered in my medical decision making (see chart for details).  MDM Reviewed: previous chart, nursing note and vitals Reviewed previous: labs and ECG Interpretation: labs, ECG, x-ray and CT scan    Results for orders placed or performed during the hospital encounter of 07/29/16  Ethanol  Result Value Ref Range   Alcohol, Ethyl (B) <5 <5 mg/dL  Protime-INR  Result Value Ref Range   Prothrombin Time 13.9 11.4 - 15.2 seconds   INR 1.06   APTT  Result Value Ref Range   aPTT 32 24 - 36 seconds  CBC  Result Value Ref Range   WBC 7.4 4.0 - 10.5 K/uL   RBC 4.38 3.87 - 5.11 MIL/uL   Hemoglobin 12.3 12.0 - 15.0 g/dL   HCT 60.4 54.0 - 98.1 %   MCV 86.8 78.0 - 100.0 fL   MCH 28.1 26.0 - 34.0 pg   MCHC 32.4 30.0 - 36.0 g/dL   RDW 19.1  47.8 - 29.5 %   Platelets 167 150 - 400 K/uL  Differential  Result Value Ref Range   Neutrophils Relative % 56 %   Neutro Abs 4.3 1.7 - 7.7 K/uL   Lymphocytes Relative 30 %   Lymphs Abs 2.2 0.7 - 4.0 K/uL   Monocytes Relative 11 %   Monocytes Absolute 0.8 0.1 - 1.0 K/uL   Eosinophils Relative 2 %   Eosinophils Absolute 0.1 0.0 - 0.7 K/uL   Basophils Relative 1 %   Basophils Absolute 0.1 0.0 - 0.1 K/uL  Comprehensive metabolic panel  Result Value Ref Range   Sodium 144 135 - 145 mmol/L   Potassium 2.6 (LL) 3.5 - 5.1 mmol/L   Chloride 111 101 - 111 mmol/L   CO2 26 22 - 32 mmol/L   Glucose, Bld 121 (H) 65 - 99 mg/dL   BUN 12 6 - 20 mg/dL   Creatinine, Ser 6.21 0.44 - 1.00 mg/dL   Calcium 9.8 8.9 - 30.8 mg/dL   Total Protein 7.3 6.5 - 8.1 g/dL   Albumin 3.7 3.5 - 5.0 g/dL   AST 17 15 - 41 U/L   ALT 13 (L) 14 - 54 U/L   Alkaline Phosphatase 88 38 - 126 U/L   Total Bilirubin 0.3 0.3 - 1.2 mg/dL   GFR calc non Af Amer >60 >60 mL/min   GFR calc Af Amer >60 >60 mL/min   Anion gap 7 5 - 15  Urine rapid drug screen (hosp performed)not at [facility]  Result Value Ref Range   Opiates NONE DETECTED NONE DETECTED   Cocaine NONE DETECTED NONE DETECTED   Benzodiazepines NONE DETECTED NONE DETECTED   Amphetamines NONE DETECTED NONE DETECTED   Tetrahydrocannabinol NONE DETECTED NONE DETECTED   Barbiturates NONE DETECTED NONE DETECTED  Urinalysis, Routine w reflex microscopic (not at [facility])  Result Value Ref Range   Color, Urine YELLOW YELLOW   APPearance CLEAR CLEAR   Specific Gravity, Urine 1.020 1.005 - 1.030   pH 7.0 5.0 - 8.0   Glucose, UA NEGATIVE NEGATIVE mg/dL   Hgb urine  dipstick NEGATIVE NEGATIVE   Bilirubin Urine NEGATIVE NEGATIVE   Ketones, ur NEGATIVE NEGATIVE mg/dL   Protein, ur NEGATIVE NEGATIVE mg/dL   Nitrite NEGATIVE NEGATIVE   Leukocytes, UA NEGATIVE NEGATIVE  I-Stat Chem 8, ED  (not at [facility])  Result Value Ref Range   Sodium 149 (H) 135 - 145 mmol/L   Potassium  2.6 (LL) 3.5 - 5.1 mmol/L   Chloride 108 101 - 111 mmol/L   BUN 10 6 - 20 mg/dL   Creatinine, Ser 1.61 0.44 - 1.00 mg/dL   Glucose, Bld 096 (H) 65 - 99 mg/dL   Calcium, Ion 0.45 (H) 1.12 - 1.23 mmol/L   TCO2 25 0 - 100 mmol/L   Hemoglobin 13.3 12.0 - 15.0 g/dL   HCT 40.9 81.1 - 91.4 %   Comment NOTIFIED PHYSICIAN   I-stat troponin, ED (not at [facility])  Result Value Ref Range   Troponin i, poc 0.00 0.00 - 0.08 ng/mL   Comment 3          CBG monitoring, ED  Result Value Ref Range   Glucose-Capillary 115 (H) 65 - 99 mg/dL   Ct Angio Head W Or Wo Contrast Result Date: 07/29/2016 CLINICAL DATA:  Right hand trembling. Right-sided mouth droop. Alzheimer's disease. EXAM: CT ANGIOGRAPHY HEAD AND NECK TECHNIQUE: Multidetector CT imaging of the head and neck was performed using the standard protocol during bolus administration of intravenous contrast. Multiplanar CT image reconstructions and MIPs were obtained to evaluate the vascular anatomy. Carotid stenosis measurements (when applicable) are obtained utilizing NASCET criteria, using the distal internal carotid diameter as the denominator. CONTRAST:  75 cc Isovue 370 intravenous COMPARISON:  Noncontrast head CT from earlier today. FINDINGS: CTA NECK Aortic arch: 4 vessel branching pattern. Diffuse atherosclerotic calcification. Right carotid system: Scattered predominately calcified atherosclerotic plaque. No flow limiting stenosis or dissection. No plaque ulceration. Occasional artifact. Left carotid system: Intermittent artifact. Scattered atheromatous calcification, greatest at the bifurcation. Stenosis measures up to 25% at the proximal ICA. No evidence of dissection. Shallow excavation of plaque along the lateral proximal ICA. Vertebral arteries:No proximal subclavian stenosis on the right. Right vertebral artery dominance, sole contributor to the basilar. No flow limiting stenosis. No evidence of dissection when allowing for levels of artifact.  Skeleton: No acute finding. Other neck: No incidental mass or adenopathy in the neck. CTA HEAD Anterior circulation: Scattered atheromatous calcifications greatest at the bilateral siphons. No major branch occlusion or flow limiting stenosis. Negative for aneurysm. Posterior circulation: Strongly dominant right vertebral artery. Fetal type right PCA. No major branch occlusion or flow limiting stenosis. Venous sinuses: Patent Anatomic variants: Fetal type PCA on the right. Left posterior communicating artery infundibulum. Delayed phase: Negative for mass or other abnormal enhancement. Atrophy and chronic microvascular disease as previously described. IMPRESSION: 1. No major vessel occlusion or flow limiting stenosis. 2. Proximal left ICA stenosis measuring ~25% with lateral wall plaque ulceration. Electronically Signed   By: Marnee Spring M.D.   On: 07/29/2016 18:08  Ct Head Wo Contrast Result Date: 07/29/2016 CLINICAL DATA:  Right facial droop today.  Code stroke EXAM: CT HEAD WITHOUT CONTRAST TECHNIQUE: Contiguous axial images were obtained from the base of the skull through the vertex without intravenous contrast. COMPARISON:  04/24/2016 FINDINGS: There is atrophy and chronic small vessel disease changes. No acute intracranial abnormality. Specifically, no hemorrhage, hydrocephalus, mass lesion, acute infarction, or significant intracranial injury. No acute calvarial abnormality. Visualized paranasal sinuses and mastoids clear. Orbital soft tissues unremarkable. IMPRESSION:  No acute intracranial abnormality. Atrophy, chronic microvascular disease. Critical Value/emergent results were called by telephone at the time of interpretation on 07/29/2016 at 3:05 pm to Dr. Hyacinth Meeker , who verbally acknowledged these results. Electronically Signed   By: Charlett Nose M.D.   On: 07/29/2016 15:06  Ct Angio Neck W And/or Wo Contrast Result Date: 07/29/2016 CLINICAL DATA:  Right hand trembling. Right-sided mouth droop.  Alzheimer's disease. EXAM: CT ANGIOGRAPHY HEAD AND NECK TECHNIQUE: Multidetector CT imaging of the head and neck was performed using the standard protocol during bolus administration of intravenous contrast. Multiplanar CT image reconstructions and MIPs were obtained to evaluate the vascular anatomy. Carotid stenosis measurements (when applicable) are obtained utilizing NASCET criteria, using the distal internal carotid diameter as the denominator. CONTRAST:  75 cc Isovue 370 intravenous COMPARISON:  Noncontrast head CT from earlier today. FINDINGS: CTA NECK Aortic arch: 4 vessel branching pattern. Diffuse atherosclerotic calcification. Right carotid system: Scattered predominately calcified atherosclerotic plaque. No flow limiting stenosis or dissection. No plaque ulceration. Occasional artifact. Left carotid system: Intermittent artifact. Scattered atheromatous calcification, greatest at the bifurcation. Stenosis measures up to 25% at the proximal ICA. No evidence of dissection. Shallow excavation of plaque along the lateral proximal ICA. Vertebral arteries:No proximal subclavian stenosis on the right. Right vertebral artery dominance, sole contributor to the basilar. No flow limiting stenosis. No evidence of dissection when allowing for levels of artifact. Skeleton: No acute finding. Other neck: No incidental mass or adenopathy in the neck. CTA HEAD Anterior circulation: Scattered atheromatous calcifications greatest at the bilateral siphons. No major branch occlusion or flow limiting stenosis. Negative for aneurysm. Posterior circulation: Strongly dominant right vertebral artery. Fetal type right PCA. No major branch occlusion or flow limiting stenosis. Venous sinuses: Patent Anatomic variants: Fetal type PCA on the right. Left posterior communicating artery infundibulum. Delayed phase: Negative for mass or other abnormal enhancement. Atrophy and chronic microvascular disease as previously described. IMPRESSION:  1. No major vessel occlusion or flow limiting stenosis. 2. Proximal left ICA stenosis measuring ~25% with lateral wall plaque ulceration. Electronically Signed   By: Marnee Spring M.D.   On: 07/29/2016 18:08  Dg Chest Port 1 View Result Date: 07/29/2016 CLINICAL DATA:  Altered mental status. EXAM: PORTABLE CHEST 1 VIEW COMPARISON:  05/04/2016 FINDINGS: Low lung volumes with bibasilar atelectasis. Heart is normal size. No effusions. No acute bony abnormality. IMPRESSION: Low lung volumes with bibasilar atelectasis. Electronically Signed   By: Charlett Nose M.D.   On: 07/29/2016 15:45   1530:  Code stroke called on arrival. T/C from [facility] after evaluation, case discussed, including:  HPI, pertinent PM/SHx, VS/PE, dx testing, ED course and treatment:  Pt is less likely stroke than seizure with prolonged post-ictal state, start IV keppra 1000mg  now, obtain CT-A head and neck now, pt will need EEG today (prefer continuous EEG), states transfer if unable to obtain here at this institution today.   1900:  IV potassium runs ordered. Pt failed swallow screen. No change in pt assessment. T/C to [facility]. Antionette Char, case discussed, including:  HPI, pertinent PM/SHx, VS/PE, dx testing, ED course and treatment:  Agreeable to facilitate transfer/admit to [facility], requests to write temporary orders, obtain tele bed to team [facility].  2205:  I-stat chem repeated after IV potassium:  K 3.1    Final Clinical Impressions(s) / ED Diagnoses   Final diagnoses:  None    New Prescriptions New Prescriptions   No medications on file     Samuel Jester,  DO 08/01/16 1310

## 2016-07-29 NOTE — H&P (Signed)
History and Physical    [name] ZOX:[id number] DOB: April 26, 1935 DOA: 07/29/2016  PCP: Galvin Proffer, MD   Patient coming from: Nursing home   Chief Complaint: Altered mental status  HPI: [name] is a 81 y.o. female with medical history significant for advanced dementia, hypertension, type 2 diabetes mellitus, hypothyroidism, and anxiety who presents the emergency department from her nursing facility for evaluation of altered mental status. Patient was reportedly in her usual state in until approximately noon on the day of her admission when she was noted to develop acute onset of right arm shaking, followed by "drawing up of her right side," and lack of verbal responsiveness. Patient has advanced dementia, but reportedly provides verbal responses at her baseline. She would not speak to staff following this episode at the nursing facility and she was transported to the emergency department for further evaluation. She has not exhibited similar behavior previously, has not voiced any recent complaints, and there is been no recent change in her medications.  ED Course: Upon arrival to the ED, patient is found to be afebrile, saturating well on room air, hypertensive to the 190/120 range, and with vitals otherwise stable. EKG demonstrates a sinus rhythm with right bundle branch block and chest x-rays notable for low volumes with bibasilar atelectasis. CMP features a serum potassium of 2.6. CBC is unremarkable, INR is normal, troponin is undetectable, UDS is negative, and urinalysis is unremarkable. Noncontrast head CT was obtained and negative for acute intracranial abnormality. A code stroke was activated prior to the patient's arrival and neurology evaluated the patient in the ED. It was suspected that the patient's presentation was secondary to seizure rather than CVA and transfer to [facility] in order to obtain a continuous EEG was advised by the neurologist. She was loaded with  1 g of Keppra intravenously and potassium was supplemented with 20 mEq IV. Patient remained hemodynamically stable in the ED and will be observed on the telemetry unit at [facility] for further evaluation and management of altered mental status suspected secondary to seizure, with acute CVA less likely.  Review of Systems:  Unable to obtain secondary to the patient's clinical condition with advanced dementia  Past Medical History:  Diagnosis Date  . Alzheimer disease    per facility paperwork dated 05/04/2016  . Anxiety   . Dementia   . Diabetes mellitus   . Hypercholesteremia   . Hypertension   . Intracranial bleeding (HCC)    10 years ago  . Seizures (HCC)    approx 10 years when she had brain bleed  . Thyroid disease     Past Surgical History:  Procedure Laterality Date  . ABDOMINAL HYSTERECTOMY    . ANTERIOR APPROACH HEMI HIP ARTHROPLASTY Right 05/07/2016   Procedure: ANTERIOR APPROACH HEMI HIP ARTHROPLASTY VERSUS TOTAL HIP ARTHROPLASTY;  Surgeon: Samson Frederic, MD;  Location: [facility] OR;  Service: Orthopedics;  Laterality: Right;  . CHOLECYSTECTOMY       reports that she has quit smoking. She has quit using smokeless tobacco. She reports that she does not drink alcohol or use drugs.  Allergies  Allergen Reactions  . Penicillins Nausea And Vomiting    Has patient had a PCN reaction causing immediate rash, facial/tongue/throat swelling, SOB or lightheadedness with hypotension: {unknown Has patient had a PCN reaction causing severe rash involving mucus membranes or skin necrosis: unknown Has patient had a PCN reaction that required hospitalization: unknown Has patient had a PCN reaction occurring within the  last 10 years: unknown If all of the above answers are "NO", then may proceed with Cephalosporin use.   . Promethazine Hcl Nausea And Vomiting  . Sulfonamide Derivatives Other (See Comments)    unknown    Family History  Problem Relation Age of Onset  . Hypertension  Other      Prior to Admission medications   Medication Sig Start Date End Date Taking? Authorizing Provider  acetaminophen (TYLENOL) 500 MG tablet Take 1,000 mg by mouth every 6 (six) hours as needed for headache.   Yes Historical Provider, MD  amLODipine (NORVASC) 5 MG tablet Take 2 tablets (10 mg total) by mouth daily. 05/09/16  Yes Shanker Levora Dredge, MD  Artificial Tear Solution (SOOTHE XP OP) Apply 1 drop to eye 2 (two) times daily.   Yes Historical Provider, MD  cholecalciferol (VITAMIN D) 1000 UNITS tablet Take 1,000 Units by mouth daily.   Yes Historical Provider, MD  citalopram (CELEXA) 20 MG tablet Take 20 mg by mouth daily.   Yes Historical Provider, MD  levothyroxine (SYNTHROID, LEVOTHROID) 25 MCG tablet Take 25 mcg by mouth daily before breakfast.   Yes Historical Provider, MD  lisinopril (PRINIVIL,ZESTRIL) 40 MG tablet Take 40 mg by mouth daily.   Yes Historical Provider, MD  loperamide (IMODIUM A-D) 2 MG tablet Take 2 mg by mouth 4 (four) times daily as needed for diarrhea or loose stools.   Yes Historical Provider, MD  magnesium oxide (MAG-OX) 400 MG tablet Take 400 mg by mouth 2 (two) times daily.   Yes Historical Provider, MD  Memantine HCl-Donepezil HCl (NAMZARIC) 28-10 MG CP24 Take 1 tablet by mouth daily.   Yes Historical Provider, MD  metFORMIN (GLUCOPHAGE) 500 MG tablet Take 250 mg by mouth 2 (two) times daily with a meal.   Yes Historical Provider, MD  metoprolol tartrate (LOPRESSOR) 25 MG tablet Take 25 mg by mouth 2 (two) times daily.   Yes Historical Provider, MD  pantoprazole (PROTONIX) 40 MG tablet Take 1 tablet (40 mg total) by mouth daily. 05/09/16  Yes Shanker Levora Dredge, MD  polyethylene glycol (MIRALAX / GLYCOLAX) packet Take 17 g by mouth daily. 05/09/16  Yes Shanker Levora Dredge, MD  pravastatin (PRAVACHOL) 40 MG tablet Take 40 mg by mouth daily.   Yes Historical Provider, MD  senna (SENOKOT) 8.6 MG TABS tablet Take 1 tablet (8.6 mg total) by mouth daily as needed for  mild constipation. 05/09/16  Yes Shanker Levora Dredge, MD  topiramate (TOPAMAX) 50 MG tablet Take 50 mg by mouth 2 (two) times daily.   Yes Historical Provider, MD  traMADol (ULTRAM) 50 MG tablet Take 1 tablet (50 mg total) by mouth every 6 (six) hours as needed for moderate pain. Patient taking differently: Take 50 mg by mouth every 6 (six) hours as needed.  05/09/16  Yes Shanker Levora Dredge, MD  traMADol (ULTRAM) 50 MG tablet Take 50 mg by mouth 3 (three) times daily.   Yes Historical Provider, MD  feeding supplement, ENSURE ENLIVE, (ENSURE ENLIVE) LIQD Take 237 mLs by mouth 2 (two) times daily between meals. Patient not taking: Reported on 07/29/2016 05/09/16   Maretta Bees, MD  ibuprofen (ADVIL,MOTRIN) 800 MG tablet Take 1 tablet (800 mg total) by mouth every 8 (eight) hours as needed. Patient not taking: Reported on 07/29/2016 05/09/16   Maretta Bees, MD    Physical Exam: Vitals:   07/29/16 2030 07/29/16 2100 07/29/16 2130 07/29/16 2200  BP: (!) 117/50 (!) 117/49 103/56 [name])  128/54  Pulse:      Resp: 16 12 19  (!) 9  Temp:      TempSrc:      SpO2:      Weight:          Constitutional: NAD, calm, comfortable Eyes: PERTLA, lids and conjunctivae normal ENMT: Mucous membranes are moist. Posterior pharynx clear of any exudate or lesions.   Neck: normal, supple, no masses, no thyromegaly Respiratory: clear to auscultation bilaterally, no wheezing, no crackles. Normal respiratory effort.    Cardiovascular: S1 & S2 heard, regular rate and rhythm. No extremity edema. 2+ pedal pulses. No significant JVD. Abdomen: No distension, no tenderness, no masses palpated. Bowel sounds normal.  Musculoskeletal: no clubbing / cyanosis. No joint deformity upper and lower extremities. Normal muscle tone.  Skin: no significant rashes, lesions, ulcers. Warm, dry, well-perfused. Neurologic: CN 2-12 grossly intact. Sensation intact, patellar DTR normal. Strength 5/5 in all 4 limbs.  Psychiatric: Alert,  follows commands. Appropriate verbal responses intermittently. Normal mood and affect.     Labs on Admission: I have personally reviewed following labs and imaging studies  CBC:  Recent Labs Lab 07/29/16 1449 07/29/16 1454 07/29/16 2202  WBC 7.4  --   --   NEUTROABS 4.3  --   --   HGB 12.3 13.3 11.9*  HCT 38.0 39.0 35.0*  MCV 86.8  --   --   PLT 167  --   --    Basic Metabolic Panel:  Recent Labs Lab 07/29/16 1449 07/29/16 1454 07/29/16 2202  NA 144 149* 149*  K 2.6* 2.6* 3.1*  CL 111 108 112*  CO2 26  --   --   GLUCOSE 121* 116* 111*  BUN 12 10 6   CREATININE 0.82 0.80 0.80  CALCIUM 9.8  --   --    GFR: Estimated Creatinine Clearance: 47.2 mL/min (by C-G formula based on SCr of 0.8 mg/dL). Liver Function Tests:  Recent Labs Lab 07/29/16 1449  AST 17  ALT 13*  ALKPHOS 88  BILITOT 0.3  PROT 7.3  ALBUMIN 3.7   No results for input(s): LIPASE, AMYLASE in the last 168 hours. No results for input(s): AMMONIA in the last 168 hours. Coagulation Profile:  Recent Labs Lab 07/29/16 1449  INR 1.06   Cardiac Enzymes: No results for input(s): CKTOTAL, CKMB, CKMBINDEX, TROPONINI in the last 168 hours. BNP (last 3 results) No results for input(s): PROBNP in the last 8760 hours. HbA1C: No results for input(s): HGBA1C in the last 72 hours. CBG:  Recent Labs Lab 07/29/16 1449  GLUCAP 115*   Lipid Profile: No results for input(s): CHOL, HDL, LDLCALC, TRIG, CHOLHDL, LDLDIRECT in the last 72 hours. Thyroid Function Tests: No results for input(s): TSH, T4TOTAL, FREET4, T3FREE, THYROIDAB in the last 72 hours. Anemia Panel: No results for input(s): VITAMINB12, FOLATE, FERRITIN, TIBC, IRON, RETICCTPCT in the last 72 hours. Urine analysis:    Component Value Date/Time   COLORURINE YELLOW 07/29/2016 1511   APPEARANCEUR CLEAR 07/29/2016 1511   LABSPEC 1.020 07/29/2016 1511   PHURINE 7.0 07/29/2016 1511   GLUCOSEU NEGATIVE 07/29/2016 1511   HGBUR NEGATIVE  07/29/2016 1511   HGBUR negative 12/19/2010 1223   BILIRUBINUR NEGATIVE 07/29/2016 1511   KETONESUR NEGATIVE 07/29/2016 1511   PROTEINUR NEGATIVE 07/29/2016 1511   UROBILINOGEN 0.2 06/14/2015 2048   NITRITE NEGATIVE 07/29/2016 1511   LEUKOCYTESUR NEGATIVE 07/29/2016 1511   Sepsis Labs: @LABRCNTIP (procalcitonin:4,lacticidven:4) )No results found for this or any previous visit (from the past 240  hour(s)).   Radiological Exams on Admission: Ct Angio Head W Or Wo Contrast  Result Date: 07/29/2016 CLINICAL DATA:  Right hand trembling. Right-sided mouth droop. Alzheimer's disease. EXAM: CT ANGIOGRAPHY HEAD AND NECK TECHNIQUE: Multidetector CT imaging of the head and neck was performed using the standard protocol during bolus administration of intravenous contrast. Multiplanar CT image reconstructions and MIPs were obtained to evaluate the vascular anatomy. Carotid stenosis measurements (when applicable) are obtained utilizing NASCET criteria, using the distal internal carotid diameter as the denominator. CONTRAST:  75 cc Isovue 370 intravenous COMPARISON:  Noncontrast head CT from earlier today. FINDINGS: CTA NECK Aortic arch: 4 vessel branching pattern. Diffuse atherosclerotic calcification. Right carotid system: Scattered predominately calcified atherosclerotic plaque. No flow limiting stenosis or dissection. No plaque ulceration. Occasional artifact. Left carotid system: Intermittent artifact. Scattered atheromatous calcification, greatest at the bifurcation. Stenosis measures up to 25% at the proximal ICA. No evidence of dissection. Shallow excavation of plaque along the lateral proximal ICA. Vertebral arteries:No proximal subclavian stenosis on the right. Right vertebral artery dominance, sole contributor to the basilar. No flow limiting stenosis. No evidence of dissection when allowing for levels of artifact. Skeleton: No acute finding. Other neck: No incidental mass or adenopathy in the neck. CTA  HEAD Anterior circulation: Scattered atheromatous calcifications greatest at the bilateral siphons. No major branch occlusion or flow limiting stenosis. Negative for aneurysm. Posterior circulation: Strongly dominant right vertebral artery. Fetal type right PCA. No major branch occlusion or flow limiting stenosis. Venous sinuses: Patent Anatomic variants: Fetal type PCA on the right. Left posterior communicating artery infundibulum. Delayed phase: Negative for mass or other abnormal enhancement. Atrophy and chronic microvascular disease as previously described. IMPRESSION: 1. No major vessel occlusion or flow limiting stenosis. 2. Proximal left ICA stenosis measuring ~25% with lateral wall plaque ulceration. Electronically Signed   By: Marnee Spring M.D.   On: 07/29/2016 18:08  Ct Head Wo Contrast  Result Date: 07/29/2016 CLINICAL DATA:  Right facial droop today.  Code stroke EXAM: CT HEAD WITHOUT CONTRAST TECHNIQUE: Contiguous axial images were obtained from the base of the skull through the vertex without intravenous contrast. COMPARISON:  04/24/2016 FINDINGS: There is atrophy and chronic small vessel disease changes. No acute intracranial abnormality. Specifically, no hemorrhage, hydrocephalus, mass lesion, acute infarction, or significant intracranial injury. No acute calvarial abnormality. Visualized paranasal sinuses and mastoids clear. Orbital soft tissues unremarkable. IMPRESSION: No acute intracranial abnormality. Atrophy, chronic microvascular disease. Critical Value/emergent results were called by telephone at the time of interpretation on 07/29/2016 at 3:05 pm to Dr. Hyacinth Meeker , who verbally acknowledged these results. Electronically Signed   By: Charlett Nose M.D.   On: 07/29/2016 15:06  Ct Angio Neck W And/or Wo Contrast  Result Date: 07/29/2016 CLINICAL DATA:  Right hand trembling. Right-sided mouth droop. Alzheimer's disease. EXAM: CT ANGIOGRAPHY HEAD AND NECK TECHNIQUE: Multidetector CT imaging  of the head and neck was performed using the standard protocol during bolus administration of intravenous contrast. Multiplanar CT image reconstructions and MIPs were obtained to evaluate the vascular anatomy. Carotid stenosis measurements (when applicable) are obtained utilizing NASCET criteria, using the distal internal carotid diameter as the denominator. CONTRAST:  75 cc Isovue 370 intravenous COMPARISON:  Noncontrast head CT from earlier today. FINDINGS: CTA NECK Aortic arch: 4 vessel branching pattern. Diffuse atherosclerotic calcification. Right carotid system: Scattered predominately calcified atherosclerotic plaque. No flow limiting stenosis or dissection. No plaque ulceration. Occasional artifact. Left carotid system: Intermittent artifact. Scattered atheromatous calcification, greatest at the bifurcation.  Stenosis measures up to 25% at the proximal ICA. No evidence of dissection. Shallow excavation of plaque along the lateral proximal ICA. Vertebral arteries:No proximal subclavian stenosis on the right. Right vertebral artery dominance, sole contributor to the basilar. No flow limiting stenosis. No evidence of dissection when allowing for levels of artifact. Skeleton: No acute finding. Other neck: No incidental mass or adenopathy in the neck. CTA HEAD Anterior circulation: Scattered atheromatous calcifications greatest at the bilateral siphons. No major branch occlusion or flow limiting stenosis. Negative for aneurysm. Posterior circulation: Strongly dominant right vertebral artery. Fetal type right PCA. No major branch occlusion or flow limiting stenosis. Venous sinuses: Patent Anatomic variants: Fetal type PCA on the right. Left posterior communicating artery infundibulum. Delayed phase: Negative for mass or other abnormal enhancement. Atrophy and chronic microvascular disease as previously described. IMPRESSION: 1. No major vessel occlusion or flow limiting stenosis. 2. Proximal left ICA stenosis  measuring ~25% with lateral wall plaque ulceration. Electronically Signed   By: Marnee Spring M.D.   On: 07/29/2016 18:08  Dg Chest Port 1 View  Result Date: 07/29/2016 CLINICAL DATA:  Altered mental status. EXAM: PORTABLE CHEST 1 VIEW COMPARISON:  05/04/2016 FINDINGS: Low lung volumes with bibasilar atelectasis. Heart is normal size. No effusions. No acute bony abnormality. IMPRESSION: Low lung volumes with bibasilar atelectasis. Electronically Signed   By: Charlett Nose M.D.   On: 07/29/2016 15:45   EKG: Independently reviewed. Sinus rhythm, RBBB  Assessment/Plan  1. Altered mental status  - Had an acute episode at her nursing facility at [facility] on 07/29/16 marked by RUE shaking, followed by transient non-verbal state - Code stroke was activated and neurology suspects seizure rather than CVA; Keppra load and transfer to [facility] for continuous EEG was advised  - Non-contrast head CT neg for acute intracranial abnormality; CTA head and neck with no major vessel occlusion or flow-limiting stenosis; prox left ICA with 25% stenosis and lateral wall ulceration  - No tPA as seizure rather than CVA suspected  - Frequent neuro checks, monitor on telemetry; continuous EEG - Loaded with 1g Keppra in ED, will continue with 500 BID - MRI/MRA brain to exclude CVA; check TTE; PT, OT, SLP evals; prophylactic ASA; check lipid panel and A1c   2. Hypokalemia  - Serum potassium 2.6 on admission  - 20 mEq IV potassium given in ED, will give another 20 mEq IV after repeat K+ has returned at 3.1  - Mag level pending, replete prn  - Repeat chem panel in am    3. Hypothyroidism  - Will check thyroid studies given her presentation with AMS  - Continue current-dose Synthroid for now   4. Dementia  - Alzheimer's per chart notes  - Continue memantine, donepezil  5. Hypertension  - Managed with Lopressor, Norvasc, lisinopril at home  - Currently NPO as could not pass bedside swallow screen; will use prn  hydralazine IVP's for now  - Permissive HTN considered, but as neurology favors seizure, will maintain lower threshold for tx    6. Type II DM  - A1c was 6.6% in 2011, at-goal  - Managed with metformin only at the nursing home, will hold while in hospital  - Check CBG q4h while NPO  - Low-intensity SSI correctional, adjust prn  - Update A1c, pending    DVT prophylaxis: sq Lovenox   Code Status: DNR  Family Communication: Discussed with patient Disposition Plan: Observe on telemetry at [facility]  Consults called: Neurology (code stroke) Admission  status: Observation    Briscoe Deutscher, MD [facility] Pager [phone]  If 7PM-7AM, please contact night-coverage [email or web address] Password [email or web address]  07/29/2016, 10:31 PM

## 2016-07-29 NOTE — ED Notes (Signed)
Called and spoke with Laure Kidney patients daughter, informed her of patients transfer to [facility]. Given room number and phone number to that unit.

## 2016-07-29 NOTE — ED Notes (Signed)
k  3.1. No further K replacement orders

## 2016-07-29 NOTE — ED Notes (Signed)
Potassium is 2.6 MD notified.

## 2016-07-29 NOTE — ED Notes (Signed)
Pt more alert at this time stating she is cold. Warm blankets given. Looking around room and answering simple questions

## 2016-07-30 ENCOUNTER — Observation Stay (HOSPITAL_COMMUNITY): Payer: Medicare Other

## 2016-07-30 ENCOUNTER — Observation Stay (HOSPITAL_BASED_OUTPATIENT_CLINIC_OR_DEPARTMENT_OTHER)
Admit: 2016-07-30 | Discharge: 2016-07-30 | Disposition: A | Discharge status: Home / Self Care | Payer: Medicare Other | Attending: Family Medicine | Admitting: Family Medicine

## 2016-07-30 DIAGNOSIS — R4182 Altered mental status, unspecified: Secondary | ICD-10-CM | POA: Diagnosis not present

## 2016-07-30 DIAGNOSIS — Z79899 Other long term (current) drug therapy: Secondary | ICD-10-CM | POA: Diagnosis not present

## 2016-07-30 DIAGNOSIS — Z96641 Presence of right artificial hip joint: Secondary | ICD-10-CM | POA: Diagnosis present

## 2016-07-30 DIAGNOSIS — Z880 Allergy status to penicillin: Secondary | ICD-10-CM | POA: Diagnosis not present

## 2016-07-30 DIAGNOSIS — F0390 Unspecified dementia without behavioral disturbance: Secondary | ICD-10-CM | POA: Diagnosis not present

## 2016-07-30 DIAGNOSIS — Z888 Allergy status to other drugs, medicaments and biological substances status: Secondary | ICD-10-CM | POA: Diagnosis not present

## 2016-07-30 DIAGNOSIS — E1165 Type 2 diabetes mellitus with hyperglycemia: Secondary | ICD-10-CM | POA: Diagnosis present

## 2016-07-30 DIAGNOSIS — I4510 Unspecified right bundle-branch block: Secondary | ICD-10-CM | POA: Diagnosis present

## 2016-07-30 DIAGNOSIS — Z882 Allergy status to sulfonamides status: Secondary | ICD-10-CM | POA: Diagnosis not present

## 2016-07-30 DIAGNOSIS — Z7984 Long term (current) use of oral hypoglycemic drugs: Secondary | ICD-10-CM | POA: Diagnosis not present

## 2016-07-30 DIAGNOSIS — Z7401 Bed confinement status: Secondary | ICD-10-CM | POA: Diagnosis not present

## 2016-07-30 DIAGNOSIS — E876 Hypokalemia: Secondary | ICD-10-CM | POA: Diagnosis present

## 2016-07-30 DIAGNOSIS — I10 Essential (primary) hypertension: Secondary | ICD-10-CM | POA: Diagnosis present

## 2016-07-30 DIAGNOSIS — M25551 Pain in right hip: Secondary | ICD-10-CM | POA: Diagnosis not present

## 2016-07-30 DIAGNOSIS — E038 Other specified hypothyroidism: Secondary | ICD-10-CM | POA: Diagnosis not present

## 2016-07-30 DIAGNOSIS — Z66 Do not resuscitate: Secondary | ICD-10-CM | POA: Diagnosis present

## 2016-07-30 DIAGNOSIS — E039 Hypothyroidism, unspecified: Secondary | ICD-10-CM | POA: Diagnosis present

## 2016-07-30 DIAGNOSIS — Z8249 Family history of ischemic heart disease and other diseases of the circulatory system: Secondary | ICD-10-CM | POA: Diagnosis not present

## 2016-07-30 DIAGNOSIS — E870 Hyperosmolality and hypernatremia: Secondary | ICD-10-CM | POA: Diagnosis present

## 2016-07-30 DIAGNOSIS — F0391 Unspecified dementia with behavioral disturbance: Secondary | ICD-10-CM | POA: Diagnosis not present

## 2016-07-30 DIAGNOSIS — Z87891 Personal history of nicotine dependence: Secondary | ICD-10-CM | POA: Diagnosis not present

## 2016-07-30 DIAGNOSIS — Z515 Encounter for palliative care: Secondary | ICD-10-CM | POA: Diagnosis not present

## 2016-07-30 DIAGNOSIS — G40209 Localization-related (focal) (partial) symptomatic epilepsy and epileptic syndromes with complex partial seizures, not intractable, without status epilepticus: Secondary | ICD-10-CM | POA: Diagnosis present

## 2016-07-30 DIAGNOSIS — F419 Anxiety disorder, unspecified: Secondary | ICD-10-CM | POA: Diagnosis present

## 2016-07-30 DIAGNOSIS — G309 Alzheimer's disease, unspecified: Secondary | ICD-10-CM | POA: Diagnosis present

## 2016-07-30 DIAGNOSIS — I6789 Other cerebrovascular disease: Secondary | ICD-10-CM | POA: Diagnosis not present

## 2016-07-30 DIAGNOSIS — R410 Disorientation, unspecified: Secondary | ICD-10-CM | POA: Diagnosis not present

## 2016-07-30 LAB — GLUCOSE, CAPILLARY
GLUCOSE-CAPILLARY: 102 mg/dL — AB (ref 65–99)
GLUCOSE-CAPILLARY: 110 mg/dL — AB (ref 65–99)
GLUCOSE-CAPILLARY: 116 mg/dL — AB (ref 65–99)
GLUCOSE-CAPILLARY: 174 mg/dL — AB (ref 65–99)
Glucose-Capillary: 106 mg/dL — ABNORMAL HIGH (ref 65–99)
Glucose-Capillary: 137 mg/dL — ABNORMAL HIGH (ref 65–99)

## 2016-07-30 LAB — T4, FREE: FREE T4: 1.21 ng/dL — AB (ref 0.61–1.12)

## 2016-07-30 LAB — MRSA PCR SCREENING: MRSA BY PCR: NEGATIVE

## 2016-07-30 MED ORDER — POTASSIUM CHLORIDE 10 MEQ/100ML IV SOLN
10.0000 meq | INTRAVENOUS | Status: AC
  Administered 2016-07-30 (×3): 10 meq via INTRAVENOUS
  Filled 2016-07-30 (×2): qty 100

## 2016-07-30 MED ORDER — PHENYTOIN 50 MG PO CHEW
200.0000 mg | CHEWABLE_TABLET | Freq: Once | ORAL | Status: AC
  Administered 2016-07-30: 200 mg via ORAL
  Filled 2016-07-30: qty 4

## 2016-07-30 MED ORDER — GABAPENTIN 100 MG PO CAPS
100.0000 mg | ORAL_CAPSULE | Freq: Every day | ORAL | Status: DC
  Administered 2016-07-30 – 2016-08-01 (×3): 100 mg via ORAL
  Filled 2016-07-30 (×3): qty 1

## 2016-07-30 MED ORDER — SODIUM CHLORIDE 0.45 % IV SOLN
INTRAVENOUS | Status: DC
  Administered 2016-07-30 – 2016-07-31 (×2): via INTRAVENOUS

## 2016-07-30 MED ORDER — POTASSIUM CHLORIDE 10 MEQ/100ML IV SOLN
10.0000 meq | INTRAVENOUS | Status: AC
  Administered 2016-07-30: 10 meq via INTRAVENOUS
  Filled 2016-07-30: qty 100

## 2016-07-30 NOTE — Progress Notes (Signed)
[facility] PROGRESS NOTE  [name] VHQ:[id number] DOB: 1935/10/13 DOA: 07/29/2016 PCP: Galvin Proffer, MD  Assessment/Plan: 81 y/o female with PMH of DM, HTN, ICH, Dementia, who presents the emergency department from her nursing facility for evaluation of altered mental status, noted to have acute onset of right arm shaking, followed by "drawing up of her right side," and lack of verbal responsiveness. ED: Noncontrast head CT was obtained and negative for acute intracranial abnormality. A code stroke was activated prior to the patient's arrival and neurology evaluated the patient in the ED. It was suspected that the patient's presentation was secondary to seizure rather than CVA and transfer to [facility] in order to obtain a continuous EEG was advised by the neurologist. She was loaded with 1 g of Keppra intravenously and potassium was supplemented with 20 mEq IV.   Altered mental status. Code stroke was activated and neurology suspects seizure rather than CVA; Keppra load and transfer to [facility] for continuous EEG was advised.  -MRI/MRA: no acute stroke. Non-contrast head CT neg for acute intracranial abnormality; CTA head and neck with no major vessel occlusion or flow-limiting stenosis; prox left ICA with 25% stenosis and lateral wall ulceration  -cont keppra, continuous EEG. Awaiting neurology eval . D/c tramadol   Hypokalemia, replace monitor potassium level  -hypernatremia. Change IVF to 1/2 NS. Repeat labs AM  Hypothyroidism. Will check thyroid studies given her presentation with AMS  - Continue current-dose Synthroid for now  Dementia. Alzheimer's per chart notes.Continue memantine, donepezil Hypertension. Cont monitor on Lopressor, Norvasc, lisinopril at home  DM A1c was 6.6% in 2011. Hold metformin. Cont ISS. check A1c   Code Status: DNR Family Communication: d/w patient, neurology, RN (indicate person spoken with, relationship, and if by phone, the  number) Disposition Plan: SNF    Consultants:  Neurology   Procedures:  Pend EEG  Antibiotics:  None  (indicate start date, and stop date if known)  HPI/Subjective: Lethargic. Follows commands minimally, responds to stimuli  Objective: Vitals:   07/30/16 0500 07/30/16 0952  BP: (!) 147/77 (!) 124/103  Pulse: 70 67  Resp: 16 18  Temp: 97.9 F (36.6 C) 98.3 F (36.8 C)   No intake or output data in the 24 hours ending 07/30/16 1055 Filed Weights   07/29/16 1428  Weight: 60.8 kg (134 lb)    Exam:   General:  Comfortable   Cardiovascular: s1,s2 rrr  Respiratory: ACT BL  Abdomen: soft, nt,nd   Musculoskeletal: no pedal edema    Data Reviewed: Basic Metabolic Panel:  Recent Labs Lab 07/29/16 1449 07/29/16 1454 07/29/16 2202  NA 144 149* 149*  K 2.6* 2.6* 3.1*  CL 111 108 112*  CO2 26  --   --   GLUCOSE 121* 116* 111*  BUN 12 10 6   CREATININE 0.82 0.80 0.80  CALCIUM 9.8  --   --   MG 2.1  --   --    Liver Function Tests:  Recent Labs Lab 07/29/16 1449  AST 17  ALT 13*  ALKPHOS 88  BILITOT 0.3  PROT 7.3  ALBUMIN 3.7   No results for input(s): LIPASE, AMYLASE in the last 168 hours. No results for input(s): AMMONIA in the last 168 hours. CBC:  Recent Labs Lab 07/29/16 1449 07/29/16 1454 07/29/16 2202  WBC 7.4  --   --   NEUTROABS 4.3  --   --   HGB 12.3 13.3 11.9*  HCT 38.0 39.0 35.0*  MCV  86.8  --   --   PLT 167  --   --    Cardiac Enzymes: No results for input(s): CKTOTAL, CKMB, CKMBINDEX, TROPONINI in the last 168 hours. BNP (last 3 results) No results for input(s): BNP in the last 8760 hours.  ProBNP (last 3 results) No results for input(s): PROBNP in the last 8760 hours.  CBG:  Recent Labs Lab 07/29/16 1449 07/30/16 0011 07/30/16 0438  GLUCAP 115* 106* 102*    Recent Results (from the past 240 hour(s))  MRSA PCR Screening     Status: None   Collection Time: 07/29/16 11:45 PM  Result Value Ref Range Status    MRSA by PCR NEGATIVE NEGATIVE Final    Comment:        The GeneXpert MRSA Assay (FDA approved for NASAL specimens only), is one component of a comprehensive MRSA colonization surveillance program. It is not intended to diagnose MRSA infection nor to guide or monitor treatment for MRSA infections.      Studies: Ct Angio Head W Or Wo Contrast  Result Date: 07/29/2016 CLINICAL DATA:  Right hand trembling. Right-sided mouth droop. Alzheimer's disease. EXAM: CT ANGIOGRAPHY HEAD AND NECK TECHNIQUE: Multidetector CT imaging of the head and neck was performed using the standard protocol during bolus administration of intravenous contrast. Multiplanar CT image reconstructions and MIPs were obtained to evaluate the vascular anatomy. Carotid stenosis measurements (when applicable) are obtained utilizing NASCET criteria, using the distal internal carotid diameter as the denominator. CONTRAST:  75 cc Isovue 370 intravenous COMPARISON:  Noncontrast head CT from earlier today. FINDINGS: CTA NECK Aortic arch: 4 vessel branching pattern. Diffuse atherosclerotic calcification. Right carotid system: Scattered predominately calcified atherosclerotic plaque. No flow limiting stenosis or dissection. No plaque ulceration. Occasional artifact. Left carotid system: Intermittent artifact. Scattered atheromatous calcification, greatest at the bifurcation. Stenosis measures up to 25% at the proximal ICA. No evidence of dissection. Shallow excavation of plaque along the lateral proximal ICA. Vertebral arteries:No proximal subclavian stenosis on the right. Right vertebral artery dominance, sole contributor to the basilar. No flow limiting stenosis. No evidence of dissection when allowing for levels of artifact. Skeleton: No acute finding. Other neck: No incidental mass or adenopathy in the neck. CTA HEAD Anterior circulation: Scattered atheromatous calcifications greatest at the bilateral siphons. No major branch occlusion or  flow limiting stenosis. Negative for aneurysm. Posterior circulation: Strongly dominant right vertebral artery. Fetal type right PCA. No major branch occlusion or flow limiting stenosis. Venous sinuses: Patent Anatomic variants: Fetal type PCA on the right. Left posterior communicating artery infundibulum. Delayed phase: Negative for mass or other abnormal enhancement. Atrophy and chronic microvascular disease as previously described. IMPRESSION: 1. No major vessel occlusion or flow limiting stenosis. 2. Proximal left ICA stenosis measuring ~25% with lateral wall plaque ulceration. Electronically Signed   By: Marnee Spring M.D.   On: 07/29/2016 18:08  Ct Head Wo Contrast  Result Date: 07/29/2016 CLINICAL DATA:  Right facial droop today.  Code stroke EXAM: CT HEAD WITHOUT CONTRAST TECHNIQUE: Contiguous axial images were obtained from the base of the skull through the vertex without intravenous contrast. COMPARISON:  04/24/2016 FINDINGS: There is atrophy and chronic small vessel disease changes. No acute intracranial abnormality. Specifically, no hemorrhage, hydrocephalus, mass lesion, acute infarction, or significant intracranial injury. No acute calvarial abnormality. Visualized paranasal sinuses and mastoids clear. Orbital soft tissues unremarkable. IMPRESSION: No acute intracranial abnormality. Atrophy, chronic microvascular disease. Critical Value/emergent results were called by telephone at the time of interpretation on  07/29/2016 at 3:05 pm to Dr. Hyacinth Meeker , who verbally acknowledged these results. Electronically Signed   By: Charlett Nose M.D.   On: 07/29/2016 15:06  Ct Angio Neck W And/or Wo Contrast  Result Date: 07/29/2016 CLINICAL DATA:  Right hand trembling. Right-sided mouth droop. Alzheimer's disease. EXAM: CT ANGIOGRAPHY HEAD AND NECK TECHNIQUE: Multidetector CT imaging of the head and neck was performed using the standard protocol during bolus administration of intravenous contrast. Multiplanar  CT image reconstructions and MIPs were obtained to evaluate the vascular anatomy. Carotid stenosis measurements (when applicable) are obtained utilizing NASCET criteria, using the distal internal carotid diameter as the denominator. CONTRAST:  75 cc Isovue 370 intravenous COMPARISON:  Noncontrast head CT from earlier today. FINDINGS: CTA NECK Aortic arch: 4 vessel branching pattern. Diffuse atherosclerotic calcification. Right carotid system: Scattered predominately calcified atherosclerotic plaque. No flow limiting stenosis or dissection. No plaque ulceration. Occasional artifact. Left carotid system: Intermittent artifact. Scattered atheromatous calcification, greatest at the bifurcation. Stenosis measures up to 25% at the proximal ICA. No evidence of dissection. Shallow excavation of plaque along the lateral proximal ICA. Vertebral arteries:No proximal subclavian stenosis on the right. Right vertebral artery dominance, sole contributor to the basilar. No flow limiting stenosis. No evidence of dissection when allowing for levels of artifact. Skeleton: No acute finding. Other neck: No incidental mass or adenopathy in the neck. CTA HEAD Anterior circulation: Scattered atheromatous calcifications greatest at the bilateral siphons. No major branch occlusion or flow limiting stenosis. Negative for aneurysm. Posterior circulation: Strongly dominant right vertebral artery. Fetal type right PCA. No major branch occlusion or flow limiting stenosis. Venous sinuses: Patent Anatomic variants: Fetal type PCA on the right. Left posterior communicating artery infundibulum. Delayed phase: Negative for mass or other abnormal enhancement. Atrophy and chronic microvascular disease as previously described. IMPRESSION: 1. No major vessel occlusion or flow limiting stenosis. 2. Proximal left ICA stenosis measuring ~25% with lateral wall plaque ulceration. Electronically Signed   By: Marnee Spring M.D.   On: 07/29/2016 18:08  Mr  Brain Wo Contrast  Result Date: 07/30/2016 CLINICAL DATA:  Stroke.  Advanced dementia. EXAM: MRI HEAD WITHOUT CONTRAST MRA HEAD WITHOUT CONTRAST TECHNIQUE: Multiplanar, multiecho pulse sequences of the brain and surrounding structures were obtained without intravenous contrast. Angiographic images of the head were obtained using MRA technique without contrast. COMPARISON:  Head neck CTA from yesterday FINDINGS: MRI HEAD FINDINGS Calvarium and upper cervical spine: Large partly seen cystic structure in the right paramedian maxilla, likely periapical granuloma. No surrounding inflammation. No focal marrow abnormality. Orbits: Negative. Sinuses and Mastoids: Clear. Brain: No acute or remote infarct, hemorrhage, hydrocephalus, or mass lesion. Negative for extra-axial collection. No evidence of large vessel occlusion. Atrophy with secondary ventriculomegaly. Mesial temporal volume loss is moderate to advanced, correlating with history of Alzheimer's disease. Mild to moderate for age chronic microvascular disease with confluent ischemic gliosis in the periventricular white matter. Less extensive microvascular change in the pons. MRA HEAD FINDINGS Dominant right vertebral artery. Left vertebral artery ends in PICA. Fetal type right PCA. No major branch occlusion or flow limiting stenosis. Siphon and MCA luminal irregularity correlating with atheromatous changes seen on comparison CTA. Outpouchings from the left supraclinoid carotid appear chronically shaped and favor 1-2 mm infundibula. IMPRESSION: 1. No acute finding, including infarct. 2. Atrophy in keeping with history of Alzheimer's disease. 3. Intracranial MRA without acute finding or flow limiting stenosis. Electronically Signed   By: Marnee Spring M.D.   On: 07/30/2016 08:11  Dg Chest  Port 1 View  Result Date: 07/29/2016 CLINICAL DATA:  Altered mental status. EXAM: PORTABLE CHEST 1 VIEW COMPARISON:  05/04/2016 FINDINGS: Low lung volumes with bibasilar  atelectasis. Heart is normal size. No effusions. No acute bony abnormality. IMPRESSION: Low lung volumes with bibasilar atelectasis. Electronically Signed   By: Charlett Nose M.D.   On: 07/29/2016 15:45  Mr Maxine Glenn Head/brain ZO Cm  Result Date: 07/30/2016 CLINICAL DATA:  Stroke.  Advanced dementia. EXAM: MRI HEAD WITHOUT CONTRAST MRA HEAD WITHOUT CONTRAST TECHNIQUE: Multiplanar, multiecho pulse sequences of the brain and surrounding structures were obtained without intravenous contrast. Angiographic images of the head were obtained using MRA technique without contrast. COMPARISON:  Head neck CTA from yesterday FINDINGS: MRI HEAD FINDINGS Calvarium and upper cervical spine: Large partly seen cystic structure in the right paramedian maxilla, likely periapical granuloma. No surrounding inflammation. No focal marrow abnormality. Orbits: Negative. Sinuses and Mastoids: Clear. Brain: No acute or remote infarct, hemorrhage, hydrocephalus, or mass lesion. Negative for extra-axial collection. No evidence of large vessel occlusion. Atrophy with secondary ventriculomegaly. Mesial temporal volume loss is moderate to advanced, correlating with history of Alzheimer's disease. Mild to moderate for age chronic microvascular disease with confluent ischemic gliosis in the periventricular white matter. Less extensive microvascular change in the pons. MRA HEAD FINDINGS Dominant right vertebral artery. Left vertebral artery ends in PICA. Fetal type right PCA. No major branch occlusion or flow limiting stenosis. Siphon and MCA luminal irregularity correlating with atheromatous changes seen on comparison CTA. Outpouchings from the left supraclinoid carotid appear chronically shaped and favor 1-2 mm infundibula. IMPRESSION: 1. No acute finding, including infarct. 2. Atrophy in keeping with history of Alzheimer's disease. 3. Intracranial MRA without acute finding or flow limiting stenosis. Electronically Signed   By: Marnee Spring M.D.    On: 07/30/2016 08:11   Scheduled Meds: . sodium chloride   Intravenous STAT  . amLODipine  10 mg Oral Daily  . aspirin  300 mg Rectal Daily  . cholecalciferol  1,000 Units Oral Daily  . citalopram  20 mg Oral Daily  . memantine  28 mg Oral Daily   And  . donepezil  10 mg Oral Daily  . enoxaparin (LOVENOX) injection  40 mg Subcutaneous Q24H  . insulin aspart  0-9 Units Subcutaneous Q4H  . levETIRAcetam  500 mg Intravenous Q12H  . levothyroxine  25 mcg Oral QAC breakfast  . lisinopril  40 mg Oral Daily  . magnesium oxide  400 mg Oral BID  . metoprolol tartrate  25 mg Oral BID  . pantoprazole  40 mg Oral Daily  . polyethylene glycol  17 g Oral Daily  . polyvinyl alcohol  1 drop Both Eyes BID  . pravastatin  40 mg Oral q1800  . topiramate  50 mg Oral BID   Continuous Infusions:   Principal Problem:   Altered mental status Active Problems:   Hypothyroidism   Essential hypertension   Type 2 diabetes mellitus with hyperglycemia (HCC)   Dementia   Hypokalemia    Time spent: >35 minutes     Esperanza Sheets  [facility] Pager [phone]. If 7PM-7AM, please contact night-coverage at [email or web address], password [facility] 07/30/2016, 10:55 AM  LOS: 0 days

## 2016-07-30 NOTE — Evaluation (Signed)
Clinical/Bedside Swallow Evaluation Patient Details  Name: KAMARIE VENO MRN: [id number] Date of Birth: 05-15-1935  Today's Date: 07/30/2016 Time: SLP Start Time (ACUTE ONLY): 0803 SLP Stop Time (ACUTE ONLY): 0816 SLP Time Calculation (min) (ACUTE ONLY): 13 min  Past Medical History:  Past Medical History:  Diagnosis Date  . Alzheimer disease    per facility paperwork dated 05/04/2016  . Anxiety   . Dementia   . Diabetes mellitus   . Hypercholesteremia   . Hypertension   . Intracranial bleeding (HCC)    10 years ago  . Seizures (HCC)    approx 10 years when she had brain bleed  . Thyroid disease    Past Surgical History:  Past Surgical History:  Procedure Laterality Date  . ABDOMINAL HYSTERECTOMY    . ANTERIOR APPROACH HEMI HIP ARTHROPLASTY Right 05/07/2016   Procedure: ANTERIOR APPROACH HEMI HIP ARTHROPLASTY VERSUS TOTAL HIP ARTHROPLASTY;  Surgeon: Samson Frederic, MD;  Location: [facility] OR;  Service: Orthopedics;  Laterality: Right;  . CHOLECYSTECTOMY     HPI:  Arminta Gamm Pitzenis a 81 y.o.femalewith medical history significant for advanced dementia, hypertension, type 2 diabetes mellitus, hypothyroidism, and anxiety who presents the emergency department from her nursing facility for evaluation of altered mental status. Chest x-rays notable for low volumes with bibasilar atelectasis. Noncontrast head CT was obtained and negative for acute intracranial abnormality. Per chart it was suspected that the patient's presentation was secondary to seizure rather than CVA, hypokalemia.   Assessment / Plan / Recommendation Clinical Impression  Pt drowsy, keeps eyes closed. Did not follow commands or phonate during assessment. Oral and pharyngeal phases WFL's with thin and puree; no s/s aspiration present. Cognitive impairments increase risk.  Unable to orally accept or manipulate solid texture. Recommend, when pt adequately alert, Dys 1 (puree) texture and thin liquids, straws allowed, crush  pills and full supervision and assist. Follow short term.     Aspiration Risk  Moderate aspiration risk    Diet Recommendation Dysphagia 1 (Puree);Thin liquid   Liquid Administration via: Cup;Straw Medication Administration: Crushed with puree Supervision: Patient able to self feed;Staff to assist with self feeding;Full supervision/cueing for compensatory strategies Compensations: Minimize environmental distractions;Slow rate;Small sips/bites Postural Changes: Seated upright at 90 degrees    Other  Recommendations Oral Care Recommendations: Oral care BID   Follow up Recommendations   (TBD)    Frequency and Duration min 1 x/week  1 week       Prognosis Prognosis for Safe Diet Advancement: Fair Barriers to Reach Goals: Cognitive deficits      Swallow Study   General HPI: Josphine Laffey Pitzenis a 81 y.o.femalewith medical history significant for advanced dementia, hypertension, type 2 diabetes mellitus, hypothyroidism, and anxiety who presents the emergency department from her nursing facility for evaluation of altered mental status. Chest x-rays notable for low volumes with bibasilar atelectasis. Noncontrast head CT was obtained and negative for acute intracranial abnormality. Per chart it was suspected that the patient's presentation was secondary to seizure rather than CVA, hypokalemia. Type of Study: Bedside Swallow Evaluation Previous Swallow Assessment:  (none) Diet Prior to this Study: NPO Temperature Spikes Noted: No Respiratory Status: Room air History of Recent Intubation: No Behavior/Cognition: Lethargic/Drowsy;Cooperative;Requires cueing Oral Cavity Assessment: Within Functional Limits Oral Care Completed by SLP: Yes Oral Cavity - Dentition: Adequate natural dentition Vision: Functional for self-feeding Self-Feeding Abilities: Total assist Patient Positioning: Upright in bed Baseline Vocal Quality:  (no vocalizations) Volitional Cough: Cognitively unable to  elicit Volitional Swallow: Unable to  elicit    Oral/Motor/Sensory Function Overall Oral Motor/Sensory Function:  (pt did not respond to commands)   Ice Chips Ice chips: Not tested   Thin Liquid Thin Liquid: Within functional limits Presentation: Cup;Straw    Nectar Thick Nectar Thick Liquid: Not tested   Honey Thick Honey Thick Liquid: Not tested   Puree Puree: Within functional limits Presentation: Spoon   Solid   GO   Solid: Impaired Oral Phase Impairments: Reduced labial seal (unable to bring cracker into oral cavity)        Roque Cash, Breck Coons 07/30/2016,8:26 AM   Breck Coons Lonell Face.Ed [employer] [phone]

## 2016-07-30 NOTE — Procedures (Signed)
ELECTROENCEPHALOGRAM REPORT  Date of Study: 07/30/2016  Patient's Name: [name] MRN: [id number] Date of Birth: 09/25/1935  Referring Provider: Dr. Marcial Pacas Opyd  Clinical History: This is a 81 year old woman with altered mental status after an episode of right arm shaking, followed by "drawing up of her right side," and lack of verbal responsiveness.   Medications: levETIRAcetam (KEPPRA) 500 mg in sodium chloride 0.9 % 100 mL IVPB  topiramate (TOPAMAX) tablet 50 mg  acetaminophen (TYLENOL) tablet 1,000 mg  amLODipine (NORVASC) tablet 10 mg  aspirin suppository 300 mg  cholecalciferol (VITAMIN D) tablet 1,000 Units  citalopram (CELEXA) tablet 20 mg  donepezil (ARICEPT) tablet 10 mg  levothyroxine (SYNTHROID, LEVOTHROID) tablet 25 mcg  lisinopril (PRINIVIL,ZESTRIL) tablet 40 mg  magnesium oxide (MAG-OX) tablet 400 mg  memantine (NAMENDA XR) 24 hr capsule 28 mg  metoprolol tartrate (LOPRESSOR) tablet 25 mg  pantoprazole (PROTONIX) EC tablet 40 mg  pravastatin (PRAVACHOL) tablet 40 mg  traMADol (ULTRAM) tablet 50 mg   Technical Summary: A multichannel digital EEG recording measured by the international 10-20 system with electrodes applied with paste and impedances below 5000 ohms performed as portable with EKG monitoring in a predominantly drowsy and asleep patient.  Hyperventilation and photic stimulation were not performed.  The digital EEG was referentially recorded, reformatted, and digitally filtered in a variety of bipolar and referential montages for optimal display.   Description: The patient is unresponsive, predominantly drowsy and asleep during the recording.  There is no clear posterior dominant rhythm. The background consists of a moderate amount of diffuse 4-5 Hz theta and 2-3 Hz delta slowing. During deeper stages of sleep, poorly formed sleep spindles are seen. Hyperventilation and photic stimulation were not performed.  There were no epileptiform discharges or  electrographic seizures seen.    EKG lead showed sinus bradycardia  Impression: This predominantly drowsy and asleep EEG is abnormal due to mild to moderate diffuse slowing of the background.  Clinical Correlation of the above findings indicates diffuse cerebral dysfunction that is non-specific in etiology and can be seen with hypoxic/ischemic injury, toxic/metabolic encephalopathies, neurodegenerative disorders, or medication effect.  The absence of epileptiform discharges does not rule out a clinical diagnosis of epilepsy.  Clinical correlation is advised.   Patrcia Dolly, M.D.

## 2016-07-30 NOTE — Care Management Note (Addendum)
Case Management Note  Patient Details  Name: [name] MRN: [id number] Date of Birth: 11/10/1935  Subjective/Objective:   Pt admitted with Altered mental status. She is from [facility] [place] in the memory care unit. She is wheelchair bound at baseline per [facility].               Action/Plan: OT recommends SNF. Awaiting PT recommendations. CM following for discharge disposition.   Expected Discharge Date:                  Expected Discharge Plan:  Skilled Nursing Facility  In-House Referral:     Discharge planning Services     Post Acute Care Choice:    Choice offered to:     DME Arranged:    DME Agency:     HH Arranged:    HH Agency:     Status of Service:  In process, will continue to follow  If discussed at Long Length of Stay Meetings, dates discussed:    Additional Comments:  Kermit Balo, RN 07/30/2016, 3:37 PM

## 2016-07-30 NOTE — Evaluation (Signed)
Occupational Therapy Evaluation Patient Details Name: [name] MRN: [id number] DOB: 1935/09/28 Today's Date: 07/30/2016    History of Present Illness 81 yo female admitted with R facial droop and lack of verbal responsiveness MRI (neg) workup for seizures underway  PMH: DM HTN ICH dementia    Clinical Impression   PT admitted with workup still underway. Pt currently with functional limitiations due to the deficits listed below (see OT problem list). PTA unknown but chart review indicates admission from SNF. OT unable to reach family.  Pt will benefit from skilled OT to increase their independence and safety with adls and balance to allow discharge SNF.     Follow Up Recommendations  SNF    Equipment Recommendations  Wheelchair cushion (measurements OT);Wheelchair (measurements OT);Hospital bed    Recommendations for Other Services       Precautions / Restrictions Precautions Precautions: Fall      Mobility Bed Mobility Overal bed mobility: Needs Assistance;+2 for physical assistance;+ 2 for safety/equipment Bed Mobility: Supine to Sit;Rolling Rolling: Total assist   Supine to sit: Total assist     General bed mobility comments: no startle response to response to movement. pt static stare  Transfers                 General transfer comment: not appropriate at this time    Balance                                            ADL Overall ADL's : Needs assistance/impaired                                       General ADL Comments: pt supine on arrival and with stimulation remains lethargic. pt opening L eye and looking in a static stare. pt incontinent of bladder on arrival. Rn notified odor to urine. pt log rolled R and L total (A) for peri care and bed linen change. pt with no verbal responses or visual attention to therapist. pt with flat affect. pt moving in a log like pattern with in ability to segment UB from LB.  pt with bil UE adducted to core and requires gown thread over and around elbow due to holding in a elbow 90 flexion adducted to body.      Vision     Perception     Praxis      Pertinent Vitals/Pain Pain Assessment: No/denies pain     Hand Dominance  (unknown)   Extremity/Trunk Assessment Upper Extremity Assessment Upper Extremity Assessment: Difficult to assess due to impaired cognition   Lower Extremity Assessment Lower Extremity Assessment: Difficult to assess due to impaired cognition   Cervical / Trunk Assessment Cervical / Trunk Assessment: Kyphotic   Communication Communication Communication: No difficulties   Cognition Arousal/Alertness: Lethargic Behavior During Therapy: Flat affect Overall Cognitive Status: History of cognitive impairments - at baseline                     General Comments       Exercises       Shoulder Instructions      Home Living Family/patient expects to be discharged to:: Skilled nursing facility  Additional Comments: Per notes in 04/2016 patient was residing at ALF and admitted for direct anterior hip s/p fall. Recommendations at that time were SNF. No family present and unable to reach via phone to confirm location and Alf vs SNF setting.       Prior Functioning/Environment Level of Independence: Needs assistance    ADL's / Homemaking Assistance Needed: (A) at baseline per chart review with incr need after admission in 04/2016        OT Diagnosis: Generalized weakness;Cognitive deficits   OT Problem List: Decreased strength;Decreased activity tolerance;Impaired balance (sitting and/or standing);Decreased safety awareness;Decreased knowledge of use of DME or AE;Decreased knowledge of precautions;Impaired UE functional use;Cardiopulmonary status limiting activity;Decreased coordination;Decreased cognition;Decreased range of motion   OT Treatment/Interventions: Self-care/ADL  training;Therapeutic exercise;DME and/or AE instruction;Therapeutic activities;Cognitive remediation/compensation;Visual/perceptual remediation/compensation;Patient/family education;Balance training    OT Goals(Current goals can be found in the care plan section) Acute Rehab OT Goals OT Goal Formulation: Patient unable to participate in goal setting Potential to Achieve Goals: Poor ADL Goals Additional ADL Goal #2: Pt will complete bed mobility min (A) level as precursor to adls  OT Frequency: Min 2X/week   Barriers to D/C:    uncertain of PTA levels       Co-evaluation              End of Session Nurse Communication: Mobility status;Precautions  Activity Tolerance: Patient tolerated treatment well Patient left: in bed;with call bell/phone within reach;with bed alarm set   Time: 7209-4709 OT Time Calculation (min): 24 min Charges:  OT General Charges $OT Visit: 1 Procedure OT Evaluation $OT Eval Moderate Complexity: 1 Procedure OT Treatments $Self Care/Home Management : 8-22 mins G-Codes: OT G-codes **NOT FOR INPATIENT CLASS** Functional Assessment Tool Used: clinical judgemetn Functional Limitation: Self care Self Care Current Status (G2836): 100 percent impaired, limited or restricted Self Care Goal Status (O2947): At least 40 percent but less than 60 percent impaired, limited or restricted  Boone Master B 07/30/2016, 12:29 PM  Mateo Flow   OTR/L Pager: [phone] Office: [phone] .

## 2016-07-30 NOTE — Consult Note (Addendum)
NEURO HOSPITALIST CONSULT NOTE   Requestig physician: Dr. York Spaniel   Reason for Consult: possible NCSE    HPI:                                                                                                                                          [name] is an 81 y.o. female  with medical history significant for advanced dementia, hypertension, type 2 diabetes mellitus, hypothyroidism, and anxiety who presents the emergency department from her nursing facility for evaluation of altered mental status. Patient was reportedly in her usual state in until approximately noon on the day of her admission when she was noted to develop acute onset of right arm shaking, followed by "drawing up of her right side," and lack of verbal responsiveness. Patient has advanced dementia, but reportedly provides verbal responses at her baseline. She would not speak to staff following this episode at the nursing facility and she was transported to the emergency department for further evaluation. She has not exhibited similar behavior previously, has not voiced any recent complaints, and there is been no recent change in her medications.  EKG demonstrates a sinus rhythm with right bundle branch block and chest x-rays notable for low volumes with bibasilar atelectasis. CMP features a serum potassium of 2.6. CBC is unremarkable, INR is normal, troponin is undetectable, UDS is negative, and urinalysis is unremarkable. Noncontrast head CT was obtained and negative for acute intracranial abnormality. A code stroke was activated prior to the patient's arrival and neurology evaluated the patient in the ED. It was suspected that the patient's presentation was secondary to seizure rather than CVA and transfer to [facility] in order to obtain a continuous EEG was advised by the neurologist. She was loaded with 1 g of Keppra intravenously and potassium was supplemented with 20 mEq IV. Patient arrived  to  and Neurology was consulted to be on board with IM.   -MRI/MRA: no acute stroke. Non-contrast head CT neg for acute intracranial abnormality; CTA head and neck with no major vessel occlusion or flow-limiting stenosis; prox left ICA with 25% stenosis and lateral wall ulceration   On consultation, she has a very flat affect and does not want to take part in exam. She does not know where she is. No clinical seizure noted.   Past Medical History:  Diagnosis Date  . Alzheimer disease    per facility paperwork dated 05/04/2016  . Anxiety   . Dementia   . Diabetes mellitus   . Hypercholesteremia   . Hypertension   . Intracranial bleeding (HCC)    10 years ago  . Seizures (HCC)    approx 10 years when she had brain bleed  . Thyroid disease     Past Surgical History:  Procedure Laterality Date  . ABDOMINAL HYSTERECTOMY    . ANTERIOR APPROACH HEMI HIP ARTHROPLASTY Right 05/07/2016   Procedure: ANTERIOR APPROACH HEMI HIP ARTHROPLASTY VERSUS TOTAL HIP ARTHROPLASTY;  Surgeon: Samson Frederic, MD;  Location: [facility] OR;  Service: Orthopedics;  Laterality: Right;  . CHOLECYSTECTOMY      Family History  Problem Relation Age of Onset  . Hypertension Other      Social History:  reports that she has quit smoking. She has quit using smokeless tobacco. She reports that she does not drink alcohol or use drugs.  Allergies  Allergen Reactions  . Penicillins Nausea And Vomiting    Has patient had a PCN reaction causing immediate rash, facial/tongue/throat swelling, SOB or lightheadedness with hypotension: {unknown Has patient had a PCN reaction causing severe rash involving mucus membranes or skin necrosis: unknown Has patient had a PCN reaction that required hospitalization: unknown Has patient had a PCN reaction occurring within the last 10 years: unknown If all of the above answers are "NO", then may proceed with Cephalosporin use.   . Promethazine Hcl Nausea And Vomiting  .  Sulfonamide Derivatives Other (See Comments)    unknown    MEDICATIONS:                                                                                                                     Scheduled: . amLODipine  10 mg Oral Daily  . aspirin  300 mg Rectal Daily  . cholecalciferol  1,000 Units Oral Daily  . citalopram  20 mg Oral Daily  . memantine  28 mg Oral Daily   And  . donepezil  10 mg Oral Daily  . enoxaparin (LOVENOX) injection  40 mg Subcutaneous Q24H  . insulin aspart  0-9 Units Subcutaneous Q4H  . levETIRAcetam  500 mg Intravenous Q12H  . levothyroxine  25 mcg Oral QAC breakfast  . lisinopril  40 mg Oral Daily  . magnesium oxide  400 mg Oral BID  . metoprolol tartrate  25 mg Oral BID  . pantoprazole  40 mg Oral Daily  . polyethylene glycol  17 g Oral Daily  . polyvinyl alcohol  1 drop Both Eyes BID  . potassium chloride  10 mEq Intravenous Q1 Hr x 4  . pravastatin  40 mg Oral q1800  . topiramate  50 mg Oral BID     ROS:  History obtained from unobtainable from patient due to mental status    Blood pressure (!) 124/103, pulse 67, temperature 98.3 F (36.8 C), temperature source Oral, resp. rate 18, weight 60.8 kg (134 lb), SpO2 96 %.   Neurologic Examination:                                                                                                      HEENT-  Normocephalic, no lesions, without obvious abnormality.  Normal external eye and conjunctiva.  Normal TM's bilaterally.  Normal auditory canals and external ears. Normal external nose, mucus membranes and septum.  Normal pharynx. Cardiovascular- S1, S2 normal, pulses palpable throughout   Lungs- no tachypnea, retractions or cyanosis, Heart exam - S1, S2 normal, no murmur, no gallop, rate regular Abdomen- normal findings: bowel sounds normal Extremities- no edema Lymph-no  adenopathy palpable Musculoskeletal-no joint tenderness, deformity or swelling Skin-warm and dry, no hyperpigmentation, vitiligo, or suspicious lesions  Neurological Examination Mental Status: Alert, not oriented, flat affect.  Speech fluent without evidence of aphasia.   Cranial Nerves: II:  Visual fields grossly normal, pupils equal, round, reactive to light and accommodation III,IV, VI: ptosis not present, extra-ocular motions intact bilaterally V,VII: smile symmetric, facial light touch sensation normal bilaterally VIII: hearing normal bilaterally IX,X: uvula rises symmetrically XI: bilateral shoulder shrug XII: midline tongue extension Motor: Moving all extremities antigravity with no increased tone or rigidity.  Sensory: Pinprick and light touch intact throughout, bilaterally Deep Tendon Reflexes: 1+ and symmetric throughout Plantars: Right: downgoing   Left: downgoing Cerebellar: Would not take part in exam Gait: not tested      Lab Results: Basic Metabolic Panel:  Recent Labs Lab 07/29/16 1449 07/29/16 1454 07/29/16 2202  NA 144 149* 149*  K 2.6* 2.6* 3.1*  CL 111 108 112*  CO2 26  --   --   GLUCOSE 121* 116* 111*  BUN 12 10 6   CREATININE 0.82 0.80 0.80  CALCIUM 9.8  --   --   MG 2.1  --   --     Liver Function Tests:  Recent Labs Lab 07/29/16 1449  AST 17  ALT 13*  ALKPHOS 88  BILITOT 0.3  PROT 7.3  ALBUMIN 3.7   No results for input(s): LIPASE, AMYLASE in the last 168 hours. No results for input(s): AMMONIA in the last 168 hours.  CBC:  Recent Labs Lab 07/29/16 1449 07/29/16 1454 07/29/16 2202  WBC 7.4  --   --   NEUTROABS 4.3  --   --   HGB 12.3 13.3 11.9*  HCT 38.0 39.0 35.0*  MCV 86.8  --   --   PLT 167  --   --     Cardiac Enzymes: No results for input(s): CKTOTAL, CKMB, CKMBINDEX, TROPONINI in the last 168 hours.  Lipid Panel: No results for input(s): CHOL, TRIG, HDL, CHOLHDL, VLDL, LDLCALC in the last 168  hours.  CBG:  Recent Labs Lab 07/29/16 1449 07/30/16 0011 07/30/16 0438  GLUCAP 115* 106* 102*    Microbiology: Results for orders placed or performed during the  hospital encounter of 07/29/16  MRSA PCR Screening     Status: None   Collection Time: 07/29/16 11:45 PM  Result Value Ref Range Status   MRSA by PCR NEGATIVE NEGATIVE Final    Comment:        The GeneXpert MRSA Assay (FDA approved for NASAL specimens only), is one component of a comprehensive MRSA colonization surveillance program. It is not intended to diagnose MRSA infection nor to guide or monitor treatment for MRSA infections.     Coagulation Studies:  Recent Labs  07/29/16 1449  LABPROT 13.9  INR 1.06    Imaging: Ct Angio Head W Or Wo Contrast  Result Date: 07/29/2016 CLINICAL DATA:  Right hand trembling. Right-sided mouth droop. Alzheimer's disease. EXAM: CT ANGIOGRAPHY HEAD AND NECK TECHNIQUE: Multidetector CT imaging of the head and neck was performed using the standard protocol during bolus administration of intravenous contrast. Multiplanar CT image reconstructions and MIPs were obtained to evaluate the vascular anatomy. Carotid stenosis measurements (when applicable) are obtained utilizing NASCET criteria, using the distal internal carotid diameter as the denominator. CONTRAST:  75 cc Isovue 370 intravenous COMPARISON:  Noncontrast head CT from earlier today. FINDINGS: CTA NECK Aortic arch: 4 vessel branching pattern. Diffuse atherosclerotic calcification. Right carotid system: Scattered predominately calcified atherosclerotic plaque. No flow limiting stenosis or dissection. No plaque ulceration. Occasional artifact. Left carotid system: Intermittent artifact. Scattered atheromatous calcification, greatest at the bifurcation. Stenosis measures up to 25% at the proximal ICA. No evidence of dissection. Shallow excavation of plaque along the lateral proximal ICA. Vertebral arteries:No proximal subclavian  stenosis on the right. Right vertebral artery dominance, sole contributor to the basilar. No flow limiting stenosis. No evidence of dissection when allowing for levels of artifact. Skeleton: No acute finding. Other neck: No incidental mass or adenopathy in the neck. CTA HEAD Anterior circulation: Scattered atheromatous calcifications greatest at the bilateral siphons. No major branch occlusion or flow limiting stenosis. Negative for aneurysm. Posterior circulation: Strongly dominant right vertebral artery. Fetal type right PCA. No major branch occlusion or flow limiting stenosis. Venous sinuses: Patent Anatomic variants: Fetal type PCA on the right. Left posterior communicating artery infundibulum. Delayed phase: Negative for mass or other abnormal enhancement. Atrophy and chronic microvascular disease as previously described. IMPRESSION: 1. No major vessel occlusion or flow limiting stenosis. 2. Proximal left ICA stenosis measuring ~25% with lateral wall plaque ulceration. Electronically Signed   By: Marnee Spring M.D.   On: 07/29/2016 18:08  Ct Head Wo Contrast  Result Date: 07/29/2016 CLINICAL DATA:  Right facial droop today.  Code stroke EXAM: CT HEAD WITHOUT CONTRAST TECHNIQUE: Contiguous axial images were obtained from the base of the skull through the vertex without intravenous contrast. COMPARISON:  04/24/2016 FINDINGS: There is atrophy and chronic small vessel disease changes. No acute intracranial abnormality. Specifically, no hemorrhage, hydrocephalus, mass lesion, acute infarction, or significant intracranial injury. No acute calvarial abnormality. Visualized paranasal sinuses and mastoids clear. Orbital soft tissues unremarkable. IMPRESSION: No acute intracranial abnormality. Atrophy, chronic microvascular disease. Critical Value/emergent results were called by telephone at the time of interpretation on 07/29/2016 at 3:05 pm to Dr. Hyacinth Meeker , who verbally acknowledged these results. Electronically  Signed   By: Charlett Nose M.D.   On: 07/29/2016 15:06  Ct Angio Neck W And/or Wo Contrast  Result Date: 07/29/2016 CLINICAL DATA:  Right hand trembling. Right-sided mouth droop. Alzheimer's disease. EXAM: CT ANGIOGRAPHY HEAD AND NECK TECHNIQUE: Multidetector CT imaging of the head and neck was performed using the  standard protocol during bolus administration of intravenous contrast. Multiplanar CT image reconstructions and MIPs were obtained to evaluate the vascular anatomy. Carotid stenosis measurements (when applicable) are obtained utilizing NASCET criteria, using the distal internal carotid diameter as the denominator. CONTRAST:  75 cc Isovue 370 intravenous COMPARISON:  Noncontrast head CT from earlier today. FINDINGS: CTA NECK Aortic arch: 4 vessel branching pattern. Diffuse atherosclerotic calcification. Right carotid system: Scattered predominately calcified atherosclerotic plaque. No flow limiting stenosis or dissection. No plaque ulceration. Occasional artifact. Left carotid system: Intermittent artifact. Scattered atheromatous calcification, greatest at the bifurcation. Stenosis measures up to 25% at the proximal ICA. No evidence of dissection. Shallow excavation of plaque along the lateral proximal ICA. Vertebral arteries:No proximal subclavian stenosis on the right. Right vertebral artery dominance, sole contributor to the basilar. No flow limiting stenosis. No evidence of dissection when allowing for levels of artifact. Skeleton: No acute finding. Other neck: No incidental mass or adenopathy in the neck. CTA HEAD Anterior circulation: Scattered atheromatous calcifications greatest at the bilateral siphons. No major branch occlusion or flow limiting stenosis. Negative for aneurysm. Posterior circulation: Strongly dominant right vertebral artery. Fetal type right PCA. No major branch occlusion or flow limiting stenosis. Venous sinuses: Patent Anatomic variants: Fetal type PCA on the right. Left  posterior communicating artery infundibulum. Delayed phase: Negative for mass or other abnormal enhancement. Atrophy and chronic microvascular disease as previously described. IMPRESSION: 1. No major vessel occlusion or flow limiting stenosis. 2. Proximal left ICA stenosis measuring ~25% with lateral wall plaque ulceration. Electronically Signed   By: Marnee Spring M.D.   On: 07/29/2016 18:08  Mr Brain Wo Contrast  Result Date: 07/30/2016 CLINICAL DATA:  Stroke.  Advanced dementia. EXAM: MRI HEAD WITHOUT CONTRAST MRA HEAD WITHOUT CONTRAST TECHNIQUE: Multiplanar, multiecho pulse sequences of the brain and surrounding structures were obtained without intravenous contrast. Angiographic images of the head were obtained using MRA technique without contrast. COMPARISON:  Head neck CTA from yesterday FINDINGS: MRI HEAD FINDINGS Calvarium and upper cervical spine: Large partly seen cystic structure in the right paramedian maxilla, likely periapical granuloma. No surrounding inflammation. No focal marrow abnormality. Orbits: Negative. Sinuses and Mastoids: Clear. Brain: No acute or remote infarct, hemorrhage, hydrocephalus, or mass lesion. Negative for extra-axial collection. No evidence of large vessel occlusion. Atrophy with secondary ventriculomegaly. Mesial temporal volume loss is moderate to advanced, correlating with history of Alzheimer's disease. Mild to moderate for age chronic microvascular disease with confluent ischemic gliosis in the periventricular white matter. Less extensive microvascular change in the pons. MRA HEAD FINDINGS Dominant right vertebral artery. Left vertebral artery ends in PICA. Fetal type right PCA. No major branch occlusion or flow limiting stenosis. Siphon and MCA luminal irregularity correlating with atheromatous changes seen on comparison CTA. Outpouchings from the left supraclinoid carotid appear chronically shaped and favor 1-2 mm infundibula. IMPRESSION: 1. No acute finding,  including infarct. 2. Atrophy in keeping with history of Alzheimer's disease. 3. Intracranial MRA without acute finding or flow limiting stenosis. Electronically Signed   By: Marnee Spring M.D.   On: 07/30/2016 08:11  Dg Chest Port 1 View  Result Date: 07/29/2016 CLINICAL DATA:  Altered mental status. EXAM: PORTABLE CHEST 1 VIEW COMPARISON:  05/04/2016 FINDINGS: Low lung volumes with bibasilar atelectasis. Heart is normal size. No effusions. No acute bony abnormality. IMPRESSION: Low lung volumes with bibasilar atelectasis. Electronically Signed   By: Charlett Nose M.D.   On: 07/29/2016 15:45  Mr Maxine Glenn Head/brain ZO Cm  Result Date: 07/30/2016  CLINICAL DATA:  Stroke.  Advanced dementia. EXAM: MRI HEAD WITHOUT CONTRAST MRA HEAD WITHOUT CONTRAST TECHNIQUE: Multiplanar, multiecho pulse sequences of the brain and surrounding structures were obtained without intravenous contrast. Angiographic images of the head were obtained using MRA technique without contrast. COMPARISON:  Head neck CTA from yesterday FINDINGS: MRI HEAD FINDINGS Calvarium and upper cervical spine: Large partly seen cystic structure in the right paramedian maxilla, likely periapical granuloma. No surrounding inflammation. No focal marrow abnormality. Orbits: Negative. Sinuses and Mastoids: Clear. Brain: No acute or remote infarct, hemorrhage, hydrocephalus, or mass lesion. Negative for extra-axial collection. No evidence of large vessel occlusion. Atrophy with secondary ventriculomegaly. Mesial temporal volume loss is moderate to advanced, correlating with history of Alzheimer's disease. Mild to moderate for age chronic microvascular disease with confluent ischemic gliosis in the periventricular white matter. Less extensive microvascular change in the pons. MRA HEAD FINDINGS Dominant right vertebral artery. Left vertebral artery ends in PICA. Fetal type right PCA. No major branch occlusion or flow limiting stenosis. Siphon and MCA luminal  irregularity correlating with atheromatous changes seen on comparison CTA. Outpouchings from the left supraclinoid carotid appear chronically shaped and favor 1-2 mm infundibula. IMPRESSION: 1. No acute finding, including infarct. 2. Atrophy in keeping with history of Alzheimer's disease. 3. Intracranial MRA without acute finding or flow limiting stenosis. Electronically Signed   By: Marnee Spring M.D.   On: 07/30/2016 08:11      Assessment and plan per attending neurologist  Felicie Morn PA-C [facility] [phone]  07/30/2016, 11:40 AM   Assessment/Plan: [facility] after multiple episodes of RUE shaking followed by nonverbal state. Concern for seizure. Currently on Keppra with no clinical seizure noted. MRI/MRA shows no etiology for seizure. At this time agree with continuation of Keppra and will evaluate the EEG. IF EEG is negative would recommend continuation of Keppra and evaluate for sedating SE which can be seen with Keppra and elderly.     Neurology Attending Addendum:  This patient was seen, examined, and d/w PA. I have reviewed the note and agree with the findings, assessment and plan as documented with the following additions.   I have reviewed the chart in detail. The patient has advanced Alzheimer's disease and lives in a skilled nursing facility. All this mentioned about her baseline is that she "provides verbal responses." She had a witnessed episode where she had some shaking of her right arm with decreased verbal responsiveness. This prompted transfer to the emergency department for evaluation. She was initially called in as a code stroke and was seen by neurology in the ED. They felt that this was likely seizure rather than stroke and recommended she be transferred to [facility] for further evaluation. She was loaded with Keppra 1000 mg. MRI of the brain was obtained and showed no acute abnormality. The patient is a limited historian. She  answers most questions with "I don't really know" or "I can't do that either. I'm too tired."  Exam: General: This well-developed Caucasian woman resting comfortably in bed. She is initially asleep but arouses easily to voice. She is oriented to self only. She is unable to select the correct month, date, year, or place when provided a list of options. Speech is clear, no dysarthria. Exam is limited by poor effort. Cranial nerves: She appears to have a right exotropia. Extraocular movements appear grossly intact but she will not follow my finger to command. Pupils are equal and reactive. Corneals are intact. There appears to be  some flattening of the left nasolabial fold but facial movement is symmetric and appears intact. Tongue protrudes to midline. Motor: She has normal bulk. Tone is normal but she has mitgehen paratonia. Grips are 5 out of 5 bilaterally. She will not his pain with remainder of confrontational strength testing. No abnormal movements are observed. Sensation: This appears to be intact to light touch though exam is not completely reliable. Coordination: She will not dissipate with finger to nose. She was observed to reach for her covers on her bed and no obvious dysmetria or ataxia was appreciated. DTRs: 2+ in the arms, 2+ at both knees, absent ankle jerks.  Diagnostic studies:  I have personally and independently reviewed the MRI scan of the brain without contrast from 07/30/16. This shows no evidence of acute ischemia. Moderate diffuse atrophy is noted with more prominent atrophy in the medial temporal lobes consistent with a diagnosis of Alzheimer's disease. She has moderate burden of chronic small vessel ischemic change in the bihemispheric white matter.  I have personally and independently reviewed the MRA of the head from 07/30/16. This shows no evidence of intravascular occlusion or significant stenosis.  EEG showed mild to moderate diffuse slowing, no evidence of seizure or  epileptiform abnormality.   Impression: 1. Possible seizure: The episode as reported certainly could represent a complex partial seizure. Seizures are not uncommon in advanced Alzheimer's disease such as in this patient. MRI did not reveal any acute structural abnormality that might explain seizure. She does have a moderate degree of chronic ischemic change, which may also cause seizures in this age group. At this point, EEG did not reveal any active epileptic foci. She is on Keppra, though this may not be the best choice in a demented patient as it can increase agitation and may even precipitate psychosis. Gabapentin is a reasonable agent in this setting, and this can be started at 100 mg daily and slowly titrated to a target dose of 600 mg 3 times daily, initially increasing by 100 mg each week. Given this very slow titration schedule, coverage with phenytoin would be desirable until she is at her target dose with the gabapentin. Keppra will be discontinued with initiation of gabapentin and phenytoin as above. I see that she is on topiramate but am not sure why and would consider stopping this if possible given its adverse effect on cognition as well as the initiation of other AEDs as above. Seizure precaution should be maintained.  2. Alzheimer's dementia: Based upon reported information, she has advanced disease. As an outpatient appears that she is on donepezil and memantine and these can be continued. Her dementia places her at risk for delirium from all causes. Every effort should be made to maintain adequate sleep-wake cycles while in hospital, making sure her room is well lit and active during the daytime and quiet and dark at night. Deliriogenic medications should be avoided, including opiates, benzodiazepines, and anything with strong anticholinergic properties.  3. Cerebrovascular disease: She has chronic small vessel changes on imaging, no evidence of acute stroke. Continue risk factor  modification with tight control of blood pressure, glucose, and lipids. Recommend aspirin 81 mg daily and atorvastatin for secondary prevention.

## 2016-07-30 NOTE — Care Management Obs Status (Signed)
MEDICARE OBSERVATION STATUS NOTIFICATION   Patient Details  Name: [name] MRN: [id number] Date of Birth: 04-Jul-1935   Medicare Observation Status Notification Given:  Yes    Kermit Balo, RN 07/30/2016, 1:55 PM

## 2016-07-30 NOTE — Progress Notes (Signed)
EEG Completed; Results Pending  

## 2016-07-30 NOTE — Progress Notes (Signed)
PT Cancellation Note  Patient Details Name: [name] MRN: [id number] DOB: 1935-10-21   Cancelled Treatment:    Reason Eval/Treat Not Completed: Patient at procedure or test/unavailable. PT to return as able.   Charlynne Pander Hakeen Shipes 07/30/2016, 11:14 AM  Park Liter, SPT (student physical therapist) Acute Rehabilitation Services [phone]

## 2016-07-30 NOTE — Progress Notes (Signed)
Received Pt Via stretcher.Pt is a transfer from [facility]. Patient alert to self and situation with periods of drowsiness. Pt is able to follow short commands. Seizuire precautions in place. Pt on tele box 5 M 04. Will continue to monitor patient for seizure activity

## 2016-07-31 ENCOUNTER — Inpatient Hospital Stay (HOSPITAL_COMMUNITY): Payer: Medicare Other

## 2016-07-31 DIAGNOSIS — G40209 Localization-related (focal) (partial) symptomatic epilepsy and epileptic syndromes with complex partial seizures, not intractable, without status epilepticus: Secondary | ICD-10-CM

## 2016-07-31 LAB — GLUCOSE, CAPILLARY
GLUCOSE-CAPILLARY: 79 mg/dL (ref 65–99)
GLUCOSE-CAPILLARY: 136 mg/dL — AB (ref 65–99)
GLUCOSE-CAPILLARY: 73 mg/dL (ref 65–99)
GLUCOSE-CAPILLARY: 159 mg/dL — AB (ref 65–99)
Glucose-Capillary: 100 mg/dL — ABNORMAL HIGH (ref 65–99)
Glucose-Capillary: 96 mg/dL (ref 65–99)
Glucose-Capillary: 118 mg/dL — ABNORMAL HIGH (ref 65–99)

## 2016-07-31 LAB — URINALYSIS, ROUTINE W REFLEX MICROSCOPIC
BILIRUBIN URINE: NEGATIVE
Glucose, UA: NEGATIVE mg/dL
Ketones, ur: 15 mg/dL — AB
Nitrite: NEGATIVE
PH: 7.5 (ref 5.0–8.0)
Protein, ur: 30 mg/dL — AB
SPECIFIC GRAVITY, URINE: 1.013 (ref 1.005–1.030)

## 2016-07-31 LAB — BASIC METABOLIC PANEL
Anion gap: 6 (ref 5–15)
BUN: 6 mg/dL (ref 6–20)
CO2: 23 mmol/L (ref 22–32)
Calcium: 9.3 mg/dL (ref 8.9–10.3)
Chloride: 111 mmol/L (ref 101–111)
Creatinine, Ser: 0.68 mg/dL (ref 0.44–1.00)
GFR calc Af Amer: >60 mL/min (ref >60)
GLUCOSE: 141 mg/dL — AB (ref 65–99)
POTASSIUM: 2.8 mmol/L — AB (ref 3.5–5.1)
Sodium: 140 mmol/L (ref 135–145)

## 2016-07-31 LAB — PHENYTOIN LEVEL, TOTAL: Phenytoin Lvl: <2.5 ug/mL — ABNORMAL LOW (ref 10.0–20.0)

## 2016-07-31 LAB — URINE MICROSCOPIC-ADD ON

## 2016-07-31 LAB — ALBUMIN: Albumin: 2.9 g/dL — ABNORMAL LOW (ref 3.5–5.0)

## 2016-07-31 MED ORDER — KETOROLAC TROMETHAMINE 15 MG/ML IJ SOLN
15.0000 mg | Freq: Once | INTRAMUSCULAR | Status: AC
  Administered 2016-07-31: 15 mg via INTRAVENOUS
  Filled 2016-07-31: qty 1

## 2016-07-31 MED ORDER — POTASSIUM CHLORIDE CRYS ER 20 MEQ PO TBCR
40.0000 meq | EXTENDED_RELEASE_TABLET | Freq: Three times a day (TID) | ORAL | Status: AC
  Administered 2016-07-31 (×3): 40 meq via ORAL
  Filled 2016-07-31 (×2): qty 2

## 2016-07-31 MED ORDER — ASPIRIN 81 MG PO CHEW
81.0000 mg | CHEWABLE_TABLET | Freq: Every day | ORAL | Status: DC
  Administered 2016-08-01 – 2016-08-02 (×2): 81 mg via ORAL
  Filled 2016-07-31 (×2): qty 1

## 2016-07-31 MED ORDER — PHENYTOIN SODIUM EXTENDED 100 MG PO CAPS
300.0000 mg | ORAL_CAPSULE | Freq: Every day | ORAL | Status: DC
  Administered 2016-07-31 – 2016-08-01 (×2): 300 mg via ORAL
  Filled 2016-07-31 (×2): qty 3

## 2016-07-31 NOTE — Clinical Social Work Placement (Signed)
   CLINICAL SOCIAL WORK PLACEMENT  NOTE  Date:  07/31/2016  Patient Details  Name: [name] MRN: [id number] Date of Birth: [birth year]  Clinical Social Work is seeking post-discharge placement for this patient at the Skilled  Nursing Facility level of care (*CSW will initial, date and re-position this form in  chart as items are completed):  Yes   Patient/family provided with [facility] Clinical Social Work Department's list of facilities offering this level of care within the geographic area requested by the patient (or if unable, by the patient's family).  Yes   Patient/family informed of their freedom to choose among providers that offer the needed level of care, that participate in Medicare, Medicaid or managed care program needed by the patient, have an available bed and are willing to accept the patient.  Yes   Patient/family informed of [facility]'s ownership interest in [facility] and [facility], as well as of the fact that they are under no obligation to receive care at these facilities.  PASRR submitted to EDS on       PASRR number received on       Existing PASRR number confirmed on 07/31/16     FL2 transmitted to all facilities in geographic area requested by pt/family on 07/31/16     FL2 transmitted to all facilities within larger geographic area on       Patient informed that his/her managed care company has contracts with or will negotiate with certain facilities, including the following:            Patient/family informed of bed offers received.  Patient chooses bed at       Physician recommends and patient chooses bed at      Patient to be transferred to   on  .  Patient to be transferred to facility by       Patient family notified on   of transfer.  Name of family member notified:        PHYSICIAN       Additional Comment:    _______________________________________________ Dede Query, LCSW 07/31/2016, 3:39 PM

## 2016-07-31 NOTE — Progress Notes (Signed)
Speech Language Pathology Treatment: Dysphagia  Patient Details Name: [name] MRN: [id number] DOB: 1935/12/30 Today's Date: 07/31/2016 Time: 8372-9021 SLP Time Calculation (min) (ACUTE ONLY): 9 min  Assessment / Plan / Recommendation Clinical Impression  Increased alertness today and verbalizing. Pt declined all po's except for one sip orange juice. Wet vocal quality noted possibly indicative of airway compromise. Pt states she has no appetite; SLP encouraged intake without success. Currently on Dys 1 (puree) and thin liquids. Would like to increase to solids once able to observe and ensure safety and efficiency. Will continue to follow.     HPI HPI: [name] a 81 y.o.femalewith medical history significant for advanced dementia, hypertension, type 2 diabetes mellitus, hypothyroidism, and anxiety who presents the emergency department from her nursing facility for evaluation of altered mental status. Chest x-rays notable for low volumes with bibasilar atelectasis. Noncontrast head CT was obtained and negative for acute intracranial abnormality. Per chart it was suspected that the patient's presentation was secondary to seizure rather than CVA, hypokalemia.      SLP Plan  Continue with current plan of care     Recommendations  Diet recommendations: Dysphagia 1 (puree);Thin liquid Liquids provided via: Cup;Straw Medication Administration: Crushed with puree Supervision: Patient able to self feed;Full supervision/cueing for compensatory strategies;Staff to assist with self feeding Compensations: Minimize environmental distractions;Slow rate;Small sips/bites Postural Changes and/or Swallow Maneuvers: Seated upright 90 degrees             Oral Care Recommendations: Oral care BID Follow up Recommendations: Skilled Nursing facility Plan: Continue with current plan of care                    [name] 07/31/2016, 9:24 AM  [name] [name].Ed  [employer] [phone]

## 2016-07-31 NOTE — Progress Notes (Signed)
[place] TEAM 1 - Stepdown/ICU TEAM  [name]  FMB:[id number] DOB: 05-10-1935 DOA: 07/29/2016 PCP: Galvin Proffer, MD    Brief Narrative:  81 y.o. F with history of advanced dementia, hypertension, type 2 diabetes mellitus, hypothyroidism, and anxiety who presented to the ED from her nursing facility for altered mental status. Patient was reportedly in her usual state when she was noted to develop acute onset of right arm shaking, followed by "drawing up of her right side," and lack of verbal responsiveness. Patient has advanced dementia, but reportedly provides verbal responses at her baseline. She would not speak to staff following this episode at the nursing facility and she was transported to the ED for further evaluation. She had not exhibited similar behavior previously, had not voiced any recent complaints, and there had been no recent change in her medications.  In the ED UDS was negative, and urinalysis was unremarkable. Noncontrast head CT was negative for acute intracranial abnormality. She was loaded with 1 g of Keppra intravenously.  Subjective: The patient is sleeping soundly.  She does not awaken to my exam.  She does not appear to be in any acute respiratory distress and there is no evidence of pain at the present time.  Assessment & Plan:  Acute altered mental status - suspected seizure CT head negative for acute abnormality - CTA head and neck without major vessel occlusion/stenosis - MRI w/o acute findings - suspected seizure activity - cleared for dysphagia 1 diet with thin liquids per SLP - EEG without active epileptic foci - neurontin being titrated as an anti-epileptic drug with phenytoin to be used in the interim  Hypokalemia Persistent - supplement and follow - magnesium normal - recheck in a.m.  Hypothyroidism TSH within goal range  Alzheimer's dementia Resume usual home medical therapy  HTN Blood pressure currently reasonably controlled  DM2 CBG  currently well-controlled  DVT prophylaxis: Lovenox Code Status: No CODE BLUE Family Communication: no family present at time of exam  Disposition Plan: possible return to SNF in 48hrs   Consultants:  Neurology  Procedures: EEG  Antimicrobials:  None  Objective: Blood pressure (!) 134/56, pulse 60, temperature 98.1 F (36.7 C), temperature source Oral, resp. rate 18, weight 58.4 kg (128 lb 12.8 oz), SpO2 100 %.  Intake/Output Summary (Last 24 hours) at 07/31/16 1535 Last data filed at 07/31/16 1300  Gross per 24 hour  Intake              480 ml  Output                0 ml  Net              480 ml   Filed Weights   07/29/16 1428 07/31/16 0554  Weight: 60.8 kg (134 lb) 58.4 kg (128 lb 12.8 oz)    Examination: General: No acute respiratory distress Lungs: Clear to auscultation bilaterally without wheezes or crackles Cardiovascular: Regular rate and rhythm without murmur gallop or rub normal S1 and S2 Abdomen: Nontender, nondistended, soft, bowel sounds positive, no rebound, no ascites, no appreciable mass Extremities: No significant cyanosis, clubbing, or edema bilateral lower extremities  CBC:  Recent Labs Lab 07/29/16 1449 07/29/16 1454 07/29/16 2202  WBC 7.4  --   --   NEUTROABS 4.3  --   --   HGB 12.3 13.3 11.9*  HCT 38.0 39.0 35.0*  MCV 86.8  --   --   PLT 167  --   --  Basic Metabolic Panel:  Recent Labs Lab 07/29/16 1449 07/29/16 1454 07/29/16 2202 07/31/16 0207  NA 144 149* 149* 140  K 2.6* 2.6* 3.1* 2.8*  CL 111 108 112* 111  CO2 26  --   --  23  GLUCOSE 121* 116* 111* 141*  BUN CREATININE 0.82 0.80 0.80 0.68  CALCIUM 9.8  --   --  9.3  MG 2.1  --   --   --    GFR: Estimated Creatinine Clearance: 46.4 mL/min (by C-G formula based on SCr of 0.8 mg/dL).  Liver Function Tests:  Recent Labs Lab 07/29/16 1449 07/31/16 0207  AST 17  --   ALT 13*  --   ALKPHOS 88  --   BILITOT 0.3  --   PROT 7.3  --   ALBUMIN 3.7 2.9*     Coagulation Profile:  Recent Labs Lab 07/29/16 1449  INR 1.06    HbA1C: Hgb A1c MFr Bld  Date/Time Value Ref Range Status  12/19/2010 11:47 PM 6.6 (H) <5.7 % Final    Comment:    See lab report for associated comment(s)  08/16/2010 08:30 PM (H) <5.7 % Final   5.8 (NOTE)                                                                       According to the ADA Clinical Practice Recommendations for 2011, when HbA1c is used as a screening test:   >=6.5%   Diagnostic of Diabetes Mellitus           (if abnormal result  is confirmed)  5.7-6.4%   Increased risk of developing Diabetes Mellitus  References:Diagnosis and Classification of Diabetes Mellitus,Diabetes Care,2011,34(Suppl 1):S62-S69 and Standards of Medical Care in         Diabetes - 2011,Diabetes Care,2011,34  (Suppl 1):S11-S61.    CBG:  Recent Labs Lab 07/30/16 2334 07/31/16 0345 07/31/16 0651 07/31/16 0749 07/31/16 1134  GLUCAP 174* 79 100* 96 136*    Recent Results (from the past 240 hour(s))  MRSA PCR Screening     Status: None   Collection Time: 07/29/16 11:45 PM  Result Value Ref Range Status   MRSA by PCR NEGATIVE NEGATIVE Final    Comment:        The GeneXpert MRSA Assay (FDA approved for NASAL specimens only), is one component of a comprehensive MRSA colonization surveillance program. It is not intended to diagnose MRSA infection nor to guide or monitor treatment for MRSA infections.      Scheduled Meds: . amLODipine  10 mg Oral Daily  . aspirin  300 mg Rectal Daily  . cholecalciferol  1,000 Units Oral Daily  . citalopram  20 mg Oral Daily  . memantine  28 mg Oral Daily   And  . donepezil  10 mg Oral Daily  . enoxaparin (LOVENOX) injection  40 mg Subcutaneous Q24H  . gabapentin  100 mg Oral QHS  . insulin aspart  0-9 Units Subcutaneous Q4H  . levothyroxine  25 mcg Oral QAC breakfast  . lisinopril  40 mg Oral Daily  . magnesium oxide  400 mg Oral BID  . metoprolol tartrate  25 mg Oral  BID  . pantoprazole  40  mg Oral Daily  . phenytoin  300 mg Oral QHS  . polyethylene glycol  17 g Oral Daily  . polyvinyl alcohol  1 drop Both Eyes BID  . pravastatin  40 mg Oral q1800  . topiramate  50 mg Oral BID     LOS: 1 day    Lonia Blood, MD [facility] Office  [phone] Pager - Text Page per Amion as per below:  On-Call/Text Page:      [email or web address]      password [email or web address]  If 7PM-7AM, please contact night-coverage [email or web address] Password [email or web address] 07/31/2016, 3:35 PM

## 2016-07-31 NOTE — NC FL2 (Signed)
[place] MEDICAID FL2 LEVEL OF CARE SCREENING TOOL     IDENTIFICATION  Patient Name: [name] Birthdate: [birth year] Sex: female Admission Date (Current Location): 07/29/2016  [facility] and [place] Number:  Producer, television/film/video and Address:  [place]. [facility], [address], [place], [place] [address]      Provider Number: [id number]  Attending Physician Name and Address:  Lonia Blood, MD  Relative Name and Phone Number:       Current Level of Care: Hospital Recommended Level of Care: Skilled Nursing Facility Prior Approval Number:    Date Approved/Denied:   PASRR Number: [id number]  Discharge Plan: SNF    Current Diagnoses: Patient Active Problem List   Diagnosis Date Noted  . Partial symptomatic epilepsy with complex partial seizures, not intractable, without status epilepticus (HCC)   . Altered mental status 07/29/2016  . Hypokalemia 07/29/2016  . Displaced fracture of right femoral neck (HCC) 05/07/2016  . Hip fracture (HCC) 05/04/2016  . Closed right hip fracture (HCC) 05/04/2016  . Type 2 diabetes mellitus with hyperglycemia (HCC) 05/04/2016  . Dementia 05/04/2016  . Closed fracture of part of upper end of humerus 01/03/2015  . Hypothyroidism 12/19/2010  . DIABETES MELLITUS, TYPE II 12/19/2010  . DEMENTIA 12/19/2010  . ANXIETY 12/19/2010  . Essential hypertension 12/19/2010  . DIZZINESS 12/19/2010  . FATIGUE 12/19/2010  . HEADACHE 12/19/2010    Orientation RESPIRATION BLADDER Height & Weight     Self  Normal Incontinent Weight: 128 lb 12.8 oz (58.4 kg) Height:     BEHAVIORAL SYMPTOMS/MOOD NEUROLOGICAL BOWEL NUTRITION STATUS      Incontinent Diet (DYS 1, Thin Liquids)  AMBULATORY STATUS COMMUNICATION OF NEEDS Skin   Limited Assist Verbally Normal                       Personal Care Assistance Level of Assistance  Bathing, Feeding, Dressing Bathing Assistance: Limited assistance Feeding assistance: Limited  assistance Dressing Assistance: Limited assistance     Functional Limitations Info  Sight, Hearing, Speech Sight Info: Adequate Hearing Info: Adequate Speech Info: Adequate    SPECIAL CARE FACTORS FREQUENCY  PT (By licensed PT)     PT Frequency: 5              Contractures Contractures Info: Not present    Additional Factors Info  Code Status, Allergies Code Status Info: Full Code Allergies Info: Allergies: Penicillins, Promethazine Hcl, Sulfonamide Derivatives           Current Medications (07/31/2016):  This is the current hospital active medication list Current Facility-Administered Medications  Medication Dose Route Frequency Provider Last Rate Last Dose  . acetaminophen (TYLENOL) tablet 1,000 mg  1,000 mg Oral Q6H PRN Briscoe Deutscher, MD   1,000 mg at 07/30/16 2123  . amLODipine (NORVASC) tablet 10 mg  10 mg Oral Daily Briscoe Deutscher, MD   10 mg at 07/31/16 1040  . aspirin suppository 300 mg  300 mg Rectal Daily Lavone Neri Opyd, MD   300 mg at 07/31/16 1040  . cholecalciferol (VITAMIN D) tablet 1,000 Units  1,000 Units Oral Daily Briscoe Deutscher, MD   1,000 Units at 07/31/16 1039  . citalopram (CELEXA) tablet 20 mg  20 mg Oral Daily Briscoe Deutscher, MD   20 mg at 07/31/16 1041  . memantine (NAMENDA XR) 24 hr capsule 28 mg  28 mg Oral Daily Briscoe Deutscher, MD   28 mg at  07/31/16 1040   And  . donepezil (ARICEPT) tablet 10 mg  10 mg Oral Daily Briscoe Deutscher, MD   10 mg at 07/31/16 1039  . enoxaparin (LOVENOX) injection 40 mg  40 mg Subcutaneous Q24H Briscoe Deutscher, MD   40 mg at 07/31/16 1040  . gabapentin (NEURONTIN) capsule 100 mg  100 mg Oral QHS Versie Starks, MD   100 mg at 07/30/16 2123  . hydrALAZINE (APRESOLINE) injection 10 mg  10 mg Intravenous Q4H PRN Lavone Neri Opyd, MD      . insulin aspart (novoLOG) injection 0-9 Units  0-9 Units Subcutaneous Q4H Briscoe Deutscher, MD   1 Units at 07/31/16 1148  . levothyroxine (SYNTHROID, LEVOTHROID) tablet 25 mcg  25 mcg  Oral QAC breakfast Briscoe Deutscher, MD   25 mcg at 07/31/16 1610  . lisinopril (PRINIVIL,ZESTRIL) tablet 40 mg  40 mg Oral Daily Briscoe Deutscher, MD   40 mg at 07/31/16 1038  . magnesium oxide (MAG-OX) tablet 400 mg  400 mg Oral BID Briscoe Deutscher, MD   400 mg at 07/31/16 1039  . metoprolol tartrate (LOPRESSOR) tablet 25 mg  25 mg Oral BID Briscoe Deutscher, MD   25 mg at 07/31/16 1039  . pantoprazole (PROTONIX) EC tablet 40 mg  40 mg Oral Daily Briscoe Deutscher, MD   40 mg at 07/31/16 1042  . phenytoin (DILANTIN) ER capsule 300 mg  300 mg Oral QHS Ulice Dash, PA-C      . polyethylene glycol (MIRALAX / GLYCOLAX) packet 17 g  17 g Oral Daily Briscoe Deutscher, MD   17 g at 07/31/16 1042  . polyvinyl alcohol (LIQUIFILM TEARS) 1.4 % ophthalmic solution 1 drop  1 drop Both Eyes BID Briscoe Deutscher, MD   1 drop at 07/31/16 1041  . pravastatin (PRAVACHOL) tablet 40 mg  40 mg Oral q1800 Briscoe Deutscher, MD   40 mg at 07/30/16 1828  . senna (SENOKOT) tablet 8.6 mg  1 tablet Oral Daily PRN Briscoe Deutscher, MD      . topiramate (TOPAMAX) tablet 50 mg  50 mg Oral BID Briscoe Deutscher, MD   50 mg at 07/31/16 1040     Discharge Medications: Please see discharge summary for a list of discharge medications.  Relevant Imaging Results:  Relevant Lab Results:   Additional Information SSN:  [id number]  Dede Query, LCSW

## 2016-07-31 NOTE — Clinical Social Work Note (Signed)
Clinical Social Work Assessment  Patient Details  Name: NAJWA WOLBECK MRN: [id number] Date of Birth: Dec 06, 1935  Date of referral:  07/31/16               Reason for consult:  Facility Placement                Permission sought to share information with:    Permission granted to share information::  Yes, Verbal Permission Granted  Name::     Laure Kidney  Relationship::  daughter  Contact Information:  [phone]  Housing/Transportation Living arrangements for the past 2 months:  Assisted Living Facility, Skilled Nursing Facility Source of Information:  Adult Children Patient Interpreter Needed:  None Criminal Activity/Legal Involvement Pertinent to Current Situation/Hospitalization:  No - Comment as needed Significant Relationships:  Adult Children, Spouse Lives with:  Facility Resident Do you feel safe going back to the place where you live?  Yes Need for family participation in patient care:  Yes (Comment)  Care giving concerns:  No care giving concerns identified.   Social Worker assessment / plan:  CSW spoke with pt's daughter to address consult for pt as she was admitted from [facility]. Pt has a history of dementia. Pt's daughter and pt's husband share POA as pt's husband is home bound. CSW introduced herself and explained role of social work. CSW also explained the process of discharging to SNF with traditional medicare. Pt's daughter shared that pt had a hip fracture in May of 2017 and went to SNF. Pt's daughter shared that pt is now in a wheelchair, however was walking at rehab. Pt's daughter also shared that pt does not recognize her and has had poor PO intake. Pt's daughter is open to a SNF search. CSW will follow up with pt's husband to address co pays, as pt is in her co pay days if she returns to SNF.   CSW spoke with MD and inquired about a Palliative Care consult. CSW will initiate a SNF search and follow up with bed offers. CSW will continue to  follow.   Employment status:  Retired Health and safety inspector:  Medicare PT Recommendations:  Skilled Nursing Facility Information / Referral to community resources:  Skilled Nursing Facility  Patient/Family's Response to care:  Pt' daughter was Adult nurse of CSW support.   Patient/Family's Understanding of and Emotional Response to Diagnosis, Current Treatment, and Prognosis:  Pt's daughter would like for discharge plan to be discussed with pt's husband as well.   Emotional Assessment Appearance:  Appears stated age Attitude/Demeanor/Rapport:  Other Affect (typically observed):  Other Orientation:  Oriented to Self Alcohol / Substance use:  Never Used Psych involvement (Current and /or in the community):  No (Comment)  Discharge Needs  Concerns to be addressed:  Adjustment to Illness Readmission within the last 30 days:  No Current discharge risk:  Cognitively Impaired Barriers to Discharge:  Continued Medical Work up   [employer], LCSW 07/31/2016, 4:50 PM

## 2016-07-31 NOTE — Evaluation (Signed)
Physical Therapy Evaluation Patient Details Name: [name] MRN: [id number] DOB: 1935-04-25 Today's Date: 07/31/2016   History of Present Illness  81 yo female admitted with R facial droop and lack of verbal responsiveness MRI (neg) workup for seizures underway  PMH: DM HTN ICH dementia   Clinical Impression  Pt admitted with the above and is from [facility]. Per case management, per facility, pt is wheelchair bound at baseline. Pt with limited verbal response to PT during eval. Pt dependent to EOB. Appears as pt is at baseline. Pt appropriate for return to facility once medically ready.     Follow Up Recommendations SNF;Supervision/Assistance - 24 hour    Equipment Recommendations  None recommended by PT    Recommendations for Other Services       Precautions / Restrictions Precautions Precautions: Fall Restrictions Weight Bearing Restrictions: No      Mobility  Bed Mobility Overal bed mobility: Needs Assistance;+2 for physical assistance;+ 2 for safety/equipment Bed Mobility: Supine to Sit     Supine to sit: Total assist;+2 for physical assistance;+2 for safety/equipment     General bed mobility comments: Pt required +1 for trunk mobility and +1 for LE with bed pad under hips.  Transfers                 General transfer comment: did not attempt  Ambulation/Gait             General Gait Details: did not attempt  Stairs            Wheelchair Mobility    Modified Rankin (Stroke Patients Only) Modified Rankin (Stroke Patients Only) Pre-Morbid Rankin Score: Severe disability Modified Rankin: Severe disability     Balance Overall balance assessment: Needs assistance Sitting-balance support: No upper extremity supported;Feet unsupported Sitting balance-Leahy Scale: Zero Sitting balance - Comments: Total assist to maintain sitting balance Postural control: Right lateral lean                                   Pertinent Vitals/Pain Pain Assessment: Faces Faces Pain Scale: Hurts even more Pain Location: L mid traps, rhomboids Pain Descriptors / Indicators: Moaning;Tightness Pain Intervention(s): Monitored during session    Home Living Family/patient expects to be discharged to:: Skilled nursing facility                 Additional Comments: Case management confirms pt from [facility]     Prior Function Level of Independence: Needs assistance   Gait / Transfers Assistance Needed: wheelchair bound at baselne  ADL's / Homemaking Assistance Needed: dependent        Hand Dominance   Dominant Hand:  (unknown)    Extremity/Trunk Assessment   Upper Extremity Assessment: Difficult to assess due to impaired cognition (Initated bil grip strength, no other voluntary movement )           Lower Extremity Assessment: Difficult to assess due to impaired cognition (Able to wiggle L toes, no other voluntary movment)      Cervical / Trunk Assessment: Kyphotic  Communication   Communication: Expressive difficulties (Muffled, unable to understand)  Cognition Arousal/Alertness: Lethargic Behavior During Therapy: Flat affect Overall Cognitive Status: History of cognitive impairments - at baseline                      General Comments General comments (  skin integrity, edema, etc.): Pt alert to person at beginning of treatment. Minimal response to toe squeeze bilaterally.  No clear verbal communication at end of session.    Exercises        Assessment/Plan    PT Assessment Patient needs continued PT services  PT Diagnosis Altered mental status;Generalized weakness   PT Problem List Decreased cognition;Decreased coordination;Decreased mobility;Decreased balance;Decreased activity tolerance;Decreased range of motion;Decreased strength;Decreased safety awareness;Pain;Decreased skin integrity  PT Treatment Interventions Functional  mobility training;Therapeutic activities;Therapeutic exercise;Balance training;Neuromuscular re-education;Cognitive remediation;Wheelchair mobility training;Patient/family education   PT Goals (Current goals can be found in the Care Plan section) Acute Rehab PT Goals Patient Stated Goal: none stated PT Goal Formulation: Patient unable to participate in goal setting Time For Goal Achievement: 08/07/16 Potential to Achieve Goals: Fair    Frequency Min 2X/week   Barriers to discharge        Co-evaluation               End of Session   Activity Tolerance: Treatment limited secondary to medical complications (Comment);Patient limited by lethargy Patient left: in bed;with call bell/phone within reach;with nursing/sitter in room Nurse Communication: Mobility status         Time: 0865-7846 PT Time Calculation (min) (ACUTE ONLY): 22 min   Charges:   PT Evaluation $PT Eval Moderate Complexity: 1 Procedure     PT G CodesDreama Saa 10-Aug-2016, 11:33 AM  Park Liter, SPT (student physical therapist) Acute Rehabilitation Services [phone]

## 2016-07-31 NOTE — Progress Notes (Signed)
Subjective: Patient remains very anhedonic and has no desire to take part in exam or talk. She states she "just does not feel good" but will not point to any specifics. When asked to take part in exam she just states "don't be silly". She makes no eye contact and is staring at the wall.  Objective: Current vital signs: BP (!) 143/57 (BP Location: Right Arm)   Pulse (!) 54   Temp 98.1 F (36.7 C) (Oral)   Resp 16   Wt 58.4 kg (128 lb 12.8 oz)   SpO2 100%   BMI 25.15 kg/m  Vital signs in last 24 hours: Temp:  [97.9 F (36.6 C)-98.9 F (37.2 C)] 98.1 F (36.7 C) (08/01 0659) Pulse Rate:  [54-70] 54 (08/01 0659) Resp:  [16-18] 16 (08/01 0151) BP: (123-146)/(53-103) 143/57 (08/01 0659) SpO2:  [96 %-100 %] 100 % (08/01 0659) Weight:  [58.4 kg (128 lb 12.8 oz)] 58.4 kg (128 lb 12.8 oz) (08/01 0554)  Intake/Output from previous day: No intake/output data recorded. Intake/Output this shift: No intake/output data recorded. Nutritional status: DIET - DYS 1 Room service appropriate? Yes; Fluid consistency: Thin   Neurologic Exam: Mental Status: anhedonic with depressed affect. Speech fluent without evidence of aphasia.   Cranial Nerves: II:  Visual fields grossly normal, pupils equal, round, reactive to light and accommodation III,IV, VI: ptosis not present,doll's intact but will not look horizontally to my finger.  V,VII: face has a very mild left NL fold flattening.  facial light touch sensation intact bilaterally VIII: hearing normal bilaterally IX,X: uvula rises symmetrically XI: bilateral shoulder shrug XII: midline tongue extension Motor: Moving all extremities antigravity with no increased tone or rigidity but only moves when noxious stimuli given as she does not want to take part in exam.  Sensory: Pinprick and light touch intact throughout, bilaterally Deep Tendon Reflexes: 2+ bilateral ARMS and KJ but no AJ Plantars: Right: downgoing                                                               Left: downgoing Cerebellar: Would not take part in exam Gait: not tested     Lab Results: Basic Metabolic Panel:  Recent Labs Lab 07/29/16 1449 07/29/16 1454 07/29/16 2202 07/31/16 0207  NA 144 149* 149* 140  K 2.6* 2.6* 3.1* 2.8*  CL 111 108 112* 111  CO2 26  --   --  23  GLUCOSE 121* 116* 111* 141*  BUN 12 10 6 6   CREATININE 0.82 0.80 0.80 0.68  CALCIUM 9.8  --   --  9.3  MG 2.1  --   --   --     Liver Function Tests:  Recent Labs Lab 07/29/16 1449 07/31/16 0207  AST 17  --   ALT 13*  --   ALKPHOS 88  --   BILITOT 0.3  --   PROT 7.3  --   ALBUMIN 3.7 2.9*   No results for input(s): LIPASE, AMYLASE in the last 168 hours. No results for input(s): AMMONIA in the last 168 hours.  CBC:  Recent Labs Lab 07/29/16 1449 07/29/16 1454 07/29/16 2202  WBC 7.4  --   --   NEUTROABS 4.3  --   --   HGB 12.3 13.3 11.9*  HCT  38.0 39.0 35.0*  MCV 86.8  --   --   PLT 167  --   --     Cardiac Enzymes: No results for input(s): CKTOTAL, CKMB, CKMBINDEX, TROPONINI in the last 168 hours.  Lipid Panel: No results for input(s): CHOL, TRIG, HDL, CHOLHDL, VLDL, LDLCALC in the last 168 hours.  CBG:  Recent Labs Lab 07/30/16 1826 07/30/16 2115 07/30/16 2334 07/31/16 0345 07/31/16 0651  GLUCAP 137* 116* 174* 79 100*    Microbiology: Results for orders placed or performed during the hospital encounter of 07/29/16  MRSA PCR Screening     Status: None   Collection Time: 07/29/16 11:45 PM  Result Value Ref Range Status   MRSA by PCR NEGATIVE NEGATIVE Final    Comment:        The GeneXpert MRSA Assay (FDA approved for NASAL specimens only), is one component of a comprehensive MRSA colonization surveillance program. It is not intended to diagnose MRSA infection nor to guide or monitor treatment for MRSA infections.     Coagulation Studies:  Recent Labs  07/29/16 1449  LABPROT 13.9  INR 1.06    Imaging: Ct Angio Head W Or Wo  Contrast  Result Date: 07/29/2016 CLINICAL DATA:  Right hand trembling. Right-sided mouth droop. Alzheimer's disease. EXAM: CT ANGIOGRAPHY HEAD AND NECK TECHNIQUE: Multidetector CT imaging of the head and neck was performed using the standard protocol during bolus administration of intravenous contrast. Multiplanar CT image reconstructions and MIPs were obtained to evaluate the vascular anatomy. Carotid stenosis measurements (when applicable) are obtained utilizing NASCET criteria, using the distal internal carotid diameter as the denominator. CONTRAST:  75 cc Isovue 370 intravenous COMPARISON:  Noncontrast head CT from earlier today. FINDINGS: CTA NECK Aortic arch: 4 vessel branching pattern. Diffuse atherosclerotic calcification. Right carotid system: Scattered predominately calcified atherosclerotic plaque. No flow limiting stenosis or dissection. No plaque ulceration. Occasional artifact. Left carotid system: Intermittent artifact. Scattered atheromatous calcification, greatest at the bifurcation. Stenosis measures up to 25% at the proximal ICA. No evidence of dissection. Shallow excavation of plaque along the lateral proximal ICA. Vertebral arteries:No proximal subclavian stenosis on the right. Right vertebral artery dominance, sole contributor to the basilar. No flow limiting stenosis. No evidence of dissection when allowing for levels of artifact. Skeleton: No acute finding. Other neck: No incidental mass or adenopathy in the neck. CTA HEAD Anterior circulation: Scattered atheromatous calcifications greatest at the bilateral siphons. No major branch occlusion or flow limiting stenosis. Negative for aneurysm. Posterior circulation: Strongly dominant right vertebral artery. Fetal type right PCA. No major branch occlusion or flow limiting stenosis. Venous sinuses: Patent Anatomic variants: Fetal type PCA on the right. Left posterior communicating artery infundibulum. Delayed phase: Negative for mass or other  abnormal enhancement. Atrophy and chronic microvascular disease as previously described. IMPRESSION: 1. No major vessel occlusion or flow limiting stenosis. 2. Proximal left ICA stenosis measuring ~25% with lateral wall plaque ulceration. Electronically Signed   By: Marnee Spring M.D.   On: 07/29/2016 18:08  Ct Head Wo Contrast  Result Date: 07/29/2016 CLINICAL DATA:  Right facial droop today.  Code stroke EXAM: CT HEAD WITHOUT CONTRAST TECHNIQUE: Contiguous axial images were obtained from the base of the skull through the vertex without intravenous contrast. COMPARISON:  04/24/2016 FINDINGS: There is atrophy and chronic small vessel disease changes. No acute intracranial abnormality. Specifically, no hemorrhage, hydrocephalus, mass lesion, acute infarction, or significant intracranial injury. No acute calvarial abnormality. Visualized paranasal sinuses and mastoids clear. Orbital soft tissues  unremarkable. IMPRESSION: No acute intracranial abnormality. Atrophy, chronic microvascular disease. Critical Value/emergent results were called by telephone at the time of interpretation on 07/29/2016 at 3:05 pm to Dr. Hyacinth Meeker , who verbally acknowledged these results. Electronically Signed   By: Charlett Nose M.D.   On: 07/29/2016 15:06  Ct Angio Neck W And/or Wo Contrast  Result Date: 07/29/2016 CLINICAL DATA:  Right hand trembling. Right-sided mouth droop. Alzheimer's disease. EXAM: CT ANGIOGRAPHY HEAD AND NECK TECHNIQUE: Multidetector CT imaging of the head and neck was performed using the standard protocol during bolus administration of intravenous contrast. Multiplanar CT image reconstructions and MIPs were obtained to evaluate the vascular anatomy. Carotid stenosis measurements (when applicable) are obtained utilizing NASCET criteria, using the distal internal carotid diameter as the denominator. CONTRAST:  75 cc Isovue 370 intravenous COMPARISON:  Noncontrast head CT from earlier today. FINDINGS: CTA NECK  Aortic arch: 4 vessel branching pattern. Diffuse atherosclerotic calcification. Right carotid system: Scattered predominately calcified atherosclerotic plaque. No flow limiting stenosis or dissection. No plaque ulceration. Occasional artifact. Left carotid system: Intermittent artifact. Scattered atheromatous calcification, greatest at the bifurcation. Stenosis measures up to 25% at the proximal ICA. No evidence of dissection. Shallow excavation of plaque along the lateral proximal ICA. Vertebral arteries:No proximal subclavian stenosis on the right. Right vertebral artery dominance, sole contributor to the basilar. No flow limiting stenosis. No evidence of dissection when allowing for levels of artifact. Skeleton: No acute finding. Other neck: No incidental mass or adenopathy in the neck. CTA HEAD Anterior circulation: Scattered atheromatous calcifications greatest at the bilateral siphons. No major branch occlusion or flow limiting stenosis. Negative for aneurysm. Posterior circulation: Strongly dominant right vertebral artery. Fetal type right PCA. No major branch occlusion or flow limiting stenosis. Venous sinuses: Patent Anatomic variants: Fetal type PCA on the right. Left posterior communicating artery infundibulum. Delayed phase: Negative for mass or other abnormal enhancement. Atrophy and chronic microvascular disease as previously described. IMPRESSION: 1. No major vessel occlusion or flow limiting stenosis. 2. Proximal left ICA stenosis measuring ~25% with lateral wall plaque ulceration. Electronically Signed   By: Marnee Spring M.D.   On: 07/29/2016 18:08  Mr Brain Wo Contrast  Result Date: 07/30/2016 CLINICAL DATA:  Stroke.  Advanced dementia. EXAM: MRI HEAD WITHOUT CONTRAST MRA HEAD WITHOUT CONTRAST TECHNIQUE: Multiplanar, multiecho pulse sequences of the brain and surrounding structures were obtained without intravenous contrast. Angiographic images of the head were obtained using MRA technique  without contrast. COMPARISON:  Head neck CTA from yesterday FINDINGS: MRI HEAD FINDINGS Calvarium and upper cervical spine: Large partly seen cystic structure in the right paramedian maxilla, likely periapical granuloma. No surrounding inflammation. No focal marrow abnormality. Orbits: Negative. Sinuses and Mastoids: Clear. Brain: No acute or remote infarct, hemorrhage, hydrocephalus, or mass lesion. Negative for extra-axial collection. No evidence of large vessel occlusion. Atrophy with secondary ventriculomegaly. Mesial temporal volume loss is moderate to advanced, correlating with history of Alzheimer's disease. Mild to moderate for age chronic microvascular disease with confluent ischemic gliosis in the periventricular white matter. Less extensive microvascular change in the pons. MRA HEAD FINDINGS Dominant right vertebral artery. Left vertebral artery ends in PICA. Fetal type right PCA. No major branch occlusion or flow limiting stenosis. Siphon and MCA luminal irregularity correlating with atheromatous changes seen on comparison CTA. Outpouchings from the left supraclinoid carotid appear chronically shaped and favor 1-2 mm infundibula. IMPRESSION: 1. No acute finding, including infarct. 2. Atrophy in keeping with history of Alzheimer's disease. 3. Intracranial MRA without  acute finding or flow limiting stenosis. Electronically Signed   By: Marnee Spring M.D.   On: 07/30/2016 08:11  Dg Chest Port 1 View  Result Date: 07/29/2016 CLINICAL DATA:  Altered mental status. EXAM: PORTABLE CHEST 1 VIEW COMPARISON:  05/04/2016 FINDINGS: Low lung volumes with bibasilar atelectasis. Heart is normal size. No effusions. No acute bony abnormality. IMPRESSION: Low lung volumes with bibasilar atelectasis. Electronically Signed   By: Charlett Nose M.D.   On: 07/29/2016 15:45  Mr Maxine Glenn Head/brain WU Cm  Result Date: 07/30/2016 CLINICAL DATA:  Stroke.  Advanced dementia. EXAM: MRI HEAD WITHOUT CONTRAST MRA HEAD WITHOUT  CONTRAST TECHNIQUE: Multiplanar, multiecho pulse sequences of the brain and surrounding structures were obtained without intravenous contrast. Angiographic images of the head were obtained using MRA technique without contrast. COMPARISON:  Head neck CTA from yesterday FINDINGS: MRI HEAD FINDINGS Calvarium and upper cervical spine: Large partly seen cystic structure in the right paramedian maxilla, likely periapical granuloma. No surrounding inflammation. No focal marrow abnormality. Orbits: Negative. Sinuses and Mastoids: Clear. Brain: No acute or remote infarct, hemorrhage, hydrocephalus, or mass lesion. Negative for extra-axial collection. No evidence of large vessel occlusion. Atrophy with secondary ventriculomegaly. Mesial temporal volume loss is moderate to advanced, correlating with history of Alzheimer's disease. Mild to moderate for age chronic microvascular disease with confluent ischemic gliosis in the periventricular white matter. Less extensive microvascular change in the pons. MRA HEAD FINDINGS Dominant right vertebral artery. Left vertebral artery ends in PICA. Fetal type right PCA. No major branch occlusion or flow limiting stenosis. Siphon and MCA luminal irregularity correlating with atheromatous changes seen on comparison CTA. Outpouchings from the left supraclinoid carotid appear chronically shaped and favor 1-2 mm infundibula. IMPRESSION: 1. No acute finding, including infarct. 2. Atrophy in keeping with history of Alzheimer's disease. 3. Intracranial MRA without acute finding or flow limiting stenosis. Electronically Signed   By: Marnee Spring M.D.   On: 07/30/2016 08:11   Medications:  Scheduled: . amLODipine  10 mg Oral Daily  . aspirin  300 mg Rectal Daily  . cholecalciferol  1,000 Units Oral Daily  . citalopram  20 mg Oral Daily  . memantine  28 mg Oral Daily   And  . donepezil  10 mg Oral Daily  . enoxaparin (LOVENOX) injection  40 mg Subcutaneous Q24H  . gabapentin  100 mg  Oral QHS  . insulin aspart  0-9 Units Subcutaneous Q4H  . levothyroxine  25 mcg Oral QAC breakfast  . lisinopril  40 mg Oral Daily  . magnesium oxide  400 mg Oral BID  . metoprolol tartrate  25 mg Oral BID  . pantoprazole  40 mg Oral Daily  . phenytoin  300 mg Oral QHS  . polyethylene glycol  17 g Oral Daily  . polyvinyl alcohol  1 drop Both Eyes BID  . potassium chloride  40 mEq Oral TID  . pravastatin  40 mg Oral q1800  . topiramate  50 mg Oral BID    Assessment/Plan:  1. Possible seizure: The episode as reported certainly could represent a complex partial seizure. Seizures are not uncommon in advanced Alzheimer's disease such as in this patient. MRI did not reveal any acute structural abnormality that might explain seizure. She does have a moderate degree of chronic ischemic change, which may also cause seizures in this age group. At this point, EEG did not reveal any active epileptic foci. She is on Keppra, though this may not be the best choice in a  demented patient as it can increase agitation and may even precipitate psychosis. Gabapentin is a reasonable agent in this setting, and this can be started at 100 mg daily and slowly titrated to a target dose of 600 mg 3 times daily, initially increasing by 100 mg each week. Given this very slow titration schedule, coverage with phenytoin (will start 300 Q HS tonight) and obtain level in AM )would be desirable until she is at her target dose with the gabapentin. Keppra will be discontinued with initiation of gabapentin and phenytoin as above. I see that she is on topiramate but am not sure why and would consider stopping this if possible given its adverse effect on cognition as well as the initiation of other AEDs as above. Seizure precaution should be maintained.  2. Alzheimer's dementia: Based upon reported information, she has advanced disease. As an outpatient appears that she is on donepezil and memantine and these can be continued. Her  dementia places her at risk for delirium from all causes. Every effort should be made to maintain adequate sleep-wake cycles while in hospital, making sure her room is well lit and active during the daytime and quiet and dark at night. Deliriogenic medications should be avoided, including opiates, benzodiazepines, and anything with strong anticholinergic properties.  3. Cerebrovascular disease: She has chronic small vessel changes on imaging, no evidence of acute stroke. Continue risk factor modification with tight control of blood pressure, glucose, and lipids. Recommend aspirin 81 mg daily and atorvastatin for secondary prevention.      Felicie Morn PA-C [facility] [phone]  07/31/2016, 9:23 AM   Neurology Attending Addendum:  This patient was seen, examined, and d/w PA. I have reviewed the note and agree with the findings, assessment and plan as documented with the following additions.   No major overnight events. She has not had any further seizure-like episodes. She is currently without complaints. Her exam remains limited by her marketed dementia and limited cooperation. However, it is nonfocal.  At this point, I suspect she had complex partial seizure activity in the setting of Alzheimer's disease. Workup has been otherwise unrevealing. Recommend continuing phenytoin and titrating gabapentin gradually to target dose of 600 mg 3 times daily as tolerated. She should continue phenytoin until she is on the goal dose of gabapentin, at which time it can be stopped.

## 2016-07-31 NOTE — Care Management Important Message (Signed)
Important Message  Patient Details  Name: [name] MRN: [id number] Date of Birth: [birth year]   Medicare Important Message Given:  Yes    Bernadette Hoit 07/31/2016, 8:13 AM

## 2016-08-01 ENCOUNTER — Inpatient Hospital Stay (HOSPITAL_COMMUNITY): Payer: Medicare Other

## 2016-08-01 DIAGNOSIS — I10 Essential (primary) hypertension: Secondary | ICD-10-CM

## 2016-08-01 DIAGNOSIS — I6789 Other cerebrovascular disease: Secondary | ICD-10-CM

## 2016-08-01 DIAGNOSIS — Z7189 Other specified counseling: Secondary | ICD-10-CM

## 2016-08-01 DIAGNOSIS — E039 Hypothyroidism, unspecified: Secondary | ICD-10-CM

## 2016-08-01 DIAGNOSIS — F0391 Unspecified dementia with behavioral disturbance: Secondary | ICD-10-CM

## 2016-08-01 DIAGNOSIS — M25551 Pain in right hip: Secondary | ICD-10-CM

## 2016-08-01 DIAGNOSIS — Z515 Encounter for palliative care: Secondary | ICD-10-CM

## 2016-08-01 LAB — BASIC METABOLIC PANEL
Anion gap: 7 (ref 5–15)
BUN: 6 mg/dL (ref 6–20)
CALCIUM: 9.6 mg/dL (ref 8.9–10.3)
CHLORIDE: 110 mmol/L (ref 101–111)
CO2: 22 mmol/L (ref 22–32)
CREATININE: 0.71 mg/dL (ref 0.44–1.00)
GFR calc Af Amer: >60 mL/min (ref >60)
GFR calc non Af Amer: >60 mL/min (ref >60)
GLUCOSE: 114 mg/dL — AB (ref 65–99)
Potassium: 3.4 mmol/L — ABNORMAL LOW (ref 3.5–5.1)
Sodium: 139 mmol/L (ref 135–145)

## 2016-08-01 LAB — ECHOCARDIOGRAM COMPLETE
CHL CUP MV DEC (S): 380
E/e' ratio: 19.35
EWDT: 380 ms
FS: 30 % (ref 28–44)
IVS/LV PW RATIO, ED: 1.17
LA diam end sys: 28 mm
LA diam index: 1.77 cm/m2
LASIZE: 28 mm
LAVOLA4C: 10 mL
LV PW d: 12.1 mm — AB (ref 0.6–1.1)
LV TDI E'LATERAL: 5.84
LV TDI E'MEDIAL: 4.26
LVEEAVG: 19.35
LVEEMED: 19.35
LVELAT: 5.84 cm/s
LVOT area: 2.54 cm2
LVOT diameter: 18 mm
Lateral S' vel: 7.83 cm/s
MV Peak grad: 5 mmHg
MV pk E vel: 113 m/s
MVPKAVEL: 150 m/s
WEIGHTICAEL: 2060.8 [oz_av]

## 2016-08-01 LAB — GLUCOSE, CAPILLARY
Glucose-Capillary: 113 mg/dL — ABNORMAL HIGH (ref 65–99)
Glucose-Capillary: 136 mg/dL — ABNORMAL HIGH (ref 65–99)
Glucose-Capillary: 139 mg/dL — ABNORMAL HIGH (ref 65–99)
Glucose-Capillary: 142 mg/dL — ABNORMAL HIGH (ref 65–99)
Glucose-Capillary: 254 mg/dL — ABNORMAL HIGH (ref 65–99)
Glucose-Capillary: 71 mg/dL (ref 65–99)

## 2016-08-01 LAB — MAGNESIUM: Magnesium: 1.9 mg/dL (ref 1.7–2.4)

## 2016-08-01 LAB — PHENYTOIN LEVEL, TOTAL: Phenytoin Lvl: 5.2 ug/mL — ABNORMAL LOW (ref 10.0–20.0)

## 2016-08-01 MED ORDER — PERFLUTREN LIPID MICROSPHERE
1.0000 mL | INTRAVENOUS | Status: AC | PRN
  Administered 2016-08-01: 2 mL via INTRAVENOUS
  Filled 2016-08-01: qty 10

## 2016-08-01 MED ORDER — POTASSIUM CHLORIDE CRYS ER 20 MEQ PO TBCR
40.0000 meq | EXTENDED_RELEASE_TABLET | Freq: Once | ORAL | Status: AC
  Administered 2016-08-01: 40 meq via ORAL

## 2016-08-01 MED ORDER — TROLAMINE SALICYLATE 10 % EX CREA: TOPICAL_CREAM | Freq: Two times a day (BID) | CUTANEOUS | Status: DC

## 2016-08-01 MED ORDER — MUSCLE RUB 10-15 % EX CREA
TOPICAL_CREAM | CUTANEOUS | Status: DC | PRN
  Filled 2016-08-01: qty 85

## 2016-08-01 NOTE — Progress Notes (Signed)
PROGRESS NOTE    [name]  ZOX:[id number] DOB: [birth year] DOA: 07/29/2016 PCP: Galvin Proffer, MD   Brief Narrative:  81 y.o.Fwith history of advanced dementia, hypertension, type 2 diabetes mellitus, hypothyroidism, and anxiety who presented to the ED from her nursing facility for altered mental status. Patient was reportedly in her usual state when she was noted to develop acute onset of right arm shaking, followed by "drawing up of her right side," and lack of verbal responsiveness. Patient has advanced dementia, but reportedly provides verbal responses at her baseline. She would not speak to staff following this episode at the nursing facility and she was transported to the ED for further evaluation. She had not exhibited similar behavior previously, had not voiced any recent complaints, and there had been no recent change in her medications.   Assessment & Plan:   Principal Problem:   Altered mental status Active Problems:   Hypothyroidism   Essential hypertension   Type 2 diabetes mellitus with hyperglycemia (HCC)   Dementia   Hypokalemia   Partial symptomatic epilepsy with complex partial seizures, not intractable, without status epilepticus (HCC)  1. Metabolic encephalopathy suspected seizure. Will continue gabapentin and bridge therapy with valproic acid as instructed by neurology. Will continue neuro checks and physical therapy. Phenytoin level at 5,2.   2. Hypokalemia. K at 3,4 with cr at 0.71 and serum bicarb at 22, will continue repletion with kcl and follow on renal panel in am.  3. Hypothyroidism. Will continue levothyroxine. Normal tsh on admission.  4. Alzheimer's dementia. Will continue conservative care, patient's family at the bedside, will continue on donepezil and citalopram.  5, htn. Continue blood pressure control with lisinopril and metoprolol. Blood pressure systolic 135 to 161.  6. T2DM, Serum glucose 113-139-254. Will continue insulin sliding  scale for glucose monitor and coverage.    DVT prophylaxis: Lovenox Code Status: DNR Family Communication: I spoke with patient's daughter at the bedside. Disposition Plan: possible return to SNF in 24hrs    Consultants:  Neurology  Procedures:  Antimicrobials:    Subjective: Patient at her baseline per her daughter who is at bedside. No signs of pain or dyspnea, poor interaction. Patient is non ambulatory due to hip fracture.   Objective: Vitals:   07/31/16 2101 08/01/16 0115 08/01/16 0434 08/01/16 0940  BP: (!) 133/99 (!) 141/67 (!) 146/66 (!) 161/64  Pulse: 66 (!) 59 (!) 57 (!) 59  Resp: Temp: 98.6 F (37 C) 98.9 F (37.2 C) 97.3 F (36.3 C) 97.9 F (36.6 C)  TempSrc: Oral Oral Axillary Oral  SpO2: 100% 99% 97% 98%  Weight:        Intake/Output Summary (Last 24 hours) at 08/01/16 1128 Last data filed at 07/31/16 1800  Gross per 24 hour  Intake              480 ml  Output                0 ml  Net              480 ml   Filed Weights   07/29/16 1428 07/31/16 0554  Weight: 60.8 kg (134 lb) 58.4 kg (128 lb 12.8 oz)    Examination:  General exam: deconditioned E ENT. Mild conjunctival pallor, oral mucosa dry. Respiratory system: Clear to auscultation. Respiratory effort normal. Poor inspiratory effort. No wheezing or rhonchi. Cardiovascular system: S1 & S2 heard, RRR. No JVD, murmurs, rubs, gallops or clicks.  No pedal edema. Gastrointestinal system: Abdomen is nondistended, soft and nontender. No organomegaly or masses felt. Normal bowel sounds heard. Central nervous system: awake but not interactive Extremities: unable to assess strength.  Skin: No rashes, lesions or ulcers  Data Reviewed: I have personally reviewed following labs and imaging studies  CBC:  Recent Labs Lab 07/29/16 1449 07/29/16 1454 07/29/16 2202  WBC 7.4  --   --   NEUTROABS 4.3  --   --   HGB 12.3 13.3 11.9*  HCT 38.0 39.0 35.0*  MCV 86.8  --   --   PLT 167  --    --    Basic Metabolic Panel:  Recent Labs Lab 07/29/16 1449 07/29/16 1454 07/29/16 2202 07/31/16 0207 08/01/16 0459  NA 144 149* 149* 140 139  K 2.6* 2.6* 3.1* 2.8* 3.4*  CL 111 108 112* 111 110  CO2 26  --   --  23 22  GLUCOSE 121* 116* 111* 141* 114*  BUN 12 10 6 6 6   CREATININE 0.82 0.80 0.80 0.68 0.71  CALCIUM 9.8  --   --  9.3 9.6  MG 2.1  --   --   --  1.9   GFR: Estimated Creatinine Clearance: 46.4 mL/min (by C-G formula based on SCr of 0.8 mg/dL). Liver Function Tests:  Recent Labs Lab 07/29/16 1449 07/31/16 0207  AST 17  --   ALT 13*  --   ALKPHOS 88  --   BILITOT 0.3  --   PROT 7.3  --   ALBUMIN 3.7 2.9*   No results for input(s): LIPASE, AMYLASE in the last 168 hours. No results for input(s): AMMONIA in the last 168 hours. Coagulation Profile:  Recent Labs Lab 07/29/16 1449  INR 1.06   Cardiac Enzymes: No results for input(s): CKTOTAL, CKMB, CKMBINDEX, TROPONINI in the last 168 hours. BNP (last 3 results) No results for input(s): PROBNP in the last 8760 hours. HbA1C: No results for input(s): HGBA1C in the last 72 hours. CBG:  Recent Labs Lab 07/31/16 2059 07/31/16 2329 08/01/16 0414 08/01/16 0809 08/01/16 1122  GLUCAP 159* 118* 113* 139* 254*   Lipid Profile: No results for input(s): CHOL, HDL, LDLCALC, TRIG, CHOLHDL, LDLDIRECT in the last 72 hours. Thyroid Function Tests:  Recent Labs  07/29/16 1449  TSH 1.724  FREET4 1.21*   Anemia Panel: No results for input(s): VITAMINB12, FOLATE, FERRITIN, TIBC, IRON, RETICCTPCT in the last 72 hours. Sepsis Labs: No results for input(s): PROCALCITON, LATICACIDVEN in the last 168 hours.  Recent Results (from the past 240 hour(s))  MRSA PCR Screening     Status: None   Collection Time: 07/29/16 11:45 PM  Result Value Ref Range Status   MRSA by PCR NEGATIVE NEGATIVE Final    Comment:        The GeneXpert MRSA Assay (FDA approved for NASAL specimens only), is one component of  a comprehensive MRSA colonization surveillance program. It is not intended to diagnose MRSA infection nor to guide or monitor treatment for MRSA infections.          Radiology Studies: No results found.      Scheduled Meds: . amLODipine  10 mg Oral Daily  . aspirin  81 mg Oral Daily  . cholecalciferol  1,000 Units Oral Daily  . citalopram  20 mg Oral Daily  . memantine  28 mg Oral Daily   And  . donepezil  10 mg Oral Daily  . enoxaparin (LOVENOX) injection  40 mg Subcutaneous  Q24H  . gabapentin  100 mg Oral QHS  . insulin aspart  0-9 Units Subcutaneous Q4H  . levothyroxine  25 mcg Oral QAC breakfast  . lisinopril  40 mg Oral Daily  . magnesium oxide  400 mg Oral BID  . metoprolol tartrate  25 mg Oral BID  . pantoprazole  40 mg Oral Daily  . phenytoin  300 mg Oral QHS  . polyethylene glycol  17 g Oral Daily  . polyvinyl alcohol  1 drop Both Eyes BID  . pravastatin  40 mg Oral q1800   Continuous Infusions:    LOS: 2 days      Fianna Snowball Annett Gula, MD [facility] Pager [phone]  If 7PM-7AM, please contact night-coverage [email or web address] Password [email or web address] 08/01/2016, 11:28 AM

## 2016-08-01 NOTE — Progress Notes (Signed)
Occupational Therapy Treatment Patient Details Name: [name] MRN: [id number] DOB: 04/25/1935 Today's Date: 08/01/2016    History of present illness 81 yo female admitted with R facial droop and lack of verbal responsiveness MRI (neg) workup for seizures underway  PMH: DM HTN ICH dementia    OT comments  Pt awake this session and responding to verbal questions. Pt using BIL UE to support EOB sitting. Pt able to sustain static sitting for ~ 5 minutes. Pt needs (A) and encouragement to eat breakfast tray.    Follow Up Recommendations  SNF    Equipment Recommendations  Wheelchair cushion (measurements OT);Wheelchair (measurements OT);Hospital bed    Recommendations for Other Services      Precautions / Restrictions Precautions Precautions: Fall       Mobility Bed Mobility Overal bed mobility: Needs Assistance;+2 for physical assistance;+ 2 for safety/equipment Bed Mobility: Supine to Sit;Sit to Supine;Rolling Rolling: Max assist   Supine to sit: +2 for physical assistance;Mod assist Sit to supine: +2 for physical assistance;Mod assist   General bed mobility comments: cuse for sequence and to scoot bil hips toward EOB  Transfers                 General transfer comment: not attempted since patient was w/c bound PTA    Balance Overall balance assessment: Needs assistance Sitting-balance support: Bilateral upper extremity supported;Feet supported Sitting balance-Leahy Scale: Zero Sitting balance - Comments: posterior lean                           ADL Overall ADL's : Needs assistance/impaired Eating/Feeding: Maximal assistance;Sitting Eating/Feeding Details (indicate cue type and reason): pt provided pancake sausage and orange juice at EOB. pt declines food but eating it when presented                            Toileting - Clothing Manipulation Details (indicate cue type and reason): incontinent of bladder on arrival. total (A) for  peri care supine       General ADL Comments: Pt responding verbally and eye contact with therapist during session . pt disoriented but responding. pt static sitting EOB for feeding with (A). pt reports fatigue at EOB and requesting to return supine      Vision                     Perception     Praxis      Cognition   Behavior During Therapy: Flat affect Overall Cognitive Status: History of cognitive impairments - at baseline                       Extremity/Trunk Assessment               Exercises     Shoulder Instructions       General Comments      Pertinent Vitals/ Pain       Pain Assessment: No/denies pain  Home Living                                          Prior Functioning/Environment              Frequency Min 2X/week     Progress Toward Goals  OT Goals(current goals can now be  found in the care plan section)  Progress towards OT goals: Progressing toward goals  Acute Rehab OT Goals Patient Stated Goal: none stated OT Goal Formulation: Patient unable to participate in goal setting Potential to Achieve Goals: Poor ADL Goals Additional ADL Goal #2: Pt will complete bed mobility min (A) level as precursor to adls  Plan Discharge plan remains appropriate    Co-evaluation                 End of Session     Activity Tolerance Patient tolerated treatment well   Patient Left in bed;with call bell/phone within reach   Nurse Communication Mobility status;Precautions        Time: 1610-9604 OT Time Calculation (min): 15 min  Charges: OT General Charges $OT Visit: 1 Procedure OT Treatments $Self Care/Home Management : 8-22 mins  Boone Master B 08/01/2016, 11:37 AM.  Mateo Flow   OTR/L Pager: [phone] Office: [phone] .

## 2016-08-01 NOTE — Consult Note (Signed)
Consultation Note Date: 08/01/2016   Patient Name: [name]  DOB: 19-Aug-1935  MRN: [id number]  Age / Sex: 81 y.o., female  PCP: Raelyn Number, MD Referring Physician: Tawni Millers, *  Reason for Consultation: Establishing goals of care  HPI/Patient Profile: 81 y.o. female  with past medical history of alzheimer's dementia, DM, HTN, and a hx of intracranial bleeding that caused seizures years ago.  She was admitted on 07/29/2016 from SNF with AMS, right arm shaking and a drawing up of the right side of her body.  Stroke was ruled out.  The patient's symptoms are felt to be due to seizure.  She has been started on gabapentin and dilantin.  Clinical Assessment and Goals of Care: I met with the patient and her daughter at bedside.  [name] added her father, [name], on by phone.  (He is house bound).    I introduced Palliative Medicine as specialized medical care for people living with serious illness. It focuses on providing relief from the symptoms and stress of a serious illness. The goal is to improve quality of life for both the patient and the family.  We over-viewed the patient's medical comorbidities and questions were answered.  We discussed alzheimer's dementia - it's step like trajectory, and what to expect at end of life including becoming bedbound, recurrent UTIs, aspiration, and pressure ulcerations.  [name] and her father seemed to understand and accept this information.    [name] commented that he feels his wife is overmedicated.  She has seen many doctors and each one adds another medication.  Debra and I reviewed the med list.  Currently their are 5 medications that could be considered for discontinuation:  Pravastatin, Namenda, Aricept (Namzaric), Aspirin, and vitamin D.  On admission tramadol and topamax were appropriately held.    [name] expressed that his goals for [facility] (the patient) were  comfort.  He did not feel that rehab would benefit her at this point.  He was supportive of involving hospice.  [name] expressed concern about her mother's frequent falls and her current facility being so far from her [name] lives in [place]).  She would like for her mother to be closer to her.  [name] tells me that her father married her mother when he was 37 and she was 68.  They traveled the world together.  They had [name] as well as a son.  Unfortunately the son had addiction problems with drugs and alcohol and passed away.  [name] comments that her mother has not had an easy life.  Finally [name] comments that her mother is having difficulty with right hip pain.  Narcotics and sedating medications are being avoided at this time due to her recent AMS and seizure.    "Hard Choices" Booklets were left with Debra.   NEXT OF KIN:  Husband [name], and daugther, [name] make [name]'s decisions for her.    SUMMARY OF RECOMMENDATIONS    DNR  Focus on comfort.  [place] if she returns to long  term care.  Engage Palliative Care Services if she is discharged to Acute Rehab.  Consider discontinuing medications that are not beneficial if the goal is comfort including: Aspirin, Pravastatin, Namzaric (not helpful in severe dementia), and vitamin D. **If Namzaric is discontinued, Please taper discontinuation over 1 - 2 months.  For right hip pain consider - heating pad, lidocaine patch, or Aspercreme.   Palliative Prophylaxis:   Aspiration, Delirium Protocol and Frequent Pain Assessment  Prognosis:   < 6 months:  Advanced dementia, aspiration, recurrent UTI, bed bound, recent hip fracture.  Discharge Planning: [place] for rehab with Palliative care service follow-up VS [place] with Hospice Services.      Primary Diagnoses: Present on Admission: . Altered mental status . Hypothyroidism . Type 2 diabetes mellitus with hyperglycemia ([name]) .  Dementia . Hypokalemia . Essential hypertension   I have reviewed the medical record, interviewed the patient and family, and examined the patient. The following aspects are pertinent.  Past Medical History:  Diagnosis Date  . Alzheimer disease    per facility paperwork dated 05/04/2016  . Anxiety   . Dementia   . Diabetes mellitus   . Hypercholesteremia   . Hypertension   . Intracranial bleeding (HCC)    10 years ago  . Seizures ([place])    approx 10 years when she had brain bleed  . Thyroid disease    Social History   Social History  . Marital status: Married    Spouse name: N/A  . Number of children: N/A  . Years of education: N/A   Social History Main Topics  . Smoking status: Former Research scientist (life sciences)  . Smokeless tobacco: Former Systems developer  . Alcohol use No  . Drug use: No  . Sexual activity: Not Asked   Other Topics Concern  . None   Social History Narrative  . None   Family History  Problem Relation Age of Onset  . Hypertension Other    Scheduled Meds: . amLODipine  10 mg Oral Daily  . aspirin  81 mg Oral Daily  . cholecalciferol  1,000 Units Oral Daily  . citalopram  20 mg Oral Daily  . memantine  28 mg Oral Daily   And  . donepezil  10 mg Oral Daily  . enoxaparin (LOVENOX) injection  40 mg Subcutaneous Q24H  . gabapentin  100 mg Oral QHS  . insulin aspart  0-9 Units Subcutaneous Q4H  . levothyroxine  25 mcg Oral QAC breakfast  . lisinopril  40 mg Oral Daily  . magnesium oxide  400 mg Oral BID  . metoprolol tartrate  25 mg Oral BID  . pantoprazole  40 mg Oral Daily  . phenytoin  300 mg Oral QHS  . polyethylene glycol  17 g Oral Daily  . polyvinyl alcohol  1 drop Both Eyes BID  . pravastatin  40 mg Oral q1800   Continuous Infusions:  PRN Meds:.acetaminophen, hydrALAZINE, perflutren lipid microspheres (DEFINITY) IV suspension, senna Medications Prior to Admission:  Prior to Admission medications   Medication Sig Start Date End Date Taking? Authorizing  Provider  acetaminophen (TYLENOL) 500 MG tablet Take 1,000 mg by mouth every 6 (six) hours as needed for headache.   Yes Historical Provider, MD  amLODipine (NORVASC) 5 MG tablet Take 2 tablets (10 mg total) by mouth daily. 05/09/16  Yes Shanker Kristeen Mans, MD  Artificial Tear Solution (SOOTHE XP OP) Apply 1 drop to eye 2 (two) times daily.   Yes Historical Provider, MD  cholecalciferol (VITAMIN D) 1000 UNITS tablet Take 1,000 Units by mouth daily.   Yes Historical Provider, MD  citalopram (CELEXA) 20 MG tablet Take 20 mg by mouth daily.   Yes Historical Provider, MD  levothyroxine (SYNTHROID, LEVOTHROID) 25 MCG tablet Take 25 mcg by mouth daily before breakfast.   Yes Historical Provider, MD  lisinopril (PRINIVIL,ZESTRIL) 40 MG tablet Take 40 mg by mouth daily.   Yes Historical Provider, MD  loperamide (IMODIUM A-D) 2 MG tablet Take 2 mg by mouth 4 (four) times daily as needed for diarrhea or loose stools.   Yes Historical Provider, MD  magnesium oxide (MAG-OX) 400 MG tablet Take 400 mg by mouth 2 (two) times daily.   Yes Historical Provider, MD  Memantine HCl-Donepezil HCl (NAMZARIC) 28-10 MG CP24 Take 1 tablet by mouth daily.   Yes Historical Provider, MD  metFORMIN (GLUCOPHAGE) 500 MG tablet Take 250 mg by mouth 2 (two) times daily with a meal.   Yes Historical Provider, MD  metoprolol tartrate (LOPRESSOR) 25 MG tablet Take 25 mg by mouth 2 (two) times daily.   Yes Historical Provider, MD  pantoprazole (PROTONIX) 40 MG tablet Take 1 tablet (40 mg total) by mouth daily. 05/09/16  Yes Shanker Kristeen Mans, MD  polyethylene glycol (MIRALAX / GLYCOLAX) packet Take 17 g by mouth daily. 05/09/16  Yes Shanker Kristeen Mans, MD  pravastatin (PRAVACHOL) 40 MG tablet Take 40 mg by mouth daily.   Yes Historical Provider, MD  senna (SENOKOT) 8.6 MG TABS tablet Take 1 tablet (8.6 mg total) by mouth daily as needed for mild constipation. 05/09/16  Yes Shanker Kristeen Mans, MD  topiramate (TOPAMAX) 50 MG tablet Take 50 mg by  mouth 2 (two) times daily.   Yes Historical Provider, MD  traMADol (ULTRAM) 50 MG tablet Take 1 tablet (50 mg total) by mouth every 6 (six) hours as needed for moderate pain. Patient taking differently: Take 50 mg by mouth every 6 (six) hours as needed.  05/09/16  Yes Shanker Kristeen Mans, MD  traMADol (ULTRAM) 50 MG tablet Take 50 mg by mouth 3 (three) times daily.   Yes Historical Provider, MD  feeding supplement, ENSURE ENLIVE, (ENSURE ENLIVE) LIQD Take 237 mLs by mouth 2 (two) times daily between meals. Patient not taking: Reported on 07/29/2016 05/09/16   Jonetta Osgood, MD  ibuprofen (ADVIL,MOTRIN) 800 MG tablet Take 1 tablet (800 mg total) by mouth every 8 (eight) hours as needed. Patient not taking: Reported on 07/29/2016 05/09/16   Jonetta Osgood, MD   Allergies  Allergen Reactions  . Penicillins Nausea And Vomiting    Has patient had a PCN reaction causing immediate rash, facial/tongue/throat swelling, SOB or lightheadedness with hypotension: {unknown Has patient had a PCN reaction causing severe rash involving mucus membranes or skin necrosis: unknown Has patient had a PCN reaction that required hospitalization: unknown Has patient had a PCN reaction occurring within the last 10 years: unknown If all of the above answers are "NO", then may proceed with Cephalosporin use.   . Promethazine Hcl Nausea And Vomiting  . Sulfonamide Derivatives Other (See Comments)    unknown   Review of Systems: patient is non responsive to me  Physical Exam  Thin, Well developed female.  Non verbal and non responsive to me. CV rrr Resp nad Abdomen soft nt Extremities no edema  Vital Signs: BP (!) 161/64 (BP Location: Right Arm)   Pulse (!) 59   Temp 97.9 F (36.6 C) (Oral)   Resp  18   Wt 58.4 kg (128 lb 12.8 oz)   SpO2 98%   BMI 25.15 kg/m  Pain Assessment: PAINAD POSS *See Group Information*: S-Acceptable,Sleep, easy to arouse Pain Score: Asleep   SpO2: SpO2: 98 % O2 Device:SpO2: 98  % O2 Flow Rate: .   IO: Intake/output summary:  Intake/Output Summary (Last 24 hours) at 08/01/16 1116 Last data filed at 07/31/16 1800  Gross per 24 hour  Intake              480 ml  Output                0 ml  Net              480 ml    LBM: Last BM Date: 07/31/16 Baseline Weight: Weight: 60.8 kg (134 lb) Most recent weight: Weight: 58.4 kg (128 lb 12.8 oz)     Palliative Assessment/Data:   Flowsheet Rows   Flowsheet Row Most Recent Value  Intake Tab  Referral Department  Hospitalist  Unit at Time of Referral  Med/Surg Unit  Palliative Care Primary Diagnosis  Neurology  Date Notified  07/31/16  Palliative Care Type  New Palliative care  Reason for referral  Clarify Goals of Care  Date of Admission  07/29/16  # of days IP prior to Palliative referral  2  Clinical Assessment  Psychosocial & Spiritual Assessment  Palliative Care Outcomes      Time In:  11:30 Time Out: 12:40 Time Total: 70 min Greater than 50%  of this time was spent counseling and coordinating care related to the above assessment and plan.  Dicussed with LCSW, Darden Dates.  Signed by: Imogene Burn, PA-C Palliative Medicine Pager: [phone]   Please contact Palliative Medicine Team phone at [phone] for questions and concerns.  For individual provider: See Shea Evans

## 2016-08-01 NOTE — Progress Notes (Signed)
Nutrition Brief Note  Patient identified on the Malnutrition Screening Tool (MST) Report  Wt Readings from Last 15 Encounters:  07/31/16 128 lb 12.8 oz (58.4 kg)  05/04/16 134 lb 0.6 oz (60.8 kg)  04/24/16 150 lb (68 kg)  12/07/15 150 lb (68 kg)  08/29/15 150 lb (68 kg)  06/14/15 150 lb (68 kg)  03/03/15 148 lb (67.1 kg)  01/18/15 148 lb (67.1 kg)  01/03/15 148 lb (67.1 kg)  12/26/14 148 lb (67.1 kg)  09/24/11 148 lb (67.1 kg)    Body mass index is 25.15 kg/m. Patient meets criteria for Overweight based on current BMI.   Current diet order is Dysphagia 1, thin liquids , patient is consuming approximately 25% of meals at this time.  Per chart, pt's family has met with palliative care and recommendations to focus on comfort and DNR status. Labs and medications reviewed.   No nutrition interventions warranted at this time. If nutrition issues arise, please consult RD.   Scarlette Ar RD, LDN Inpatient Clinical Dietitian Pager: [phone] After Hours Pager: [phone]

## 2016-08-01 NOTE — Progress Notes (Addendum)
  Echocardiogram 2D Echocardiogram with Definity has been performed.  [name] 08/01/2016, 10:23 AM

## 2016-08-02 DIAGNOSIS — R410 Disorientation, unspecified: Secondary | ICD-10-CM

## 2016-08-02 DIAGNOSIS — E1165 Type 2 diabetes mellitus with hyperglycemia: Secondary | ICD-10-CM

## 2016-08-02 DIAGNOSIS — E876 Hypokalemia: Secondary | ICD-10-CM

## 2016-08-02 LAB — GLUCOSE, CAPILLARY
GLUCOSE-CAPILLARY: 113 mg/dL — AB (ref 65–99)
GLUCOSE-CAPILLARY: 140 mg/dL — AB (ref 65–99)
GLUCOSE-CAPILLARY: 151 mg/dL — AB (ref 65–99)
GLUCOSE-CAPILLARY: 131 mg/dL — AB (ref 65–99)

## 2016-08-02 LAB — BASIC METABOLIC PANEL
ANION GAP: 9 (ref 5–15)
BUN: 7 mg/dL (ref 6–20)
CHLORIDE: 109 mmol/L (ref 101–111)
CO2: 21 mmol/L — ABNORMAL LOW (ref 22–32)
Calcium: 10 mg/dL (ref 8.9–10.3)
Creatinine, Ser: 0.76 mg/dL (ref 0.44–1.00)
Glucose, Bld: 121 mg/dL — ABNORMAL HIGH (ref 65–99)
POTASSIUM: 5.5 mmol/L — AB (ref 3.5–5.1)
SODIUM: 139 mmol/L (ref 135–145)

## 2016-08-02 LAB — POTASSIUM: Potassium: 4.1 mmol/L (ref 3.5–5.1)

## 2016-08-02 MED ORDER — GABAPENTIN 100 MG PO CAPS: 100.0000 mg | ORAL_CAPSULE | Freq: Every day | ORAL | 0 refills | Status: AC

## 2016-08-02 MED ORDER — ASPIRIN 81 MG PO CHEW: 81.0000 mg | CHEWABLE_TABLET | Freq: Every day | ORAL | 0 refills | Status: DC

## 2016-08-02 MED ORDER — ACETAMINOPHEN 325 MG PO TABS
650.0000 mg | ORAL_TABLET | Freq: Three times a day (TID) | ORAL | Status: DC
  Administered 2016-08-02: 650 mg via ORAL
  Filled 2016-08-02 (×2): qty 2

## 2016-08-02 MED ORDER — ACETAMINOPHEN 500 MG PO TABS: 500.0000 mg | ORAL_TABLET | Freq: Three times a day (TID) | ORAL | 0 refills | Status: AC | PRN

## 2016-08-02 MED ORDER — PHENYTOIN SODIUM EXTENDED 300 MG PO CAPS: 300.0000 mg | ORAL_CAPSULE | Freq: Every day | ORAL | 0 refills | Status: AC

## 2016-08-02 NOTE — Progress Notes (Signed)
Daily Progress Note   Patient Name: [name]       Date: 08/02/2016 DOB: 1935/06/28  Age: 81 y.o. MRN#: [id number] Attending Physician: Coralie Keens, * Primary Care Physician: Galvin Proffer, MD Admit Date: 07/29/2016  Reason for Consultation/Follow-up: Establishing goals of care  Subjective: Patient is more awake and alert.  She reports pain in her bottom (7 - 8).  She says it hurts all over when she moves.   We discussed her family and her travel.  She was pleasantly confused.  I spoke with her husband Jonny Ruiz on the phone and updated him on her current presentation.  He was happy to hear that she is doing better. I mentioned their daughter's desire to have her mother closer to her in [place].  [name] replied, we've been thru this several times.  The net result of the conversation was that Jonny Ruiz wanted his wife back at the facility in [place] with Hospice support.  Length of Stay: 3  Current Medications: Scheduled Meds:  . amLODipine  10 mg Oral Daily  . aspirin  81 mg Oral Daily  . cholecalciferol  1,000 Units Oral Daily  . citalopram  20 mg Oral Daily  . memantine  28 mg Oral Daily   And  . donepezil  10 mg Oral Daily  . enoxaparin (LOVENOX) injection  40 mg Subcutaneous Q24H  . gabapentin  100 mg Oral QHS  . insulin aspart  0-9 Units Subcutaneous Q4H  . levothyroxine  25 mcg Oral QAC breakfast  . lisinopril  40 mg Oral Daily  . magnesium oxide  400 mg Oral BID  . metoprolol tartrate  25 mg Oral BID  . pantoprazole  40 mg Oral Daily  . phenytoin  300 mg Oral QHS  . polyethylene glycol  17 g Oral Daily  . polyvinyl alcohol  1 drop Both Eyes BID  . pravastatin  40 mg Oral q1800    Continuous Infusions:    PRN Meds: acetaminophen, hydrALAZINE, MUSCLE RUB,  senna  Physical Exam      Well developed, thin, pleasantly confused.  Awake, alert, easily distracted. Appropriately responsive.  Has trouble with word finding. CV rrr Resp nad abd non tender     Vital Signs: BP (!) 147/83 (BP Location: Right Arm)   Pulse 72  Temp 98.9 F (37.2 C) (Oral)   Resp 20   Wt 58.4 kg (128 lb 12.8 oz)   SpO2 99%   BMI 25.15 kg/m  SpO2: SpO2: 99 % O2 Device: O2 Device: Not Delivered O2 Flow Rate:    Intake/output summary:   Intake/Output Summary (Last 24 hours) at 08/02/16 1409 Last data filed at 08/01/16 1800  Gross per 24 hour  Intake              240 ml  Output                0 ml  Net              240 ml   LBM: Last BM Date: 07/31/16 Baseline Weight: Weight: 60.8 kg (134 lb) Most recent weight: Weight: 58.4 kg (128 lb 12.8 oz)       Palliative Assessment/Data:    Flowsheet Rows   Flowsheet Row Most Recent Value  Intake Tab  Referral Department  Hospitalist  Unit at Time of Referral  Med/Surg Unit  Palliative Care Primary Diagnosis  Other (Comment)  Date Notified  07/31/16  Palliative Care Type  New Palliative care  Reason for referral  Clarify Goals of Care  Date of Admission  07/29/16  Date first seen by Palliative Care  08/01/16  # of days Palliative referral response time  1 Day(s)  # of days IP prior to Palliative referral  2  Clinical Assessment  Palliative Performance Scale Score  30%  Psychosocial & Spiritual Assessment  Palliative Care Outcomes  Patient/Family meeting held?  Yes  Who was at the meeting?  patient and husband (on the phone)  Palliative Care Outcomes  Improved pain interventions, Counseled regarding hospice      Patient Active Problem List   Diagnosis Date Noted  . Right hip pain   . Palliative care encounter   . Encounter for hospice care discussion   . Partial symptomatic epilepsy with complex partial seizures, not intractable, without status epilepticus (HCC)   . Altered mental status 07/29/2016   . Hypokalemia 07/29/2016  . Displaced fracture of right femoral neck (HCC) 05/07/2016  . Hip fracture (HCC) 05/04/2016  . Closed right hip fracture (HCC) 05/04/2016  . Type 2 diabetes mellitus with hyperglycemia (HCC) 05/04/2016  . Dementia 05/04/2016  . Closed fracture of part of upper end of humerus 01/03/2015  . Hypothyroidism 12/19/2010  . DIABETES MELLITUS, TYPE II 12/19/2010  . DEMENTIA 12/19/2010  . ANXIETY 12/19/2010  . Essential hypertension 12/19/2010  . DIZZINESS 12/19/2010  . FATIGUE 12/19/2010  . HEADACHE 12/19/2010    Palliative Care Assessment & Plan   Patient Profile:80 y.o. female  with past medical history of alzheimer's dementia, DM, HTN, and a hx of intracranial bleeding that caused seizures years ago.  She was admitted on 07/29/2016 from SNF with AMS, right arm shaking and a drawing up of the right side of her body.  Stroke was ruled out.  The patient's symptoms are felt to be due to seizure.  She has been started on gabapentin and dilantin.   Assessment: Awake, alert, back to baseline.  Complaining of pain in her bottom.  Recommendations/Plan:  When discharged, husband would like for her to return to [facility] with hospice services  Consider discontinuing medications not geared towards comfort including:  Vitamin D, Pravastatin, Aspirin and tapering Namzaric over 1-2 months until discontinued.    Will schedule tylenol q 8 for pain relief.  Code Status:  DNR  Prognosis:   < 6 months based on advanced dementia, bedbound status, decreased PO intake.  Discharge Planning:  Skilled Nursing Facility with Hospice  Care plan was discussed with husband Jonny Ruiz and LCSW Dede Query.  Thank you for allowing the Palliative Medicine Team to assist in the care of this patient.   Time In: 2:00 Time Out: 2:21 Total Time 21 Prolonged Time Billed no      Greater than 50%  of this time was spent counseling and coordinating care related to  the above assessment and plan.  Stephani Police, PA-C  Please contact Palliative Medicine Team phone at [phone] for questions and concerns.

## 2016-08-02 NOTE — Progress Notes (Addendum)
Speech Language Pathology Treatment: Dysphagia  Patient Details Name: [name] MRN: [id number] DOB: [birth year] Today's Date: 08/02/2016 Time: 2263-3354 SLP Time Calculation (min) (ACUTE ONLY): 8 min  Assessment / Plan / Recommendation Clinical Impression  Palliative notes read re: transition to comfort care. SLP goal is to upgrade to a higher texture than puree if safe and desired (explained to pt). She declined all solids stating she only wants water. No s/s aspiration with consecutive straw sips water. SLP suspects pt will be able to masticate regular texture if desired therefore will upgrade to regular; continue thin. No further ST needed.   HPI HPI: Tara Bireley Pitzenis a 81 y.o.femalewith medical history significant for advanced dementia, hypertension, type 2 diabetes mellitus, hypothyroidism, and anxiety who presents the emergency department from her nursing facility for evaluation of altered mental status. Chest x-rays notable for low volumes with bibasilar atelectasis. Noncontrast head CT was obtained and negative for acute intracranial abnormality. Per chart it was suspected that the patient's presentation was secondary to seizure rather than CVA, hypokalemia.      SLP Plan  Discharge SLP treatment due to (comment) (transitioning to comfort care)     Recommendations  Diet recommendations: Regular;Thin liquid Liquids provided via: Cup;Straw Medication Administration: Crushed with puree Supervision: Staff to assist with self feeding;Full supervision/cueing for compensatory strategies Compensations: Minimize environmental distractions Postural Changes and/or Swallow Maneuvers: Seated upright 90 degrees             Oral Care Recommendations: Oral care BID Follow up Recommendations: Skilled Nursing facility Plan: Discharge SLP treatment due to (comment) (transitioning to comfort care)                     Royce Macadamia 08/02/2016, 11:44 AM   Breck Coons Lonell Face.Ed [employer] [phone]

## 2016-08-02 NOTE — Progress Notes (Signed)
Patient is being discharged to [facility] in [place]. Discharge package was given to transporter

## 2016-08-02 NOTE — Care Management Note (Signed)
Case Management Note  Patient Details  Name: [name] MRN: [id number] Date of Birth: 03/14/1935  Subjective/Objective:                    Action/Plan: Patient discharging back to [facility] memory care today with hospice following. No further needs per CM.   Expected Discharge Date:                  Expected Discharge Plan:  Skilled Nursing Facility  In-House Referral:  Clinical Social Work  Discharge planning Services  CM Consult  Post Acute Care Choice:    Choice offered to:     DME Arranged:    DME Agency:     HH Arranged:    HH Agency:     Status of Service:  Completed, signed off  If discussed at [employer], dates discussed:    Additional Comments:  Kermit Balo, RN 08/02/2016, 2:14 PM

## 2016-08-02 NOTE — [place] FL2 (Signed)
[place] MEDICAID FL2 LEVEL OF CARE SCREENING TOOL     IDENTIFICATION  Patient Name: [name] Birthdate: Jul 16, 1935 Sex: female Admission Date (Current Location): 07/29/2016  [facility] and [place] Number:  Producer, television/film/video and Address:  [facility] [place], [address], [place], [place] [address]      Provider Number: [id number]  Attending Physician Name and Address:  Coralie Keens, *  Relative Name and Phone Number:       Current Level of Care: Hospital Recommended Level of Care: Assisted Living Facility, Memory Care Prior Approval Number:    Date Approved/Denied:   PASRR Number: [id number]  Discharge Plan: Other (Comment) (Memory Care)    Current Diagnoses: Patient Active Problem List   Diagnosis Date Noted  . Right hip pain   . Palliative care encounter   . Encounter for hospice care discussion   . Partial symptomatic epilepsy with complex partial seizures, not intractable, without status epilepticus (HCC)   . Altered mental status 07/29/2016  . Hypokalemia 07/29/2016  . Displaced fracture of right femoral neck (HCC) 05/07/2016  . Hip fracture (HCC) 05/04/2016  . Closed right hip fracture (HCC) 05/04/2016  . Type 2 diabetes mellitus with hyperglycemia (HCC) 05/04/2016  . Dementia 05/04/2016  . Closed fracture of part of upper end of humerus 01/03/2015  . Hypothyroidism 12/19/2010  . DIABETES MELLITUS, TYPE II 12/19/2010  . DEMENTIA 12/19/2010  . ANXIETY 12/19/2010  . Essential hypertension 12/19/2010  . DIZZINESS 12/19/2010  . FATIGUE 12/19/2010  . HEADACHE 12/19/2010    Orientation RESPIRATION BLADDER Height & Weight     Self  Normal Incontinent Weight: 128 lb 12.8 oz (58.4 kg) Height:     BEHAVIORAL SYMPTOMS/MOOD NEUROLOGICAL BOWEL NUTRITION STATUS      Incontinent Diet (DYS 1)  AMBULATORY STATUS COMMUNICATION OF NEEDS Skin   Extensive Assist Verbally Normal                       Personal Care  Assistance Level of Assistance  Bathing, Feeding, Dressing Bathing Assistance: Limited assistance Feeding assistance: Limited assistance Dressing Assistance: Limited assistance     Functional Limitations Info  Sight, Hearing, Speech Sight Info: Adequate Hearing Info: Adequate Speech Info: Adequate    SPECIAL CARE FACTORS FREQUENCY  PT (By licensed PT)     PT Frequency: 5              Contractures Contractures Info: Not present    Additional Factors Info  Code Status, Allergies Code Status Info: DNR Allergies Info: Penicillins, Promethazine HCL, Sulfonamide Derivatives           Discharge Medications: Please see discharge summary for a list of discharge medications.  Discharge Instructions     Medication List    STOP taking these medications   ibuprofen 800 MG tablet Commonly known as:  ADVIL,MOTRIN  loperamide 2 MG tablet Commonly known as:  IMODIUM A-D  pravastatin 40 MG tablet Commonly known as:  PRAVACHOL  senna 8.6 MG Tabs tablet Commonly known as:  SENOKOT  topiramate 50 MG tablet Commonly known as:  TOPAMAX  traMADol 50 MG tablet Commonly known as:  ULTRAM    TAKE these medications   acetaminophen 500 MG tablet Commonly known as:  TYLENOL Take 1 tablet (500 mg total) by mouth every 8 (eight) hours as needed for headache. What changed:  how much to take  when to take this  amLODipine 5 MG tablet Commonly known as:  NORVASC Take 2 tablets (10 mg total) by mouth daily.  aspirin 81 MG chewable tablet Chew 1 tablet (81 mg total) by mouth daily.  cholecalciferol 1000 units tablet Commonly known as:  VITAMIN D Take 1,000 Units by mouth daily.  citalopram 20 MG tablet Commonly known as:  CELEXA Take 20 mg by mouth daily.  feeding supplement (ENSURE ENLIVE) Liqd Take 237 mLs by mouth 2 (two) times daily between meals.  gabapentin 100 MG capsule Commonly known as:  NEURONTIN Take 1 capsule (100 mg total) by mouth at bedtime.   levothyroxine 25 MCG tablet Commonly known as:  SYNTHROID, LEVOTHROID Take 25 mcg by mouth daily before breakfast.  lisinopril 40 MG tablet Commonly known as:  PRINIVIL,ZESTRIL Take 40 mg by mouth daily.  magnesium oxide 400 MG tablet Commonly known as:  MAG-OX Take 400 mg by mouth 2 (two) times daily.  metFORMIN 500 MG tablet Commonly known as:  GLUCOPHAGE Take 250 mg by mouth 2 (two) times daily with a meal.  metoprolol tartrate 25 MG tablet Commonly known as:  LOPRESSOR Take 25 mg by mouth 2 (two) times daily.  NAMZARIC 28-10 MG Cp24 Generic drug:  Memantine HCl-Donepezil HCl Take 1 tablet by mouth daily.  pantoprazole 40 MG tablet Commonly known as:  PROTONIX Take 1 tablet (40 mg total) by mouth daily.  phenytoin 300 MG ER capsule Commonly known as:  DILANTIN Take 1 capsule (300 mg total) by mouth at bedtime.  polyethylene glycol packet Commonly known as:  MIRALAX / GLYCOLAX Take 17 g by mouth daily.  SOOTHE XP OP Apply 1 drop to eye 2 (two) times daily.       Relevant Imaging Results:  Relevant Lab Results:   Additional Information SSN:  [id number]   Pt will have [facility] following.   Dede Query, LCSW

## 2016-08-02 NOTE — Discharge Summary (Signed)
Physician Discharge Summary  [name] ZOX:[id number] DOB: 1935-03-28 DOA: 07/29/2016  PCP: Galvin Proffer, MD  Admit date: 07/29/2016 Discharge date: 08/02/2016  Admitted From:  home Disposition:  Home with hospice  Recommendations for Outpatient Follow-up:  1. Follow up with PCP in 1-2 weeks  Home Health: no Equipment/Devices: no  Discharge Condition: stable CODE STATUS: dnr Diet recommendation: cardiac and diabetic  Brief/Interim Summary:  This is a 81 year old female who presents to hospital with the chief complaint of altered mental status. She was noted to have a right arm shaking followed by paresis and lack of verbal response. On initial physical examination patient was awake with intermittently responsive to verbal or commands, her oral mucosa was moist, neck was supple, lungs were clear to auscultation bilaterally, heart S1-S2 present rhythmic, abdomen was soft nontender, lower extremities no edema, neurologically patient was nonfocal. Blood pressure was 117/50 with respiratory rate of 16. Her kidney function was normal, her urinalysis was negative for infection. Her head CT showed no intracranial abnormalities.   She was admitted to hospital with the working diagnosis of possible seizure to rule out CVA.  1. Seizure. Patient was evaluated by neurology, MRI did not show any acute abnormalities, EEG did not reveal active epileptic foci, Keppra was discontinued patient was placed on gabapentin with instructions to start at 100 mg daily and slowly titrated to a target dose of 600 mg 3 times daily, initially increasing by 100 mg each week. Given this very slow titration schedule, coverage with phenytoin until she is at her target dose with the gabapentin, then phenytoin should be discontinued.  2. Hypokalemia. Potassium was repleted adequately. Discharge potassium is 4.1, kidney function when stable with creatinine between 0.6 and 0.7.  3. Hypothyroidism patient was continued on  levothyroxine with normal TSH on admission  4. Alzheimer's dementia. Poor prognosis due to to advance disease. Patient was continued on citalopram. Patient currently on memantine and donepezil combination, will recommend to do taper discontinuation of this medication over the course of 1-2 months, per recommendations from palliative care.  5. Hypertension. Patient was continued on lisinopril and metoprolol.  6. Type 2 diabetes mellitus. Patient serum glucose remained stable, she had insulin sliding-scale when she was in hospital. Patient will continue metformin by the time of discharge.  Discharge Diagnoses:  Principal Problem:   Altered mental status Active Problems:   Hypothyroidism   Essential hypertension   Type 2 diabetes mellitus with hyperglycemia (HCC)   Dementia   Hypokalemia   Partial symptomatic epilepsy with complex partial seizures, not intractable, without status epilepticus (HCC)   Right hip pain   Palliative care encounter   Encounter for hospice care discussion    Discharge Instructions     Medication List    STOP taking these medications   ibuprofen 800 MG tablet Commonly known as:  ADVIL,MOTRIN   loperamide 2 MG tablet Commonly known as:  IMODIUM A-D   pravastatin 40 MG tablet Commonly known as:  PRAVACHOL   senna 8.6 MG Tabs tablet Commonly known as:  SENOKOT   topiramate 50 MG tablet Commonly known as:  TOPAMAX   traMADol 50 MG tablet Commonly known as:  ULTRAM     TAKE these medications   acetaminophen 500 MG tablet Commonly known as:  TYLENOL Take 1 tablet (500 mg total) by mouth every 8 (eight) hours as needed for headache. What changed:  how much to take  when to take this   amLODipine 5 MG tablet Commonly known  as:  NORVASC Take 2 tablets (10 mg total) by mouth daily.   aspirin 81 MG chewable tablet Chew 1 tablet (81 mg total) by mouth daily.   cholecalciferol 1000 units tablet Commonly known as:  VITAMIN D Take 1,000  Units by mouth daily.   citalopram 20 MG tablet Commonly known as:  CELEXA Take 20 mg by mouth daily.   feeding supplement (ENSURE ENLIVE) Liqd Take 237 mLs by mouth 2 (two) times daily between meals.   gabapentin 100 MG capsule Commonly known as:  NEURONTIN Take 1 capsule (100 mg total) by mouth at bedtime.   levothyroxine 25 MCG tablet Commonly known as:  SYNTHROID, LEVOTHROID Take 25 mcg by mouth daily before breakfast.   lisinopril 40 MG tablet Commonly known as:  PRINIVIL,ZESTRIL Take 40 mg by mouth daily.   magnesium oxide 400 MG tablet Commonly known as:  MAG-OX Take 400 mg by mouth 2 (two) times daily.   metFORMIN 500 MG tablet Commonly known as:  GLUCOPHAGE Take 250 mg by mouth 2 (two) times daily with a meal.   metoprolol tartrate 25 MG tablet Commonly known as:  LOPRESSOR Take 25 mg by mouth 2 (two) times daily.   NAMZARIC 28-10 MG Cp24 Generic drug:  Memantine HCl-Donepezil HCl Take 1 tablet by mouth daily.   pantoprazole 40 MG tablet Commonly known as:  PROTONIX Take 1 tablet (40 mg total) by mouth daily.   phenytoin 300 MG ER capsule Commonly known as:  DILANTIN Take 1 capsule (300 mg total) by mouth at bedtime.   polyethylene glycol packet Commonly known as:  MIRALAX / GLYCOLAX Take 17 g by mouth daily.   SOOTHE XP OP Apply 1 drop to eye 2 (two) times daily.       Allergies  Allergen Reactions  . Penicillins Nausea And Vomiting    Has patient had a PCN reaction causing immediate rash, facial/tongue/throat swelling, SOB or lightheadedness with hypotension: {unknown Has patient had a PCN reaction causing severe rash involving mucus membranes or skin necrosis: unknown Has patient had a PCN reaction that required hospitalization: unknown Has patient had a PCN reaction occurring within the last 10 years: unknown If all of the above answers are "NO", then may proceed with Cephalosporin use.   . Promethazine Hcl Nausea And Vomiting  .  Sulfonamide Derivatives Other (See Comments)    unknown    Consultations:  Neurology   Procedures/Studies: Ct Angio Head W Or Wo Contrast  Result Date: 07/29/2016 CLINICAL DATA:  Right hand trembling. Right-sided mouth droop. Alzheimer's disease. EXAM: CT ANGIOGRAPHY HEAD AND NECK TECHNIQUE: Multidetector CT imaging of the head and neck was performed using the standard protocol during bolus administration of intravenous contrast. Multiplanar CT image reconstructions and MIPs were obtained to evaluate the vascular anatomy. Carotid stenosis measurements (when applicable) are obtained utilizing NASCET criteria, using the distal internal carotid diameter as the denominator. CONTRAST:  75 cc Isovue 370 intravenous COMPARISON:  Noncontrast head CT from earlier today. FINDINGS: CTA NECK Aortic arch: 4 vessel branching pattern. Diffuse atherosclerotic calcification. Right carotid system: Scattered predominately calcified atherosclerotic plaque. No flow limiting stenosis or dissection. No plaque ulceration. Occasional artifact. Left carotid system: Intermittent artifact. Scattered atheromatous calcification, greatest at the bifurcation. Stenosis measures up to 25% at the proximal ICA. No evidence of dissection. Shallow excavation of plaque along the lateral proximal ICA. Vertebral arteries:No proximal subclavian stenosis on the right. Right vertebral artery dominance, sole contributor to the basilar. No flow limiting stenosis. No evidence of  dissection when allowing for levels of artifact. Skeleton: No acute finding. Other neck: No incidental mass or adenopathy in the neck. CTA HEAD Anterior circulation: Scattered atheromatous calcifications greatest at the bilateral siphons. No major branch occlusion or flow limiting stenosis. Negative for aneurysm. Posterior circulation: Strongly dominant right vertebral artery. Fetal type right PCA. No major branch occlusion or flow limiting stenosis. Venous sinuses: Patent  Anatomic variants: Fetal type PCA on the right. Left posterior communicating artery infundibulum. Delayed phase: Negative for mass or other abnormal enhancement. Atrophy and chronic microvascular disease as previously described. IMPRESSION: 1. No major vessel occlusion or flow limiting stenosis. 2. Proximal left ICA stenosis measuring ~25% with lateral wall plaque ulceration. Electronically Signed   By: Marnee Spring M.D.   On: 07/29/2016 18:08  Ct Head Wo Contrast  Result Date: 07/29/2016 CLINICAL DATA:  Right facial droop today.  Code stroke EXAM: CT HEAD WITHOUT CONTRAST TECHNIQUE: Contiguous axial images were obtained from the base of the skull through the vertex without intravenous contrast. COMPARISON:  04/24/2016 FINDINGS: There is atrophy and chronic small vessel disease changes. No acute intracranial abnormality. Specifically, no hemorrhage, hydrocephalus, mass lesion, acute infarction, or significant intracranial injury. No acute calvarial abnormality. Visualized paranasal sinuses and mastoids clear. Orbital soft tissues unremarkable. IMPRESSION: No acute intracranial abnormality. Atrophy, chronic microvascular disease. Critical Value/emergent results were called by telephone at the time of interpretation on 07/29/2016 at 3:05 pm to Dr. Hyacinth Meeker , who verbally acknowledged these results. Electronically Signed   By: Charlett Nose M.D.   On: 07/29/2016 15:06  Ct Angio Neck W And/or Wo Contrast  Result Date: 07/29/2016 CLINICAL DATA:  Right hand trembling. Right-sided mouth droop. Alzheimer's disease. EXAM: CT ANGIOGRAPHY HEAD AND NECK TECHNIQUE: Multidetector CT imaging of the head and neck was performed using the standard protocol during bolus administration of intravenous contrast. Multiplanar CT image reconstructions and MIPs were obtained to evaluate the vascular anatomy. Carotid stenosis measurements (when applicable) are obtained utilizing NASCET criteria, using the distal internal carotid  diameter as the denominator. CONTRAST:  75 cc Isovue 370 intravenous COMPARISON:  Noncontrast head CT from earlier today. FINDINGS: CTA NECK Aortic arch: 4 vessel branching pattern. Diffuse atherosclerotic calcification. Right carotid system: Scattered predominately calcified atherosclerotic plaque. No flow limiting stenosis or dissection. No plaque ulceration. Occasional artifact. Left carotid system: Intermittent artifact. Scattered atheromatous calcification, greatest at the bifurcation. Stenosis measures up to 25% at the proximal ICA. No evidence of dissection. Shallow excavation of plaque along the lateral proximal ICA. Vertebral arteries:No proximal subclavian stenosis on the right. Right vertebral artery dominance, sole contributor to the basilar. No flow limiting stenosis. No evidence of dissection when allowing for levels of artifact. Skeleton: No acute finding. Other neck: No incidental mass or adenopathy in the neck. CTA HEAD Anterior circulation: Scattered atheromatous calcifications greatest at the bilateral siphons. No major branch occlusion or flow limiting stenosis. Negative for aneurysm. Posterior circulation: Strongly dominant right vertebral artery. Fetal type right PCA. No major branch occlusion or flow limiting stenosis. Venous sinuses: Patent Anatomic variants: Fetal type PCA on the right. Left posterior communicating artery infundibulum. Delayed phase: Negative for mass or other abnormal enhancement. Atrophy and chronic microvascular disease as previously described. IMPRESSION: 1. No major vessel occlusion or flow limiting stenosis. 2. Proximal left ICA stenosis measuring ~25% with lateral wall plaque ulceration. Electronically Signed   By: Marnee Spring M.D.   On: 07/29/2016 18:08  Mr Brain Wo Contrast  Result Date: 07/30/2016 CLINICAL DATA:  Stroke.  Advanced dementia. EXAM: MRI HEAD WITHOUT CONTRAST MRA HEAD WITHOUT CONTRAST TECHNIQUE: Multiplanar, multiecho pulse sequences of the  brain and surrounding structures were obtained without intravenous contrast. Angiographic images of the head were obtained using MRA technique without contrast. COMPARISON:  Head neck CTA from yesterday FINDINGS: MRI HEAD FINDINGS Calvarium and upper cervical spine: Large partly seen cystic structure in the right paramedian maxilla, likely periapical granuloma. No surrounding inflammation. No focal marrow abnormality. Orbits: Negative. Sinuses and Mastoids: Clear. Brain: No acute or remote infarct, hemorrhage, hydrocephalus, or mass lesion. Negative for extra-axial collection. No evidence of large vessel occlusion. Atrophy with secondary ventriculomegaly. Mesial temporal volume loss is moderate to advanced, correlating with history of Alzheimer's disease. Mild to moderate for age chronic microvascular disease with confluent ischemic gliosis in the periventricular white matter. Less extensive microvascular change in the pons. MRA HEAD FINDINGS Dominant right vertebral artery. Left vertebral artery ends in PICA. Fetal type right PCA. No major branch occlusion or flow limiting stenosis. Siphon and MCA luminal irregularity correlating with atheromatous changes seen on comparison CTA. Outpouchings from the left supraclinoid carotid appear chronically shaped and favor 1-2 mm infundibula. IMPRESSION: 1. No acute finding, including infarct. 2. Atrophy in keeping with history of Alzheimer's disease. 3. Intracranial MRA without acute finding or flow limiting stenosis. Electronically Signed   By: Marnee Spring M.D.   On: 07/30/2016 08:11  Dg Chest Port 1 View  Result Date: 07/29/2016 CLINICAL DATA:  Altered mental status. EXAM: PORTABLE CHEST 1 VIEW COMPARISON:  05/04/2016 FINDINGS: Low lung volumes with bibasilar atelectasis. Heart is normal size. No effusions. No acute bony abnormality. IMPRESSION: Low lung volumes with bibasilar atelectasis. Electronically Signed   By: Charlett Nose M.D.   On: 07/29/2016 15:45  Mr  Maxine Glenn Head/brain ZO Cm  Result Date: 07/30/2016 CLINICAL DATA:  Stroke.  Advanced dementia. EXAM: MRI HEAD WITHOUT CONTRAST MRA HEAD WITHOUT CONTRAST TECHNIQUE: Multiplanar, multiecho pulse sequences of the brain and surrounding structures were obtained without intravenous contrast. Angiographic images of the head were obtained using MRA technique without contrast. COMPARISON:  Head neck CTA from yesterday FINDINGS: MRI HEAD FINDINGS Calvarium and upper cervical spine: Large partly seen cystic structure in the right paramedian maxilla, likely periapical granuloma. No surrounding inflammation. No focal marrow abnormality. Orbits: Negative. Sinuses and Mastoids: Clear. Brain: No acute or remote infarct, hemorrhage, hydrocephalus, or mass lesion. Negative for extra-axial collection. No evidence of large vessel occlusion. Atrophy with secondary ventriculomegaly. Mesial temporal volume loss is moderate to advanced, correlating with history of Alzheimer's disease. Mild to moderate for age chronic microvascular disease with confluent ischemic gliosis in the periventricular white matter. Less extensive microvascular change in the pons. MRA HEAD FINDINGS Dominant right vertebral artery. Left vertebral artery ends in PICA. Fetal type right PCA. No major branch occlusion or flow limiting stenosis. Siphon and MCA luminal irregularity correlating with atheromatous changes seen on comparison CTA. Outpouchings from the left supraclinoid carotid appear chronically shaped and favor 1-2 mm infundibula. IMPRESSION: 1. No acute finding, including infarct. 2. Atrophy in keeping with history of Alzheimer's disease. 3. Intracranial MRA without acute finding or flow limiting stenosis. Electronically Signed   By: Marnee Spring M.D.   On: 07/30/2016 08:11      Subjective: Patient is more awake and alert today, denies any chest pain or dyspnea, no nausea vomiting, tolerating by mouth diet adequately.  Discharge Exam: Vitals:    08/02/16 0646 08/02/16 0924  BP: (!) 141/67 (!) 147/83  Pulse: (!) 57 72  Resp: 20 20  Temp: 98.9 F (37.2 C) 98.9 F (37.2 C)   Vitals:   08/01/16 2052 08/02/16 0140 08/02/16 0646 08/02/16 0924  BP: 126/64 (!) 150/91 (!) 141/67 (!) 147/83  Pulse: 64 62 (!) 57 72  Resp: 20 20 20 20   Temp: 98.7 F (37.1 C) 98.6 F (37 C) 98.9 F (37.2 C) 98.9 F (37.2 C)  TempSrc: Oral Oral Axillary Oral  SpO2: 96% 97% 99% 99%  Weight:        General: Pt is alert, awake, Deconditioned. Cardiovascular: RRR, S1/S2 +, no rubs, no gallops Respiratory: CTA bilaterally, no wheezing, no rhonchi, poor history effort Abdominal: Soft, NT, ND, bowel sounds + Extremities: no edema, no cyanosis    The results of significant diagnostics from this hospitalization (including imaging, microbiology, ancillary and laboratory) are listed below for reference.     Microbiology: Recent Results (from the past 240 hour(s))  MRSA PCR Screening     Status: None   Collection Time: 07/29/16 11:45 PM  Result Value Ref Range Status   MRSA by PCR NEGATIVE NEGATIVE Final    Comment:        The GeneXpert MRSA Assay (FDA approved for NASAL specimens only), is one component of a comprehensive MRSA colonization surveillance program. It is not intended to diagnose MRSA infection nor to guide or monitor treatment for MRSA infections.      Labs: BNP (last 3 results) No results for input(s): BNP in the last 8760 hours. Basic Metabolic Panel:  Recent Labs Lab 07/29/16 1449 07/29/16 1454 07/29/16 2202 07/31/16 0207 08/01/16 0459 08/02/16 0536 08/02/16 1133  NA 144 149* 149* 140 139 139  --   K 2.6* 2.6* 3.1* 2.8* 3.4* 5.5* 4.1  CL 111 108 112* 111 110 109  --   CO2 26  --   --  23 22 21*  --   GLUCOSE 121* 116* 111* 141* 114* 121*  --   BUN 12 10 6 6 6 7   --   CREATININE 0.82 0.80 0.80 0.68 0.71 0.76  --   CALCIUM 9.8  --   --  9.3 9.6 10.0  --   MG 2.1  --   --   --  1.9  --   --    Liver Function  Tests:  Recent Labs Lab 07/29/16 1449 07/31/16 0207  AST 17  --   ALT 13*  --   ALKPHOS 88  --   BILITOT 0.3  --   PROT 7.3  --   ALBUMIN 3.7 2.9*   No results for input(s): LIPASE, AMYLASE in the last 168 hours. No results for input(s): AMMONIA in the last 168 hours. CBC:  Recent Labs Lab 07/29/16 1449 07/29/16 1454 07/29/16 2202  WBC 7.4  --   --   NEUTROABS 4.3  --   --   HGB 12.3 13.3 11.9*  HCT 38.0 39.0 35.0*  MCV 86.8  --   --   PLT 167  --   --    Cardiac Enzymes: No results for input(s): CKTOTAL, CKMB, CKMBINDEX, TROPONINI in the last 168 hours. BNP: Invalid input(s): POCBNP CBG:  Recent Labs Lab 08/01/16 2001 08/01/16 2353 08/02/16 0347 08/02/16 0740 08/02/16 1123  GLUCAP 142* 136* 113* 140* 151*   D-Dimer No results for input(s): DDIMER in the last 72 hours. Hgb A1c No results for input(s): HGBA1C in the last 72 hours. Lipid Profile No results for input(s): CHOL, HDL, LDLCALC, TRIG, CHOLHDL, LDLDIRECT in the  last 72 hours. Thyroid function studies No results for input(s): TSH, T4TOTAL, T3FREE, THYROIDAB in the last 72 hours.  Invalid input(s): FREET3 Anemia work up No results for input(s): VITAMINB12, FOLATE, FERRITIN, TIBC, IRON, RETICCTPCT in the last 72 hours. Urinalysis    Component Value Date/Time   COLORURINE YELLOW 07/31/2016 1706   APPEARANCEUR CLOUDY (A) 07/31/2016 1706   LABSPEC 1.013 07/31/2016 1706   PHURINE 7.5 07/31/2016 1706   GLUCOSEU NEGATIVE 07/31/2016 1706   HGBUR LARGE (A) 07/31/2016 1706   HGBUR negative 12/19/2010 1223   BILIRUBINUR NEGATIVE 07/31/2016 1706   KETONESUR 15 (A) 07/31/2016 1706   PROTEINUR 30 (A) 07/31/2016 1706   UROBILINOGEN 0.2 06/14/2015 2048   NITRITE NEGATIVE 07/31/2016 1706   LEUKOCYTESUR SMALL (A) 07/31/2016 1706   Sepsis Labs Invalid input(s): PROCALCITONIN,  WBC,  LACTICIDVEN Microbiology Recent Results (from the past 240 hour(s))  MRSA PCR Screening     Status: None   Collection  Time: 07/29/16 11:45 PM  Result Value Ref Range Status   MRSA by PCR NEGATIVE NEGATIVE Final    Comment:        The GeneXpert MRSA Assay (FDA approved for NASAL specimens only), is one component of a comprehensive MRSA colonization surveillance program. It is not intended to diagnose MRSA infection nor to guide or monitor treatment for MRSA infections.      Time coordinating discharge: 45 minutes  SIGNED:   Coralie Keens, MD  [facility] 08/02/2016, 1:48 PM Pager   If 7PM-7AM, please contact night-coverage [email or web address] Password [email or web address]

## 2016-08-02 NOTE — Care Management Important Message (Signed)
Important Message  Patient Details  Name: LATYSHA GUERRETTE MRN: [id number] Date of Birth: 1935-05-29   Medicare Important Message Given:  Yes    Bernadette Hoit 08/02/2016, 10:53 AM

## 2016-08-02 NOTE — Clinical Social Work Note (Signed)
Pt is ready for discharge today and will return to [facility] in [place]. Pt's daughter and husband are aware and in agreement with discharge plan. Hospice will be following. Pt's daughter chose [facility]. CSW made referral to Hospice. CSW updated MD and Palliative Care NP. [facility] will provide transportation. CSW is signing off as no further needs identified.   Dede Query, MSW, LCSW Clinical Social Worker  [phone]

## 2016-08-02 NOTE — Progress Notes (Signed)
1444 Stroke call 1446 Stroke beeper 1456 Exam started 1459 Exam finished 1500 Images sent to [facility] [address] [facility] Radiology called

## 2016-08-08 ENCOUNTER — Encounter (HOSPITAL_COMMUNITY): Payer: Self-pay | Admitting: Emergency Medicine

## 2016-08-08 ENCOUNTER — Emergency Department (HOSPITAL_COMMUNITY): Payer: Medicare Other

## 2016-08-08 ENCOUNTER — Emergency Department (HOSPITAL_COMMUNITY)
Admission: EM | Admit: 2016-08-08 | Discharge: 2016-08-08 | Disposition: A | Discharge status: Home / Self Care | Payer: Medicare Other | Attending: Emergency Medicine | Admitting: Emergency Medicine

## 2016-08-08 DIAGNOSIS — Y939 Activity, unspecified: Secondary | ICD-10-CM | POA: Insufficient documentation

## 2016-08-08 DIAGNOSIS — I10 Essential (primary) hypertension: Secondary | ICD-10-CM | POA: Diagnosis not present

## 2016-08-08 DIAGNOSIS — E119 Type 2 diabetes mellitus without complications: Secondary | ICD-10-CM | POA: Insufficient documentation

## 2016-08-08 DIAGNOSIS — S4991XA Unspecified injury of right shoulder and upper arm, initial encounter: Secondary | ICD-10-CM | POA: Diagnosis present

## 2016-08-08 DIAGNOSIS — S0081XA Abrasion of other part of head, initial encounter: Secondary | ICD-10-CM | POA: Diagnosis not present

## 2016-08-08 DIAGNOSIS — Z7982 Long term (current) use of aspirin: Secondary | ICD-10-CM | POA: Diagnosis not present

## 2016-08-08 DIAGNOSIS — Y929 Unspecified place or not applicable: Secondary | ICD-10-CM | POA: Diagnosis not present

## 2016-08-08 DIAGNOSIS — W19XXXA Unspecified fall, initial encounter: Secondary | ICD-10-CM | POA: Diagnosis not present

## 2016-08-08 DIAGNOSIS — S52501A Unspecified fracture of the lower end of right radius, initial encounter for closed fracture: Secondary | ICD-10-CM | POA: Insufficient documentation

## 2016-08-08 DIAGNOSIS — Z79899 Other long term (current) drug therapy: Secondary | ICD-10-CM | POA: Diagnosis not present

## 2016-08-08 DIAGNOSIS — Y999 Unspecified external cause status: Secondary | ICD-10-CM | POA: Insufficient documentation

## 2016-08-08 DIAGNOSIS — N390 Urinary tract infection, site not specified: Secondary | ICD-10-CM | POA: Diagnosis not present

## 2016-08-08 DIAGNOSIS — Z87891 Personal history of nicotine dependence: Secondary | ICD-10-CM | POA: Insufficient documentation

## 2016-08-08 DIAGNOSIS — E039 Hypothyroidism, unspecified: Secondary | ICD-10-CM | POA: Insufficient documentation

## 2016-08-08 DIAGNOSIS — F0390 Unspecified dementia without behavioral disturbance: Secondary | ICD-10-CM | POA: Diagnosis not present

## 2016-08-08 DIAGNOSIS — Z7984 Long term (current) use of oral hypoglycemic drugs: Secondary | ICD-10-CM | POA: Insufficient documentation

## 2016-08-08 LAB — COMPREHENSIVE METABOLIC PANEL
ALBUMIN: 3.5 g/dL (ref 3.5–5.0)
ALT: 19 U/L (ref 14–54)
AST: 18 U/L (ref 15–41)
Alkaline Phosphatase: 83 U/L (ref 38–126)
Anion gap: 5 (ref 5–15)
BUN: 6 mg/dL (ref 6–20)
CHLORIDE: 102 mmol/L (ref 101–111)
CO2: 27 mmol/L (ref 22–32)
Calcium: 9.1 mg/dL (ref 8.9–10.3)
Creatinine, Ser: 0.62 mg/dL (ref 0.44–1.00)
GFR calc Af Amer: >60 mL/min (ref >60)
Glucose, Bld: 163 mg/dL — ABNORMAL HIGH (ref 65–99)
POTASSIUM: 3.4 mmol/L — AB (ref 3.5–5.1)
Sodium: 134 mmol/L — ABNORMAL LOW (ref 135–145)
Total Bilirubin: 0.5 mg/dL (ref 0.3–1.2)
Total Protein: 6.9 g/dL (ref 6.5–8.1)

## 2016-08-08 LAB — URINALYSIS, ROUTINE W REFLEX MICROSCOPIC
Bilirubin Urine: NEGATIVE
GLUCOSE, UA: NEGATIVE mg/dL
Ketones, ur: 40 mg/dL — AB
LEUKOCYTES UA: NEGATIVE
Nitrite: NEGATIVE
PROTEIN: 30 mg/dL — AB
Specific Gravity, Urine: 1.015 (ref 1.005–1.030)
pH: 7 (ref 5.0–8.0)

## 2016-08-08 LAB — CBC WITH DIFFERENTIAL/PLATELET
BASOS ABS: 0 10*3/uL (ref 0.0–0.1)
BASOS PCT: 0 %
EOS ABS: 0.1 10*3/uL (ref 0.0–0.7)
EOS PCT: 0 %
HCT: 36.5 % (ref 36.0–46.0)
Hemoglobin: 12.2 g/dL (ref 12.0–15.0)
Lymphocytes Relative: 14 %
Lymphs Abs: 1.6 10*3/uL (ref 0.7–4.0)
MCH: 28.5 pg (ref 26.0–34.0)
MCHC: 33.4 g/dL (ref 30.0–36.0)
MCV: 85.3 fL (ref 78.0–100.0)
MONO ABS: 0.6 10*3/uL (ref 0.1–1.0)
Monocytes Relative: 5 %
Neutro Abs: 9.7 10*3/uL — ABNORMAL HIGH (ref 1.7–7.7)
Neutrophils Relative %: 81 %
PLATELETS: 189 10*3/uL (ref 150–400)
RBC: 4.28 MIL/uL (ref 3.87–5.11)
RDW: 13 % (ref 11.5–15.5)
WBC: 12 10*3/uL — ABNORMAL HIGH (ref 4.0–10.5)

## 2016-08-08 LAB — URINE MICROSCOPIC-ADD ON

## 2016-08-08 LAB — PHENYTOIN LEVEL, TOTAL: PHENYTOIN LVL: 5.6 ug/mL — AB (ref 10.0–20.0)

## 2016-08-08 MED ORDER — PHENYTOIN SODIUM EXTENDED 100 MG PO CAPS
300.0000 mg | ORAL_CAPSULE | Freq: Once | ORAL | Status: AC
  Administered 2016-08-08: 300 mg via ORAL
  Filled 2016-08-08: qty 3

## 2016-08-08 MED ORDER — DEXTROSE 5 % IV SOLN
1.0000 g | Freq: Once | INTRAVENOUS | Status: AC
  Administered 2016-08-08: 1 g via INTRAVENOUS
  Filled 2016-08-08: qty 10

## 2016-08-08 MED ORDER — POTASSIUM CHLORIDE CRYS ER 20 MEQ PO TBCR
40.0000 meq | EXTENDED_RELEASE_TABLET | Freq: Once | ORAL | Status: AC
  Administered 2016-08-08: 40 meq via ORAL
  Filled 2016-08-08: qty 2

## 2016-08-08 NOTE — ED Notes (Signed)
Pt returned from xray/CT.

## 2016-08-08 NOTE — Discharge Instructions (Signed)
Take the antibiotic as prescribed. Follow-up with Dr. Romeo AppleHarrison regarding her wrist fracture. Return to the ED if you develop new or worsening symptoms.

## 2016-08-08 NOTE — ED Notes (Signed)
Pt in x-ray/CT at this time.

## 2016-08-08 NOTE — ED Notes (Signed)
Spoke with Kia at [facility] in [place], states patient's fall was not witnessed. States "the first shift supervisor said the CNA heard the patient cry out and found her on the floor." States it is unknown if patient had LOC or how long patient had been lying in floor.

## 2016-08-08 NOTE — ED Triage Notes (Signed)
Patient from [facility] in [place], brought in by EMS for fall at 1330 today. Complaining of pain to right arm, right cheek, and nose. Denies LOC. Patient has swelling and redness noted to right cheek and bruising to right hand.

## 2016-08-08 NOTE — ED Provider Notes (Signed)
[facility]-EMERGENCY DEPT Provider Note   CSN: [id number] Arrival date & time: 08/08/16  1539  First Provider Contact:  First MD Initiated Contact with Patient 08/08/16 1546        History   Chief Complaint Chief Complaint  Patient presents with  . Fall  . Arm Pain  . Facial Injury    HPI [name] is a 81 y.o. female.  Level 5 caveat for dementia.  [facility] apparent [facility] that was unwitnessed. Staff heard her cry out and found her on the ground. She has abrasion swelling to her right cheek and right wrist. Patient does not recall the fall. She is oriented to person and place. She is not certain what happened. Recently admitted to the hospital and discharged 6 days ago after stay for altered mental status and apparent seizure activity. Was started on Dilantin. Patient complains of pain to her right cheek, right wrist. Denies chest pain denies shortness of breath denies abdominal pain. Denies nausea or vomiting.   The history is provided by the patient and the EMS personnel. The history is limited by the condition of the patient.  Fall   Arm Pain   Facial Injury    Past Medical History:  Diagnosis Date  . Alzheimer disease    per facility paperwork dated 05/04/2016  . Anxiety   . Dementia   . Diabetes mellitus   . Hypercholesteremia   . Hypertension   . Intracranial bleeding (HCC)    10 years ago  . Seizures (HCC)    approx 10 years when she had brain bleed  . Thyroid disease     Patient Active Problem List   Diagnosis Date Noted  . Right hip pain   . Palliative care encounter   . Encounter for hospice care discussion   . Partial symptomatic epilepsy with complex partial seizures, not intractable, without status epilepticus (HCC)   . Altered mental status 07/29/2016  . Hypokalemia 07/29/2016  . Displaced fracture of right femoral neck (HCC) 05/07/2016  . Hip fracture (HCC) 05/04/2016  . Closed right hip fracture (HCC) 05/04/2016  . Type 2  diabetes mellitus with hyperglycemia (HCC) 05/04/2016  . Dementia 05/04/2016  . Closed fracture of part of upper end of humerus 01/03/2015  . Hypothyroidism 12/19/2010  . DIABETES MELLITUS, TYPE II 12/19/2010  . DEMENTIA 12/19/2010  . ANXIETY 12/19/2010  . Essential hypertension 12/19/2010  . DIZZINESS 12/19/2010  . FATIGUE 12/19/2010  . HEADACHE 12/19/2010    Past Surgical History:  Procedure Laterality Date  . ABDOMINAL HYSTERECTOMY    . ANTERIOR APPROACH HEMI HIP ARTHROPLASTY Right 05/07/2016   Procedure: ANTERIOR APPROACH HEMI HIP ARTHROPLASTY VERSUS TOTAL HIP ARTHROPLASTY;  Surgeon: Samson FredericBrian Swinteck, MD;  Location: [facility] OR;  Service: Orthopedics;  Laterality: Right;  . CHOLECYSTECTOMY      OB History    No data available       Home Medications    Prior to Admission medications   Medication Sig Start Date End Date Taking? Authorizing Provider  acetaminophen (TYLENOL) 500 MG tablet Take 1 tablet (500 mg total) by mouth every 8 (eight) hours as needed for headache. 08/02/16   Mauricio Annett Gulaaniel Arrien, MD  amLODipine (NORVASC) 5 MG tablet Take 2 tablets (10 mg total) by mouth daily. 05/09/16   Shanker Levora DredgeM Ghimire, MD  Artificial Tear Solution (SOOTHE XP OP) Apply 1 drop to eye 2 (two) times daily.    Historical Provider, MD  aspirin 81 MG chewable tablet Chew 1  tablet (81 mg total) by mouth daily. 08/02/16   Mauricio Annett Gula, MD  cholecalciferol (VITAMIN D) 1000 UNITS tablet Take 1,000 Units by mouth daily.    Historical Provider, MD  citalopram (CELEXA) 20 MG tablet Take 20 mg by mouth daily.    Historical Provider, MD  feeding supplement, ENSURE ENLIVE, (ENSURE ENLIVE) LIQD Take 237 mLs by mouth 2 (two) times daily between meals. Patient not taking: Reported on 07/29/2016 05/09/16   Maretta Bees, MD  gabapentin (NEURONTIN) 100 MG capsule Take 1 capsule (100 mg total) by mouth at bedtime. 08/02/16   Mauricio Annett Gula, MD  levothyroxine (SYNTHROID, LEVOTHROID) 25 MCG tablet Take  25 mcg by mouth daily before breakfast.    Historical Provider, MD  lisinopril (PRINIVIL,ZESTRIL) 40 MG tablet Take 40 mg by mouth daily.    Historical Provider, MD  magnesium oxide (MAG-OX) 400 MG tablet Take 400 mg by mouth 2 (two) times daily.    Historical Provider, MD  Memantine HCl-Donepezil HCl (NAMZARIC) 28-10 MG CP24 Take 1 tablet by mouth daily.    Historical Provider, MD  metFORMIN (GLUCOPHAGE) 500 MG tablet Take 250 mg by mouth 2 (two) times daily with a meal.    Historical Provider, MD  metoprolol tartrate (LOPRESSOR) 25 MG tablet Take 25 mg by mouth 2 (two) times daily.    Historical Provider, MD  pantoprazole (PROTONIX) 40 MG tablet Take 1 tablet (40 mg total) by mouth daily. 05/09/16   Shanker Levora Dredge, MD  phenytoin (DILANTIN) 300 MG ER capsule Take 1 capsule (300 mg total) by mouth at bedtime. 08/02/16   Mauricio Annett Gula, MD  polyethylene glycol (MIRALAX / Ethelene Hal) packet Take 17 g by mouth daily. 05/09/16   Shanker Levora Dredge, MD    Family History Family History  Problem Relation Age of Onset  . Hypertension Other     Social History Social History  Substance Use Topics  . Smoking status: Former Games developer  . Smokeless tobacco: Former Neurosurgeon  . Alcohol use No     Allergies   Penicillins; Promethazine hcl; and Sulfonamide derivatives   Review of Systems Review of Systems  Unable to perform ROS: Dementia     Physical Exam Updated Vital Signs BP 176/75 (BP Location: Left Arm)   Pulse 65   Temp 98.2 F (36.8 C) (Oral)   Resp 17   Ht 5\' 1"  (1.549 m)   Wt 128 lb (58.1 kg)   SpO2 100%   BMI 24.19 kg/m   Physical Exam  Constitutional: She is oriented to person, place, and time. She appears well-developed and well-nourished. No distress.  Oriented to person and place  HENT:  Head: Normocephalic and atraumatic.  Right Ear: External ear normal.  Left Ear: External ear normal.  Mouth/Throat: Oropharynx is clear and moist. No oropharyngeal exudate.  Abrasion  and swelling to R cheek EOMI Midface stable  Eyes: EOM are normal. Pupils are equal, round, and reactive to light.  Neck: Normal range of motion.  No C spine tenderness  Cardiovascular: Normal rate, regular rhythm and normal heart sounds.   No murmur heard. Pulmonary/Chest: Effort normal. No respiratory distress.  Abdominal: Soft. There is no tenderness. There is no rebound and no guarding.  Musculoskeletal: She exhibits edema and tenderness.  TTP R dorsal wrist with edema and ecchymosis over hand.  Intact radial pulse\  FROM hips without pain  Neurological: She is alert and oriented to person, place, and time. No cranial nerve deficit.  Skin: Skin is  warm. Capillary refill takes less than 2 seconds.     ED Treatments / Results  Labs (all labs ordered are listed, but only abnormal results are displayed) Labs Reviewed  CBC WITH DIFFERENTIAL/PLATELET - Abnormal; Notable for the following:       Result Value   WBC 12.0 (*)    Neutro Abs 9.7 (*)    All other components within normal limits  COMPREHENSIVE METABOLIC PANEL - Abnormal; Notable for the following:    Sodium 134 (*)    Potassium 3.4 (*)    Glucose, Bld 163 (*)    All other components within normal limits  PHENYTOIN LEVEL, TOTAL - Abnormal; Notable for the following:    Phenytoin Lvl 5.6 (*)    All other components within normal limits  URINALYSIS, ROUTINE W REFLEX MICROSCOPIC (NOT AT [facility]) - Abnormal; Notable for the following:    APPearance HAZY (*)    Hgb urine dipstick TRACE (*)    Ketones, ur 40 (*)    Protein, ur 30 (*)    All other components within normal limits  URINE MICROSCOPIC-ADD ON - Abnormal; Notable for the following:    Squamous Epithelial / LPF 0-5 (*)    Bacteria, UA MANY (*)    All other components within normal limits  URINE CULTURE    EKG  EKG Interpretation  Date/Time:  Wednesday August 08 2016 15:40:57 EDT Ventricular Rate:  65 PR Interval:    QRS Duration: 87 QT Interval:  454 QTC  Calculation: 473 R Axis:   7 Text Interpretation:  Sinus rhythm Low voltage, precordial leads No significant change was found Confirmed by Manus Gunning  MD, Mystery Schrupp ([id number]) on 08/08/2016 4:10:23 PM       Radiology No results found.  Procedures Procedures (including critical care time)  Medications Ordered in ED Medications - No data to display   Initial Impression / Assessment and Plan / ED Course  I have reviewed the triage vital signs and the nursing notes.  Pertinent labs & imaging results that were available during my care of the patient were reviewed by me and considered in my medical decision making (see chart for details).  Clinical Course  Comment By Time  Labs show leukocytosis.Dilantin Level subtherapeutic. Glynn Octave, MD 08/09 1708   Unwitnessed fall with facial contusion and R wrist pain. Confused as per baseline and does not recall details of fall.  Nondisplaced distal radius fracture on Xray. Splint placed. Tmax 99 rectally.  WBC 12.  CT head, face, C spine negative. Additional dilantin dose given.  Suspect mechanical fall, though patient does not recall details. Seizure considered, but no incontinence or post ictal period.  Discussed the patient's power of attorney Gavin Pound. She states that she is confused at baseline and is mostly nonambulatory and uses a wheelchair. She has confusion at baseline. Gavin Pound not aware of any seizure activity since patient left hospital.  Wrist splinted, treat possible UTI, culture sent.  Appears stable to return to facility.  Final Clinical Impressions(s) / ED Diagnoses   Final diagnoses:  Fall, initial encounter  Distal radius fracture, right, closed, initial encounter  Urinary tract infection without hematuria, site unspecified    New Prescriptions New Prescriptions   No medications on file     Glynn Octave, MD 08/09/16 [phone]

## 2016-08-08 NOTE — ED Notes (Signed)
Pt attempted to stand three times, reports that she is unable to stand.

## 2016-08-08 NOTE — ED Notes (Signed)
EDP at bedside updating patient. 

## 2016-08-08 NOTE — ED Notes (Signed)
Pt given water to drink. Pt tolerating well at this time. 

## 2016-08-09 ENCOUNTER — Telehealth (HOSPITAL_COMMUNITY): Payer: Self-pay | Admitting: Emergency Medicine

## 2016-08-09 MED ORDER — CEPHALEXIN 500 MG PO CAPS: 500.0000 mg | ORAL_CAPSULE | Freq: Two times a day (BID) | ORAL | 0 refills | Status: DC

## 2016-08-09 NOTE — Telephone Encounter (Signed)
Patient did not receive prescription for keflex for UTI.  [facility].  [facility] [place] Keflex 500 mg BID x7days.

## 2016-08-11 LAB — URINE CULTURE: Culture: >=100000 — AB

## 2016-08-12 ENCOUNTER — Telehealth (HOSPITAL_BASED_OUTPATIENT_CLINIC_OR_DEPARTMENT_OTHER): Payer: Self-pay

## 2016-08-12 NOTE — Progress Notes (Signed)
ED Antimicrobial Stewardship Positive Culture Follow Up   Tara PerkingBeverly J Lloyd is an 81 y.o. female who [facility] with a chief complaint of  Chief Complaint  Patient presents with  . Fall  . Arm Pain  . Facial Injury    Recent Results (from the past 720 hour(s))  MRSA PCR Screening     Status: None   Collection Time: 07/29/16 11:45 PM  Result Value Ref Range Status   MRSA by PCR NEGATIVE NEGATIVE Final    Comment:        The GeneXpert MRSA Assay (FDA approved for NASAL specimens only), is one component of a comprehensive MRSA colonization surveillance program. It is not intended to diagnose MRSA infection nor to guide or monitor treatment for MRSA infections.   Urine culture     Status: Abnormal   Collection Time: 08/08/16  7:00 PM  Result Value Ref Range Status   Specimen Description URINE, CATHETERIZED  Final   Special Requests NONE  Final   Culture >=100,000 COLONIES/mL ESCHERICHIA COLI (A)  Final   Report Status 08/11/2016 FINAL  Final   Organism ID, Bacteria ESCHERICHIA COLI (A)  Final      Susceptibility   Escherichia coli - MIC*    AMPICILLIN >=32 RESISTANT Resistant     CEFAZOLIN <=4 SENSITIVE Sensitive     CEFTRIAXONE <=1 SENSITIVE Sensitive     CIPROFLOXACIN >=4 RESISTANT Resistant     GENTAMICIN <=1 SENSITIVE Sensitive     IMIPENEM <=0.25 SENSITIVE Sensitive     NITROFURANTOIN <=16 SENSITIVE Sensitive     TRIMETH/SULFA <=20 SENSITIVE Sensitive     AMPICILLIN/SULBACTAM 4 SENSITIVE Sensitive     PIP/TAZO <=4 SENSITIVE Sensitive     Extended ESBL NEGATIVE Sensitive     * >=100,000 COLONIES/mL ESCHERICHIA COLI    []  Treated with , organism resistant to prescribed antimicrobial [x]  Patient discharged originally without antimicrobial agent and treatment is now indicated  New antibiotic prescription: keflex 500mg  PO q12h for 7 days  ED Provider: Delfina Redwoodhris Lawyer   Ramonita Koenig M 08/12/2016, 8:55 AM Infectious Diseases Pharmacist Phone#  [phone]

## 2016-08-12 NOTE — Telephone Encounter (Signed)
Pt seen on 08/08/16 in ED at [facility]. D/c without Abx treatment, now with + urine culture: per note Pt had prescription called to Pharmacy for Keflex on 08/09/16 by Dr. Manus Gunningancour.  Same Abx that Pharmacy is now prescribing.

## 2016-08-13 ENCOUNTER — Telehealth: Payer: Self-pay | Admitting: Orthopedic Surgery

## 2016-08-13 NOTE — Telephone Encounter (Signed)
Call from [facility] [place] received today [facility] -- for problem of right radius fracture, [facility].  States patient is [place] of right hip (Dr Linna CapriceSwinteck).  States patient also just referred for Hospice. Please advise if to schedule here or to combine orthopaedic care with the surgeon who performed recent procedure.  Their ph# is [phone]

## 2016-08-13 NOTE — Telephone Encounter (Signed)
[place] 

## 2016-08-14 NOTE — Telephone Encounter (Signed)
Spoke with Aundra MilletMegan at [facility]; relayed Dr Harrison's response.

## 2016-09-06 ENCOUNTER — Emergency Department (HOSPITAL_COMMUNITY): Payer: Medicare Other

## 2016-09-06 ENCOUNTER — Encounter (HOSPITAL_COMMUNITY): Payer: Self-pay

## 2016-09-06 ENCOUNTER — Emergency Department (HOSPITAL_COMMUNITY)
Admission: EM | Admit: 2016-09-06 | Discharge: 2016-09-06 | Disposition: A | Discharge status: Home / Self Care | Payer: Medicare Other | Attending: Emergency Medicine | Admitting: Emergency Medicine

## 2016-09-06 DIAGNOSIS — Z7984 Long term (current) use of oral hypoglycemic drugs: Secondary | ICD-10-CM | POA: Diagnosis not present

## 2016-09-06 DIAGNOSIS — W19XXXA Unspecified fall, initial encounter: Secondary | ICD-10-CM | POA: Insufficient documentation

## 2016-09-06 DIAGNOSIS — Z7982 Long term (current) use of aspirin: Secondary | ICD-10-CM | POA: Insufficient documentation

## 2016-09-06 DIAGNOSIS — I10 Essential (primary) hypertension: Secondary | ICD-10-CM | POA: Diagnosis not present

## 2016-09-06 DIAGNOSIS — M25551 Pain in right hip: Secondary | ICD-10-CM | POA: Insufficient documentation

## 2016-09-06 DIAGNOSIS — E119 Type 2 diabetes mellitus without complications: Secondary | ICD-10-CM | POA: Diagnosis not present

## 2016-09-06 DIAGNOSIS — Y929 Unspecified place or not applicable: Secondary | ICD-10-CM | POA: Diagnosis not present

## 2016-09-06 DIAGNOSIS — Y939 Activity, unspecified: Secondary | ICD-10-CM | POA: Diagnosis not present

## 2016-09-06 DIAGNOSIS — G309 Alzheimer's disease, unspecified: Secondary | ICD-10-CM | POA: Diagnosis not present

## 2016-09-06 DIAGNOSIS — Z79899 Other long term (current) drug therapy: Secondary | ICD-10-CM | POA: Insufficient documentation

## 2016-09-06 DIAGNOSIS — Z87891 Personal history of nicotine dependence: Secondary | ICD-10-CM | POA: Diagnosis not present

## 2016-09-06 DIAGNOSIS — Y999 Unspecified external cause status: Secondary | ICD-10-CM | POA: Insufficient documentation

## 2016-09-06 NOTE — ED Notes (Signed)
[facility] given discharge report on patient.   Transportation for patient is being arranged at this time.

## 2016-09-06 NOTE — ED Provider Notes (Signed)
[facility]-EM[facility]Y DEPT Provider Note   CSN: [id number] Arrival date & time: 09/06/16  0702     History   Chief Complaint Chief Complaint  Patient presents with  . Fall    HPI [name] is a 81 y.o. female.  The history is provided by the patient, the EMS personnel and a caregiver. The history is limited by the condition of the patient (Hx dementia).  Fall   Pt was seen at 0710.  Per EMS, NH report and pt: Pt found on the floor at [facility] Pt c/o "hip pain" to staff. Pt has significant hx of dementia and currently denies any complaints.   Past Medical History:  Diagnosis Date  . Alzheimer disease    per facility paperwork dated 05/04/2016  . Anxiety   . Dementia   . Diabetes mellitus   . Hypercholesteremia   . Hypertension   . Intracranial bleeding (HCC)    10 years ago  . Seizures (HCC)    approx 10 years when she had brain bleed  . Thyroid disease     Patient Active Problem List   Diagnosis Date Noted  . Right hip pain   . Palliative care encounter   . Encounter for hospice care discussion   . Partial symptomatic epilepsy with complex partial seizures, not intractable, without status epilepticus (HCC)   . Altered mental status 07/29/2016  . Hypokalemia 07/29/2016  . Displaced fracture of right femoral neck (HCC) 05/07/2016  . Hip fracture (HCC) 05/04/2016  . Closed right hip fracture (HCC) 05/04/2016  . Type 2 diabetes mellitus with hyperglycemia (HCC) 05/04/2016  . Dementia 05/04/2016  . Closed fracture of part of upper end of humerus 01/03/2015  . Hypothyroidism 12/19/2010  . DIABETES MELLITUS, TYPE II 12/19/2010  . DEMENTIA 12/19/2010  . ANXIETY 12/19/2010  . Essential hypertension 12/19/2010  . DIZZINESS 12/19/2010  . FATIGUE 12/19/2010  . HEADACHE 12/19/2010    Past Surgical History:  Procedure Laterality Date  . ABDOMINAL HYSTERECTOMY    . ANTERIOR APPROACH HEMI HIP ARTHROPLASTY Right 05/07/2016   Procedure: ANTERIOR APPROACH HEMI HIP ARTHROPLASTY  VERSUS TOTAL HIP ARTHROPLASTY;  Surgeon: Samson Frederic, MD;  Location: [facility] OR;  Service: Orthopedics;  Laterality: Right;  . CHOLECYSTECTOMY         Home Medications    Prior to Admission medications   Medication Sig Start Date End Date Taking? Authorizing Provider  acetaminophen (TYLENOL) 500 MG tablet Take 1 tablet (500 mg total) by mouth every 8 (eight) hours as needed for headache. 08/02/16   Mauricio Annett Gula, MD  amLODipine (NORVASC) 5 MG tablet Take 2 tablets (10 mg total) by mouth daily. 05/09/16   Shanker Levora Dredge, MD  Artificial Tear Solution (SOOTHE XP OP) Apply 1 drop to eye 2 (two) times daily.    Historical Provider, MD  aspirin 81 MG chewable tablet Chew 1 tablet (81 mg total) by mouth daily. 08/02/16   Mauricio Annett Gula, MD  cephALEXin (KEFLEX) 500 MG capsule Take 1 capsule (500 mg total) by mouth 2 (two) times daily. 08/09/16   Glynn Octave, MD  cholecalciferol (VITAMIN D) 1000 UNITS tablet Take 1,000 Units by mouth daily.    Historical Provider, MD  citalopram (CELEXA) 20 MG tablet Take 20 mg by mouth daily.    Historical Provider, MD  feeding supplement, ENSURE ENLIVE, (ENSURE ENLIVE) LIQD Take 237 mLs by mouth 2 (two) times daily between meals. Patient not taking: Reported on 07/29/2016 05/09/16   Maretta Bees, MD  gabapentin (NEURONTIN) 100 MG capsule Take 1 capsule (100 mg total) by mouth at bedtime. 08/02/16   Mauricio Annett Gula, MD  levothyroxine (SYNTHROID, LEVOTHROID) 25 MCG tablet Take 25 mcg by mouth daily before breakfast.    Historical Provider, MD  lisinopril (PRINIVIL,ZESTRIL) 40 MG tablet Take 40 mg by mouth daily.    Historical Provider, MD  magnesium oxide (MAG-OX) 400 MG tablet Take 400 mg by mouth 2 (two) times daily.    Historical Provider, MD  Memantine HCl-Donepezil HCl (NAMZARIC) 28-10 MG CP24 Take 1 tablet by mouth daily.    Historical Provider, MD  metFORMIN (GLUCOPHAGE) 500 MG tablet Take 250 mg by mouth 2 (two) times daily with a  meal.    Historical Provider, MD  metoprolol tartrate (LOPRESSOR) 25 MG tablet Take 25 mg by mouth 2 (two) times daily.    Historical Provider, MD  pantoprazole (PROTONIX) 40 MG tablet Take 1 tablet (40 mg total) by mouth daily. 05/09/16   Shanker Levora Dredge, MD  phenytoin (DILANTIN) 300 MG ER capsule Take 1 capsule (300 mg total) by mouth at bedtime. 08/02/16   Mauricio Annett Gula, MD  polyethylene glycol (MIRALAX / Ethelene Hal) packet Take 17 g by mouth daily. 05/09/16   Shanker Levora Dredge, MD    Family History Family History  Problem Relation Age of Onset  . Hypertension Other     Social History Social History  Substance Use Topics  . Smoking status: Former Games developer  . Smokeless tobacco: Former Neurosurgeon  . Alcohol use No     Allergies   Penicillins; Promethazine hcl; and Sulfonamide derivatives   Review of Systems Review of Systems  Unable to perform ROS: Dementia     Physical Exam Updated Vital Signs BP 135/60   Pulse (!) 57   Temp 97.7 F (36.5 C) (Oral)   Resp 16   Wt 128 lb (58.1 kg)   SpO2 98%   BMI 24.19 kg/m   Physical Exam 0715: Physical examination: Vital signs and O2 SAT: Reviewed; Constitutional: Well developed, Well nourished, Well hydrated, In no acute distress; Head and Face: Normocephalic, Atraumatic; Eyes: EOMI, PERRL, No scleral icterus; ENMT: Mouth and pharynx normal, Left TM normal, Right TM normal, Mucous membranes moist; Neck: Supple, Trachea midline; Spine: No midline CS, TS, LS tenderness.; Cardiovascular: Regular rate and rhythm, No gallop; Respiratory: Breath sounds clear & equal bilaterally, No wheezes, Normal respiratory effort/excursion; Chest: Nontender, No deformity, Movement normal, No crepitus, No abrasions or ecchymosis.; Abdomen: Soft, Nontender, Nondistended, Normal bowel sounds, No abrasions or ecchymosis.; Genitourinary: No CVA tenderness;  Extremities: No deformity, Full range of motion major/large joints of bilat UE's and LE's without pain  or tenderness to palp, Neurovascularly intact, Pulses normal, No tenderness, No edema, Pelvis stable; Neuro: Awake, alert, confused per hx dementia. No facial droop. Speech clear. Moves all extremities spontaneously on stretcher without apparent gross focal motor deficits.; Skin: Color normal, Warm, Dry   ED Treatments / Results  Labs (all labs ordered are listed, but only abnormal results are displayed)   EKG  EKG Interpretation None       Radiology   Procedures Procedures (including critical care time)  Medications Ordered in ED Medications - No data to display   Initial Impression / Assessment and Plan / ED Course  I have reviewed the triage vital signs and the nursing notes.  Pertinent labs & imaging results that were available during my care of the patient were reviewed by me and considered in my medical decision  making (see chart for details).  MDM Reviewed: previous chart, nursing note and vitals Interpretation: x-ray and CT scan    Dg Hips Bilat With Pelvis Min 5 Views Result Date: 09/06/2016 CLINICAL DATA:  On witnessed fall. The patient is complaining of hip pain. Alzheimer's type dementia. EXAM: DG HIP (WITH OR WITHOUT PELVIS) 5+V BILAT COMPARISON:  None. FINDINGS: Right hip hemiarthroplasty is intact.  The right hip is located. The left hip is located. Joint space is preserved. There is no acute fracture. The pelvis is intact. Degenerative changes are present in the lower lumbar spine. IMPRESSION: 1. No acute abnormality. 2. Right hip hemiarthroplasty. Electronically Signed   By: Marin Roberts M.D.   On: 09/06/2016 08:21    Ct Head Wo Contrast Result Date: 09/06/2016 CLINICAL DATA:  Found on floor at nursing home, unwitnessed fall EXAM: CT HEAD WITHOUT CONTRAST CT CERVICAL SPINE WITHOUT CONTRAST TECHNIQUE: Multidetector CT imaging of the head and cervical spine was performed following the standard protocol without intravenous contrast. Multiplanar CT image  reconstructions of the cervical spine were also generated. COMPARISON:  08/08/2016 FINDINGS: CT HEAD FINDINGS Brain: No evidence of acute infarction, hemorrhage, hydrocephalus, extra-axial collection or mass lesion/mass effect. Diffuse atrophic and chronic white matter ischemic changes are again noted. Vascular: No hyperdense vessel or unexpected calcification. Skull: Normal. Negative for fracture or focal lesion. Sinuses/Orbits: No acute finding. CT CERVICAL SPINE FINDINGS Alignment: Well-aligned although there is loss of the normal cervical lordosis. This is stable from the recent exam. Skull base and vertebrae: Vertebral body height is well maintained. The craniocervical junction is within normal limits.Mild facet hypertrophic changes are noted Soft tissues and spinal canal: Within normal limits. Some carotid calcifications are seen. Disc levels: Disc space narrowing is noted at C5-6 with mild osteophytic changes. Upper chest: Within normal limits. IMPRESSION: CT of the head: Chronic atrophic and ischemic changes stable from the recent exam. No acute abnormality noted. CT of the cervical spine: Mild degenerative change without acute abnormality. Electronically Signed   By: Alcide Clever M.D.   On: 09/06/2016 08:38    Ct Cervical Spine Wo Contrast Result Date: 09/06/2016 CLINICAL DATA:  Found on floor at nursing home, unwitnessed fall EXAM: CT HEAD WITHOUT CONTRAST CT CERVICAL SPINE WITHOUT CONTRAST TECHNIQUE: Multidetector CT imaging of the head and cervical spine was performed following the standard protocol without intravenous contrast. Multiplanar CT image reconstructions of the cervical spine were also generated. COMPARISON:  08/08/2016 FINDINGS: CT HEAD FINDINGS Brain: No evidence of acute infarction, hemorrhage, hydrocephalus, extra-axial collection or mass lesion/mass effect. Diffuse atrophic and chronic white matter ischemic changes are again noted. Vascular: No hyperdense vessel or unexpected  calcification. Skull: Normal. Negative for fracture or focal lesion. Sinuses/Orbits: No acute finding. CT CERVICAL SPINE FINDINGS Alignment: Well-aligned although there is loss of the normal cervical lordosis. This is stable from the recent exam. Skull base and vertebrae: Vertebral body height is well maintained. The craniocervical junction is within normal limits.Mild facet hypertrophic changes are noted Soft tissues and spinal canal: Within normal limits. Some carotid calcifications are seen. Disc levels: Disc space narrowing is noted at C5-6 with mild osteophytic changes. Upper chest: Within normal limits. IMPRESSION: CT of the head: Chronic atrophic and ischemic changes stable from the recent exam. No acute abnormality noted. CT of the cervical spine: Mild degenerative change without acute abnormality. Electronically Signed   By: Alcide Clever M.D.   On: 09/06/2016 08:38    0850:  CT/XR reassuring. Will d/c back to [facility]  stable.     Final Clinical Impressions(s) / ED Diagnoses   Final diagnoses:  Fall    New Prescriptions New Prescriptions   No medications on file     Samuel JesterKathleen Jerrett Baldinger, DO 09/11/16 1627

## 2016-09-06 NOTE — ED Notes (Signed)
MD at bedside. 

## 2016-09-06 NOTE — ED Triage Notes (Signed)
[facility]  [facility].  Complaining of pain in her back and hurting all over.

## 2016-09-06 NOTE — Discharge Instructions (Signed)
Take your usual prescriptions as previously directed.  Call your regular medical doctor today to schedule a follow up appointment within the next 2 days.  Return to the Emergency Department immediately sooner if worsening.  ° °

## 2016-09-22 ENCOUNTER — Emergency Department (HOSPITAL_COMMUNITY)
Admission: EM | Admit: 2016-09-22 | Discharge: 2016-09-22 | Disposition: A | Discharge status: Home / Self Care | Payer: Medicare Other | Attending: Emergency Medicine | Admitting: Emergency Medicine

## 2016-09-22 ENCOUNTER — Encounter (HOSPITAL_COMMUNITY): Payer: Self-pay

## 2016-09-22 ENCOUNTER — Emergency Department (HOSPITAL_COMMUNITY): Payer: Medicare Other

## 2016-09-22 DIAGNOSIS — S32020A Wedge compression fracture of second lumbar vertebra, initial encounter for closed fracture: Secondary | ICD-10-CM | POA: Diagnosis not present

## 2016-09-22 DIAGNOSIS — Z7982 Long term (current) use of aspirin: Secondary | ICD-10-CM | POA: Diagnosis not present

## 2016-09-22 DIAGNOSIS — Z7984 Long term (current) use of oral hypoglycemic drugs: Secondary | ICD-10-CM | POA: Diagnosis not present

## 2016-09-22 DIAGNOSIS — E039 Hypothyroidism, unspecified: Secondary | ICD-10-CM | POA: Insufficient documentation

## 2016-09-22 DIAGNOSIS — S3992XA Unspecified injury of lower back, initial encounter: Secondary | ICD-10-CM | POA: Diagnosis present

## 2016-09-22 DIAGNOSIS — I10 Essential (primary) hypertension: Secondary | ICD-10-CM | POA: Diagnosis not present

## 2016-09-22 DIAGNOSIS — Y999 Unspecified external cause status: Secondary | ICD-10-CM | POA: Diagnosis not present

## 2016-09-22 DIAGNOSIS — Z87891 Personal history of nicotine dependence: Secondary | ICD-10-CM | POA: Diagnosis not present

## 2016-09-22 DIAGNOSIS — Y929 Unspecified place or not applicable: Secondary | ICD-10-CM | POA: Diagnosis not present

## 2016-09-22 DIAGNOSIS — W19XXXA Unspecified fall, initial encounter: Secondary | ICD-10-CM | POA: Diagnosis not present

## 2016-09-22 DIAGNOSIS — E119 Type 2 diabetes mellitus without complications: Secondary | ICD-10-CM | POA: Diagnosis not present

## 2016-09-22 DIAGNOSIS — Y939 Activity, unspecified: Secondary | ICD-10-CM | POA: Diagnosis not present

## 2016-09-22 DIAGNOSIS — Z79899 Other long term (current) drug therapy: Secondary | ICD-10-CM | POA: Insufficient documentation

## 2016-09-22 NOTE — ED Notes (Signed)
Report given to Charolette ForwardKia Matthews at [facility].  Pt will be transported back by [facility]>

## 2016-09-22 NOTE — Discharge Instructions (Signed)
X-rays of lower back and pelvis show a questionable minor lumbar 2 compression fracture. The hardware in her back at lumbar 2 and lumbar 3 is intact. Tylenol for pain. Ice pack as needed. Follow-up with her primary care doctor.

## 2016-09-22 NOTE — ED Notes (Signed)
Pt has a cast to right forearm on arrival to ED.

## 2016-09-22 NOTE — ED Triage Notes (Signed)
Pt complain of pain in lower back and hips from a unwitnessed fall today.

## 2016-09-22 NOTE — ED Provider Notes (Addendum)
[facility] DEPT Provider Note   CSN: [id number] Arrival date & time: 09/22/16  1001   By signing my name below, I, [name], attest that this documentation has been prepared under the direction and in the presence of Tara Hutching, MD . Electronically Signed: Christel Lloyd, Scribe. 09/22/2016. 11:23 AM.   History   Chief Complaint Chief Complaint  Patient presents with  . Back Pain    fall   LEVEL 5 CAVEAT DUE TO DEMENTIA   The history is provided by the patient. No language interpreter was used.   HPI Comments:  [name] is a 81 y.o. female with PMHx of Alzheimer's, dementia, DM, and seizures who presents to the Emergency Department complaining of lower back and hip pain which she sustained after an unwitnessed fall earlier this morning. No obvious head or neck injury or extremity trauma.  Past Medical History:  Diagnosis Date  . Alzheimer disease    per facility paperwork dated 05/04/2016  . Anxiety   . Dementia   . Diabetes mellitus   . Hypercholesteremia   . Hypertension   . Intracranial bleeding (HCC)    10 years ago  . Seizures (HCC)    approx 10 years when she had brain bleed  . Thyroid disease     Patient Active Problem List   Diagnosis Date Noted  . Right hip pain   . Palliative care encounter   . Encounter for hospice care discussion   . Partial symptomatic epilepsy with complex partial seizures, not intractable, without status epilepticus (HCC)   . Altered mental status 07/29/2016  . Hypokalemia 07/29/2016  . Displaced fracture of right femoral neck (HCC) 05/07/2016  . Hip fracture (HCC) 05/04/2016  . Closed right hip fracture (HCC) 05/04/2016  . Type 2 diabetes mellitus with hyperglycemia (HCC) 05/04/2016  . Dementia 05/04/2016  . Closed fracture of part of upper end of humerus 01/03/2015  . Hypothyroidism 12/19/2010  . DIABETES MELLITUS, TYPE II 12/19/2010  . DEMENTIA 12/19/2010  . ANXIETY 12/19/2010  . Essential hypertension  12/19/2010  . DIZZINESS 12/19/2010  . FATIGUE 12/19/2010  . HEADACHE 12/19/2010    Past Surgical History:  Procedure Laterality Date  . ABDOMINAL HYSTERECTOMY    . ANTERIOR APPROACH HEMI HIP ARTHROPLASTY Right 05/07/2016   Procedure: ANTERIOR APPROACH HEMI HIP ARTHROPLASTY VERSUS TOTAL HIP ARTHROPLASTY;  Surgeon: Samson Frederic, MD;  Location: [facility] OR;  Service: Orthopedics;  Laterality: Right;  . CHOLECYSTECTOMY      OB History    No data available       Home Medications    Prior to Admission medications   Medication Sig Start Date End Date Taking? Authorizing Provider  acetaminophen (TYLENOL) 500 MG tablet Take 1 tablet (500 mg total) by mouth every 8 (eight) hours as needed for headache. Patient taking differently: Take 500 mg by mouth every 8 (eight) hours.  08/02/16  Yes Mauricio Annett Gula, MD  amLODipine (NORVASC) 5 MG tablet Take 2 tablets (10 mg total) by mouth daily. 05/09/16  Yes Shanker Levora Dredge, MD  Artificial Tear Solution (SOOTHE XP OP) Apply 1 drop to eye 2 (two) times daily.   Yes Historical Provider, MD  aspirin 81 MG chewable tablet Chew 1 tablet (81 mg total) by mouth daily. 08/02/16  Yes Mauricio Annett Gula, MD  citalopram (CELEXA) 20 MG tablet Take 20 mg by mouth daily.   Yes Historical Provider, MD  gabapentin (NEURONTIN) 300 MG capsule Take 600 mg by mouth 2 (two) times daily. 8  AM , 2 PM AND 8 PM.   Yes Historical Provider, MD  levothyroxine (SYNTHROID, LEVOTHROID) 25 MCG tablet Take 25 mcg by mouth daily before breakfast.   Yes Historical Provider, MD  lisinopril (PRINIVIL,ZESTRIL) 40 MG tablet Take 40 mg by mouth daily.   Yes Historical Provider, MD  LORazepam (ATIVAN) 2 MG/ML concentrated solution Take 0.5 mg by mouth 2 (two) times daily.   Yes Historical Provider, MD  Memantine HCl-Donepezil HCl (NAMZARIC) 28-10 MG CP24 Take 1 tablet by mouth daily.   Yes Historical Provider, MD  metFORMIN (GLUCOPHAGE) 500 MG tablet Take 250 mg by mouth 2 (two) times  daily with a meal.   Yes Historical Provider, MD  metoprolol tartrate (LOPRESSOR) 25 MG tablet Take 25 mg by mouth 2 (two) times daily.   Yes Historical Provider, MD  phenytoin (DILANTIN) 300 MG ER capsule Take 1 capsule (300 mg total) by mouth at bedtime. 08/02/16  Yes Mauricio Annett Gula, MD  polyethylene glycol [facility] / GLYCOLAX) packet Take 17 g by mouth daily. 05/09/16  Yes Shanker Levora Dredge, MD  cephALEXin (KEFLEX) 500 MG capsule Take 1 capsule (500 mg total) by mouth 2 (two) times daily. Patient not taking: Reported on 09/22/2016 08/09/16   Glynn Octave, MD  feeding supplement, ENSURE ENLIVE, (ENSURE ENLIVE) LIQD Take 237 mLs by mouth 2 (two) times daily between meals. Patient not taking: Reported on 09/22/2016 05/09/16   Maretta Bees, MD  gabapentin (NEURONTIN) 100 MG capsule Take 1 capsule (100 mg total) by mouth at bedtime. Patient not taking: Reported on 09/22/2016 08/02/16   Coralie Keens, MD  pantoprazole (PROTONIX) 40 MG tablet Take 1 tablet (40 mg total) by mouth daily. Patient not taking: Reported on 09/22/2016 05/09/16   Maretta Bees, MD    Family History Family History  Problem Relation Age of Onset  . Hypertension Other     Social History Social History  Substance Use Topics  . Smoking status: Former Games developer  . Smokeless tobacco: Former Neurosurgeon  . Alcohol use No     Allergies   Penicillins; Promethazine hcl; and Sulfonamide derivatives   Review of Systems Review of Systems  Reason unable to perform ROS: Dementia.     Physical Exam Updated Vital Signs BP 128/60   Pulse (!) 59   Temp 97.9 F (36.6 C) (Oral)   Resp 12   Ht 5\' 4"  (1.626 m)   Wt 135 lb (61.2 kg)   SpO2 97%   BMI 23.17 kg/m   Physical Exam  Constitutional: She is oriented to person, place, and time.  Demented, no acute distress  HENT:  Head: Normocephalic and atraumatic.  Eyes: Conjunctivae are normal.  Neck: Neck supple.  Cardiovascular: Normal rate and regular  rhythm.   Pulmonary/Chest: Effort normal and breath sounds normal.  Abdominal: Soft. Bowel sounds are normal.  Musculoskeletal:  Questionable bilateral hip tenderness.  No obvious lower back tenderness  Neurological: She is alert and oriented to person, place, and time.  Skin: Skin is warm and dry.  Psychiatric: She has a normal mood and affect. Her behavior is normal.  Nursing note and vitals reviewed.    ED Treatments / Results  DIAGNOSTIC STUDIES:  Oxygen Saturation is 98% on RA, normal by my interpretation.    COORDINATION OF CARE:  11:23 AM Will order an x-ray. Discussed treatment plan with pt at bedside and pt agreed to plan.   Labs (all labs ordered are listed, but only abnormal results are displayed) Labs  Reviewed - No data to display  EKG  EKG Interpretation None       Radiology Dg Lumbar Spine Complete  Result Date: 09/22/2016 CLINICAL DATA:  Pt states she fell today and is c/o of lower back and bilateral hip pain. Pt has dementia and wasn't unable to explain where or how she fell. Pt has right hip replacement. EXAM: LUMBAR SPINE - COMPLETE 4+ VIEW COMPARISON:  08/29/2015 FINDINGS: There is mild depression of the upper endplate of L2, new since the prior radiographs. No other fractures.  No spondylolisthesis. Bilateral pedicle screws and interconnecting rods at L2 and L3 are well-seated, and stable. There is a well-centered intervertebral cage maintaining disc height at the L2-L3 level, also stable. Mild to moderate loss of disc height noted at L4-L5 with small endplate spurs. Remaining disc spaces are relatively well preserved. Bones are demineralized. There are calcifications along the abdominal aorta, stable. IMPRESSION: 1. Mild depression of the upper endplate of L2 new from the prior radiographs. This may reflect an acute compression fracture. 2. No other evidence of an acute abnormality. 3. L2-L3 posterior lumbar spine fusion hardware is well-seated and stable.  Electronically Signed   By: Amie Portlandavid  Ormond M.D.   On: 09/22/2016 12:28   Dg Pelvis 1-2 Views  Result Date: 09/22/2016 CLINICAL DATA:  Pt states she fell today and is c/o of lower back and bilateral hip pain. Pt has dementia and wasn't unable to explain where or how she fell. Pt has right hip replacement. EXAM: PELVIS - 1-2 VIEW COMPARISON:  08/08/2016 FINDINGS: No fracture. Right hip hemi arthropathy is well-seated and aligned. Left hip joint, SI joints and pubic symphysis are normally spaced and aligned. No bone lesion.  Bones are diffusely demineralized. Soft tissues are unremarkable. IMPRESSION: No fracture, dislocation or acute finding. Electronically Signed   By: Amie Portlandavid  Ormond M.D.   On: 09/22/2016 12:26    Procedures Procedures (including critical care time)  Medications Ordered in ED Medications - No data to display   Initial Impression / Assessment and Plan / ED Course  I have reviewed the triage vital signs and the nursing notes.  Pertinent labs & imaging results that were available during my care of the patient were reviewed by me and considered in my medical decision making (see chart for details).  Clinical Course    Patient is in no acute distress. Plain films reviewed. No obvious tenderness in the L2 area. Return prn.  Final Clinical Impressions(s) / ED Diagnoses   Final diagnoses:  Fall, initial encounter  Compression fracture of L2 lumbar vertebra, closed, initial encounter [facility](HCC)    New Prescriptions New Prescriptions   No medications on file  I personally performed the services described in this documentation, which was scribed in my presence. The recorded information has been reviewed and is accurate.      Tara HutchingBrian Keyari Kleeman, MD 09/22/16 1519    Tara HutchingBrian Mateo Overbeck, MD 09/22/16 [phone]

## 2016-11-12 ENCOUNTER — Emergency Department (HOSPITAL_COMMUNITY)
Admission: EM | Admit: 2016-11-12 | Discharge: 2016-11-12 | Disposition: A | Discharge status: Home / Self Care | Payer: Medicare Other | Attending: Emergency Medicine | Admitting: Emergency Medicine

## 2016-11-12 ENCOUNTER — Encounter (HOSPITAL_COMMUNITY): Payer: Self-pay | Admitting: *Deleted

## 2016-11-12 ENCOUNTER — Emergency Department (HOSPITAL_COMMUNITY): Payer: Medicare Other

## 2016-11-12 DIAGNOSIS — Z7982 Long term (current) use of aspirin: Secondary | ICD-10-CM | POA: Diagnosis not present

## 2016-11-12 DIAGNOSIS — E119 Type 2 diabetes mellitus without complications: Secondary | ICD-10-CM | POA: Diagnosis not present

## 2016-11-12 DIAGNOSIS — F0390 Unspecified dementia without behavioral disturbance: Secondary | ICD-10-CM | POA: Diagnosis not present

## 2016-11-12 DIAGNOSIS — Y939 Activity, unspecified: Secondary | ICD-10-CM | POA: Insufficient documentation

## 2016-11-12 DIAGNOSIS — I10 Essential (primary) hypertension: Secondary | ICD-10-CM | POA: Diagnosis not present

## 2016-11-12 DIAGNOSIS — Z7984 Long term (current) use of oral hypoglycemic drugs: Secondary | ICD-10-CM | POA: Insufficient documentation

## 2016-11-12 DIAGNOSIS — Z79899 Other long term (current) drug therapy: Secondary | ICD-10-CM | POA: Diagnosis not present

## 2016-11-12 DIAGNOSIS — S7001XA Contusion of right hip, initial encounter: Secondary | ICD-10-CM | POA: Diagnosis not present

## 2016-11-12 DIAGNOSIS — W19XXXA Unspecified fall, initial encounter: Secondary | ICD-10-CM | POA: Diagnosis not present

## 2016-11-12 DIAGNOSIS — Y929 Unspecified place or not applicable: Secondary | ICD-10-CM | POA: Insufficient documentation

## 2016-11-12 DIAGNOSIS — Y999 Unspecified external cause status: Secondary | ICD-10-CM | POA: Insufficient documentation

## 2016-11-12 DIAGNOSIS — S79911A Unspecified injury of right hip, initial encounter: Secondary | ICD-10-CM | POA: Diagnosis present

## 2016-11-12 DIAGNOSIS — Z87891 Personal history of nicotine dependence: Secondary | ICD-10-CM | POA: Diagnosis not present

## 2016-11-12 NOTE — ED Provider Notes (Signed)
[facility]-EM[facility] DEPT Provider Note   CSN: [id number] Arrival date & time: 11/12/16  [id number]     History   Chief Complaint Chief Complaint  Patient presents with  . Fall    HPI [name] is a 81 y.o. female.  Patient presents to the emergency department for evaluation after a fall. Patient had a number Nursing home. Patient was found sitting on the floor. Circumstances are unclear. She does not have any contusions noted, complaining of "pain all over". EMS report that she has specifically complained of pain in the right hip. She seems to have pain with moving the hip. Level V Caveat due to dementia.      Past Medical History:  Diagnosis Date  . Alzheimer disease    per facility paperwork dated 05/04/2016  . Anxiety   . Dementia   . Diabetes mellitus   . Hypercholesteremia   . Hypertension   . Intracranial bleeding (HCC)    10 years ago  . Seizures (HCC)    approx 10 years when she had brain bleed  . Thyroid disease     Patient Active Problem List   Diagnosis Date Noted  . Right hip pain   . Palliative care encounter   . Encounter for hospice care discussion   . Partial symptomatic epilepsy with complex partial seizures, not intractable, without status epilepticus (HCC)   . Altered mental status 07/29/2016  . Hypokalemia 07/29/2016  . Displaced fracture of right femoral neck (HCC) 05/07/2016  . Hip fracture (HCC) 05/04/2016  . Closed right hip fracture (HCC) 05/04/2016  . Type 2 diabetes mellitus with hyperglycemia (HCC) 05/04/2016  . Dementia 05/04/2016  . Closed fracture of part of upper end of humerus 01/03/2015  . Hypothyroidism 12/19/2010  . DIABETES MELLITUS, TYPE II 12/19/2010  . DEMENTIA 12/19/2010  . ANXIETY 12/19/2010  . Essential hypertension 12/19/2010  . DIZZINESS 12/19/2010  . FATIGUE 12/19/2010  . HEADACHE 12/19/2010    Past Surgical History:  Procedure Laterality Date  . ABDOMINAL HYSTERECTOMY    . ANTERIOR APPROACH HEMI HIP  ARTHROPLASTY Right 05/07/2016   Procedure: ANTERIOR APPROACH HEMI HIP ARTHROPLASTY VERSUS TOTAL HIP ARTHROPLASTY;  Surgeon: Samson FredericBrian Swinteck, MD;  Location: [facility] OR;  Service: Orthopedics;  Laterality: Right;  . CHOLECYSTECTOMY      OB History    No data available       Home Medications    Prior to Admission medications   Medication Sig Start Date End Date Taking? Authorizing Provider  acetaminophen (TYLENOL) 500 MG tablet Take 1 tablet (500 mg total) by mouth every 8 (eight) hours as needed for headache. Patient taking differently: Take 500 mg by mouth every 8 (eight) hours.  08/02/16   Mauricio Annett Gulaaniel Arrien, MD  amLODipine (NORVASC) 5 MG tablet Take 2 tablets (10 mg total) by mouth daily. 05/09/16   Shanker Levora DredgeM Ghimire, MD  Artificial Tear Solution (SOOTHE XP OP) Apply 1 drop to eye 2 (two) times daily.    Historical Provider, MD  aspirin 81 MG chewable tablet Chew 1 tablet (81 mg total) by mouth daily. 08/02/16   Mauricio Annett Gulaaniel Arrien, MD  cephALEXin (KEFLEX) 500 MG capsule Take 1 capsule (500 mg total) by mouth 2 (two) times daily. Patient not taking: Reported on 09/22/2016 08/09/16   Glynn OctaveStephen Rancour, MD  citalopram (CELEXA) 20 MG tablet Take 20 mg by mouth daily.    Historical Provider, MD  feeding supplement, ENSURE ENLIVE, (ENSURE ENLIVE) LIQD Take 237 mLs by mouth 2 (two) times daily  between meals. Patient not taking: Reported on 09/22/2016 05/09/16   Maretta Bees, MD  gabapentin (NEURONTIN) 100 MG capsule Take 1 capsule (100 mg total) by mouth at bedtime. Patient not taking: Reported on 09/22/2016 08/02/16   Coralie Keens, MD  gabapentin (NEURONTIN) 300 MG capsule Take 600 mg by mouth 2 (two) times daily. 8 AM , 2 PM AND 8 PM.    Historical Provider, MD  levothyroxine (SYNTHROID, LEVOTHROID) 25 MCG tablet Take 25 mcg by mouth daily before breakfast.    Historical Provider, MD  lisinopril (PRINIVIL,ZESTRIL) 40 MG tablet Take 40 mg by mouth daily.    Historical Provider, MD    LORazepam (ATIVAN) 2 MG/ML concentrated solution Take 0.5 mg by mouth 2 (two) times daily.    Historical Provider, MD  Memantine HCl-Donepezil HCl (NAMZARIC) 28-10 MG CP24 Take 1 tablet by mouth daily.    Historical Provider, MD  metFORMIN (GLUCOPHAGE) 500 MG tablet Take 250 mg by mouth 2 (two) times daily with a meal.    Historical Provider, MD  metoprolol tartrate (LOPRESSOR) 25 MG tablet Take 25 mg by mouth 2 (two) times daily.    Historical Provider, MD  pantoprazole (PROTONIX) 40 MG tablet Take 1 tablet (40 mg total) by mouth daily. Patient not taking: Reported on 09/22/2016 05/09/16   Maretta Bees, MD  phenytoin (DILANTIN) 300 MG ER capsule Take 1 capsule (300 mg total) by mouth at bedtime. 08/02/16   Mauricio Annett Gula, MD  polyethylene glycol (MIRALAX / Ethelene Hal) packet Take 17 g by mouth daily. 05/09/16   Shanker Levora Dredge, MD    Family History Family History  Problem Relation Age of Onset  . Hypertension Other     Social History Social History  Substance Use Topics  . Smoking status: Former Games developer  . Smokeless tobacco: Former Neurosurgeon  . Alcohol use No     Allergies   Penicillins; Promethazine hcl; and Sulfonamide derivatives   Review of Systems Review of Systems  Unable to perform ROS: Dementia     Physical Exam Updated Vital Signs BP 151/62 (BP Location: Left Arm)   Pulse 64   Temp 98.2 F (36.8 C) (Oral)   Resp 16   Ht 5' (1.524 m)   Wt 128 lb (58.1 kg)   SpO2 100%   BMI 25.00 kg/m   Physical Exam  Constitutional: She is oriented to person, place, and time. She appears well-developed and well-nourished. No distress.  HENT:  Head: Normocephalic and atraumatic.  Right Ear: Hearing normal.  Left Ear: Hearing normal.  Nose: Nose normal.  Mouth/Throat: Oropharynx is clear and moist and mucous membranes are normal.  Eyes: Conjunctivae and EOM are normal. Pupils are equal, round, and reactive to light.  Neck: Normal range of motion. Neck supple.   Cardiovascular: Regular rhythm, S1 normal and S2 normal.  Exam reveals no gallop and no friction rub.   No murmur heard. Pulmonary/Chest: Effort normal and breath sounds normal. No respiratory distress. She exhibits no tenderness.  Abdominal: Soft. Normal appearance and bowel sounds are normal. There is no hepatosplenomegaly. There is no tenderness. There is no rebound, no guarding, no tenderness at McBurney's point and negative Murphy's sign. No hernia.  Musculoskeletal:       Right hip: She exhibits decreased range of motion. She exhibits no deformity.  Neurological: She is alert and oriented to person, place, and time. She has normal strength. No cranial nerve deficit or sensory deficit. Coordination normal. GCS eye subscore is 4.  GCS verbal subscore is 5. GCS motor subscore is 6.  Skin: Skin is warm, dry and intact. No rash noted. No cyanosis.  Psychiatric: She has a normal mood and affect. Her speech is normal and behavior is normal. Thought content normal.  Nursing note and vitals reviewed.    ED Treatments / Results  Labs (all labs ordered are listed, but only abnormal results are displayed) Labs Reviewed - No data to display  EKG  EKG Interpretation None       Radiology Dg Hip Unilat W Or Wo Pelvis 2-3 Views Right  Result Date: 11/12/2016 CLINICAL DATA:  Found on floor.  Presumed unwitnessed fall. EXAM: DG HIP (WITH OR WITHOUT PELVIS) 2-3V RIGHT COMPARISON:  None. FINDINGS: There are intact appearances of the bipolar right hip hemiarthroplasty. There is no evidence of acute fracture. There is no dislocation. Sacroiliac joints and pubic symphysis appear intact. IMPRESSION: Negative. Electronically Signed   By: Ellery Plunkaniel R Mitchell M.D.   On: 11/12/2016 04:40    Procedures Procedures (including critical care time)  Medications Ordered in ED Medications - No data to display   Initial Impression / Assessment and Plan / ED Course  I have reviewed the triage vital signs and  the nursing notes.  Pertinent labs & imaging results that were available during my care of the patient were reviewed by me and considered in my medical decision making (see chart for details).  Clinical Course    Sent to the ER for evaluation after being found on the floor. No evidence of laceration, abrasion, contusion, swelling, bruising of the head. Neck and back are nontender. Examination of upper extremities reveals full range of motion without swelling, signs of injury, normal range of motion. Patient seems to be favoring right hip. She is holding it flexed and cries out when the hip is moved. X-ray of right hip, however, was negative. Left hip was nontender, normal range of motion.  Final Clinical Impressions(s) / ED Diagnoses   Final diagnoses:  Contusion of right hip, initial encounter    New Prescriptions New Prescriptions   No medications on file     Gilda Creasehristopher J Jonalyn Sedlak, MD 11/12/16 [phone]

## 2016-11-12 NOTE — ED Triage Notes (Signed)
Pt brought in by [facility] for c/o fall; staff report to ems that they found pt in floor; pt is c/o right hip pain

## 2016-12-06 ENCOUNTER — Encounter (HOSPITAL_COMMUNITY): Payer: Self-pay

## 2016-12-06 ENCOUNTER — Emergency Department (HOSPITAL_COMMUNITY): Payer: Medicare Other

## 2016-12-06 ENCOUNTER — Inpatient Hospital Stay (HOSPITAL_COMMUNITY)
Admission: EM | Admit: 2016-12-06 | Discharge: 2016-12-10 | DRG: 481 | Disposition: A | Discharge status: Skilled Nursing Facility | Payer: Medicare Other | Attending: Family Medicine | Admitting: Family Medicine

## 2016-12-06 DIAGNOSIS — Z66 Do not resuscitate: Secondary | ICD-10-CM | POA: Diagnosis present

## 2016-12-06 DIAGNOSIS — G309 Alzheimer's disease, unspecified: Secondary | ICD-10-CM | POA: Diagnosis present

## 2016-12-06 DIAGNOSIS — Z87891 Personal history of nicotine dependence: Secondary | ICD-10-CM

## 2016-12-06 DIAGNOSIS — F419 Anxiety disorder, unspecified: Secondary | ICD-10-CM | POA: Diagnosis present

## 2016-12-06 DIAGNOSIS — Z7984 Long term (current) use of oral hypoglycemic drugs: Secondary | ICD-10-CM | POA: Diagnosis not present

## 2016-12-06 DIAGNOSIS — E785 Hyperlipidemia, unspecified: Secondary | ICD-10-CM | POA: Diagnosis present

## 2016-12-06 DIAGNOSIS — M25552 Pain in left hip: Secondary | ICD-10-CM

## 2016-12-06 DIAGNOSIS — Z8679 Personal history of other diseases of the circulatory system: Secondary | ICD-10-CM

## 2016-12-06 DIAGNOSIS — G40209 Localization-related (focal) (partial) symptomatic epilepsy and epileptic syndromes with complex partial seizures, not intractable, without status epilepticus: Secondary | ICD-10-CM | POA: Diagnosis present

## 2016-12-06 DIAGNOSIS — S72012A Unspecified intracapsular fracture of left femur, initial encounter for closed fracture: Secondary | ICD-10-CM | POA: Diagnosis present

## 2016-12-06 DIAGNOSIS — Z8249 Family history of ischemic heart disease and other diseases of the circulatory system: Secondary | ICD-10-CM | POA: Diagnosis not present

## 2016-12-06 DIAGNOSIS — E039 Hypothyroidism, unspecified: Secondary | ICD-10-CM | POA: Diagnosis present

## 2016-12-06 DIAGNOSIS — Z888 Allergy status to other drugs, medicaments and biological substances status: Secondary | ICD-10-CM

## 2016-12-06 DIAGNOSIS — E876 Hypokalemia: Secondary | ICD-10-CM | POA: Diagnosis not present

## 2016-12-06 DIAGNOSIS — F0390 Unspecified dementia without behavioral disturbance: Secondary | ICD-10-CM | POA: Diagnosis not present

## 2016-12-06 DIAGNOSIS — Z880 Allergy status to penicillin: Secondary | ICD-10-CM | POA: Diagnosis not present

## 2016-12-06 DIAGNOSIS — Z79899 Other long term (current) drug therapy: Secondary | ICD-10-CM | POA: Diagnosis not present

## 2016-12-06 DIAGNOSIS — D649 Anemia, unspecified: Secondary | ICD-10-CM | POA: Diagnosis present

## 2016-12-06 DIAGNOSIS — F0280 Dementia in other diseases classified elsewhere without behavioral disturbance: Secondary | ICD-10-CM | POA: Diagnosis present

## 2016-12-06 DIAGNOSIS — R262 Difficulty in walking, not elsewhere classified: Secondary | ICD-10-CM

## 2016-12-06 DIAGNOSIS — Z96641 Presence of right artificial hip joint: Secondary | ICD-10-CM | POA: Diagnosis present

## 2016-12-06 DIAGNOSIS — I10 Essential (primary) hypertension: Secondary | ICD-10-CM | POA: Diagnosis present

## 2016-12-06 DIAGNOSIS — Z7982 Long term (current) use of aspirin: Secondary | ICD-10-CM

## 2016-12-06 DIAGNOSIS — Z882 Allergy status to sulfonamides status: Secondary | ICD-10-CM

## 2016-12-06 DIAGNOSIS — W19XXXA Unspecified fall, initial encounter: Secondary | ICD-10-CM | POA: Diagnosis present

## 2016-12-06 DIAGNOSIS — S72002A Fracture of unspecified part of neck of left femur, initial encounter for closed fracture: Secondary | ICD-10-CM | POA: Diagnosis present

## 2016-12-06 DIAGNOSIS — S72002S Fracture of unspecified part of neck of left femur, sequela: Secondary | ICD-10-CM | POA: Diagnosis not present

## 2016-12-06 DIAGNOSIS — E1165 Type 2 diabetes mellitus with hyperglycemia: Secondary | ICD-10-CM | POA: Diagnosis present

## 2016-12-06 DIAGNOSIS — E038 Other specified hypothyroidism: Secondary | ICD-10-CM

## 2016-12-06 DIAGNOSIS — R51 Headache: Secondary | ICD-10-CM | POA: Diagnosis present

## 2016-12-06 DIAGNOSIS — S72009S Fracture of unspecified part of neck of unspecified femur, sequela: Secondary | ICD-10-CM | POA: Diagnosis not present

## 2016-12-06 DIAGNOSIS — S72009A Fracture of unspecified part of neck of unspecified femur, initial encounter for closed fracture: Secondary | ICD-10-CM

## 2016-12-06 LAB — CBC WITH DIFFERENTIAL/PLATELET
Basophils Absolute: 0.1 10*3/uL (ref 0.0–0.1)
Basophils Relative: 1 %
Eosinophils Absolute: 0.4 10*3/uL (ref 0.0–0.7)
Eosinophils Relative: 4 %
HCT: 40.8 % (ref 36.0–46.0)
Hemoglobin: 13.8 g/dL (ref 12.0–15.0)
Lymphocytes Relative: 18 %
Lymphs Abs: 1.7 10*3/uL (ref 0.7–4.0)
MCH: 29.9 pg (ref 26.0–34.0)
MCHC: 33.8 g/dL (ref 30.0–36.0)
MCV: 88.5 fL (ref 78.0–100.0)
Monocytes Absolute: 1 10*3/uL (ref 0.1–1.0)
Monocytes Relative: 11 %
Neutro Abs: 6 10*3/uL (ref 1.7–7.7)
Neutrophils Relative %: 66 %
Platelets: 127 10*3/uL — ABNORMAL LOW (ref 150–400)
RBC: 4.61 MIL/uL (ref 3.87–5.11)
RDW: 13.6 % (ref 11.5–15.5)
WBC: 9 10*3/uL (ref 4.0–10.5)

## 2016-12-06 LAB — URINALYSIS, ROUTINE W REFLEX MICROSCOPIC
Bilirubin Urine: NEGATIVE
Glucose, UA: NEGATIVE mg/dL
Hgb urine dipstick: NEGATIVE
Ketones, ur: NEGATIVE mg/dL
Leukocytes, UA: NEGATIVE
Nitrite: NEGATIVE
Protein, ur: NEGATIVE mg/dL
Specific Gravity, Urine: 1.02 (ref 1.005–1.030)
pH: 6 (ref 5.0–8.0)

## 2016-12-06 LAB — BASIC METABOLIC PANEL
Anion gap: 8 (ref 5–15)
BUN: 12 mg/dL (ref 6–20)
CO2: 25 mmol/L (ref 22–32)
Calcium: 9.5 mg/dL (ref 8.9–10.3)
Chloride: 106 mmol/L (ref 101–111)
Creatinine, Ser: 0.65 mg/dL (ref 0.44–1.00)
GFR calc Af Amer: >60 mL/min (ref >60)
GFR calc non Af Amer: >60 mL/min (ref >60)
Glucose, Bld: 128 mg/dL — ABNORMAL HIGH (ref 65–99)
Potassium: 3.4 mmol/L — ABNORMAL LOW (ref 3.5–5.1)
Sodium: 139 mmol/L (ref 135–145)

## 2016-12-06 LAB — SURGICAL PCR SCREEN
MRSA, PCR: NEGATIVE
STAPHYLOCOCCUS AUREUS: NEGATIVE

## 2016-12-06 LAB — CBG MONITORING, ED: Glucose-Capillary: 142 mg/dL — ABNORMAL HIGH (ref 65–99)

## 2016-12-06 LAB — GLUCOSE, CAPILLARY: GLUCOSE-CAPILLARY: 168 mg/dL — AB (ref 65–99)

## 2016-12-06 LAB — PREPARE RBC (CROSSMATCH)

## 2016-12-06 MED ORDER — CHLORHEXIDINE GLUCONATE 4 % EX LIQD
60.0000 mL | Freq: Once | CUTANEOUS | Status: AC
  Administered 2016-12-06: 4 via TOPICAL
  Filled 2016-12-06: qty 15

## 2016-12-06 MED ORDER — POLYVINYL ALCOHOL 1.4 % OP SOLN
1.0000 [drp] | Freq: Two times a day (BID) | OPHTHALMIC | Status: DC
  Administered 2016-12-06 – 2016-12-10 (×9): 1 [drp] via OPHTHALMIC
  Filled 2016-12-06 (×4): qty 15

## 2016-12-06 MED ORDER — MEMANTINE HCL-DONEPEZIL HCL ER 28-10 MG PO CP24: 1.0000 | ORAL_CAPSULE | Freq: Every day | ORAL | Status: DC

## 2016-12-06 MED ORDER — HEPARIN SODIUM (PORCINE) 5000 UNIT/ML IJ SOLN
5000.0000 [IU] | Freq: Three times a day (TID) | INTRAMUSCULAR | Status: DC
  Filled 2016-12-06: qty 1

## 2016-12-06 MED ORDER — METOPROLOL TARTRATE 25 MG PO TABS
25.0000 mg | ORAL_TABLET | Freq: Two times a day (BID) | ORAL | Status: DC
  Administered 2016-12-06 – 2016-12-10 (×9): 25 mg via ORAL
  Filled 2016-12-06 (×8): qty 1

## 2016-12-06 MED ORDER — HEPARIN SODIUM (PORCINE) 5000 UNIT/ML IJ SOLN
5000.0000 [IU] | Freq: Once | INTRAMUSCULAR | Status: AC
  Administered 2016-12-06: 5000 [IU] via SUBCUTANEOUS
  Filled 2016-12-06: qty 1

## 2016-12-06 MED ORDER — GABAPENTIN 300 MG PO CAPS
600.0000 mg | ORAL_CAPSULE | Freq: Three times a day (TID) | ORAL | Status: DC
  Administered 2016-12-06 – 2016-12-10 (×11): 600 mg via ORAL
  Filled 2016-12-06 (×11): qty 2

## 2016-12-06 MED ORDER — SODIUM CHLORIDE 0.9 % IV SOLN: Freq: Once | INTRAVENOUS | Status: DC

## 2016-12-06 MED ORDER — PANTOPRAZOLE SODIUM 40 MG PO TBEC
40.0000 mg | DELAYED_RELEASE_TABLET | Freq: Every day | ORAL | Status: DC
Start: 2016-12-06 — End: 2016-12-10
  Administered 2016-12-06 – 2016-12-10 (×5): 40 mg via ORAL
  Filled 2016-12-06 (×5): qty 1

## 2016-12-06 MED ORDER — PHENYTOIN SODIUM EXTENDED 100 MG PO CAPS
300.0000 mg | ORAL_CAPSULE | Freq: Every day | ORAL | Status: DC
  Administered 2016-12-06 – 2016-12-09 (×5): 300 mg via ORAL
  Filled 2016-12-06 (×4): qty 3

## 2016-12-06 MED ORDER — ONDANSETRON HCL 4 MG PO TABS: 4.0000 mg | ORAL_TABLET | Freq: Four times a day (QID) | ORAL | Status: DC | PRN

## 2016-12-06 MED ORDER — HYDROMORPHONE HCL 1 MG/ML IJ SOLN
0.5000 mg | INTRAMUSCULAR | Status: DC | PRN
  Administered 2016-12-06: 0.5 mg via INTRAVENOUS
  Filled 2016-12-06: qty 1

## 2016-12-06 MED ORDER — LORAZEPAM 2 MG/ML PO CONC: 0.5000 mg | Freq: Two times a day (BID) | ORAL | Status: DC

## 2016-12-06 MED ORDER — AMLODIPINE BESYLATE 5 MG PO TABS
10.0000 mg | ORAL_TABLET | Freq: Every day | ORAL | Status: DC
  Administered 2016-12-07 – 2016-12-10 (×4): 10 mg via ORAL
  Filled 2016-12-06 (×5): qty 2

## 2016-12-06 MED ORDER — POVIDONE-IODINE 10 % EX SWAB
2.0000 "application " | Freq: Once | CUTANEOUS | Status: AC
  Administered 2016-12-06: 2 via TOPICAL

## 2016-12-06 MED ORDER — CLINDAMYCIN PHOSPHATE 900 MG/50ML IV SOLN
900.0000 mg | INTRAVENOUS | Status: AC
  Administered 2016-12-07: 900 mg via INTRAVENOUS
  Filled 2016-12-06: qty 50

## 2016-12-06 MED ORDER — ACETAMINOPHEN 500 MG PO TABS
500.0000 mg | ORAL_TABLET | Freq: Three times a day (TID) | ORAL | Status: DC
  Administered 2016-12-06 – 2016-12-07 (×2): 500 mg via ORAL
  Filled 2016-12-06 (×3): qty 1

## 2016-12-06 MED ORDER — MEMANTINE HCL ER 28 MG PO CP24
28.0000 mg | ORAL_CAPSULE | Freq: Every day | ORAL | Status: DC
  Administered 2016-12-06 – 2016-12-10 (×5): 28 mg via ORAL
  Filled 2016-12-06 (×7): qty 1

## 2016-12-06 MED ORDER — DONEPEZIL HCL 5 MG PO TABS
10.0000 mg | ORAL_TABLET | Freq: Every day | ORAL | Status: DC
  Administered 2016-12-06 – 2016-12-10 (×5): 10 mg via ORAL
  Filled 2016-12-06 (×5): qty 2

## 2016-12-06 MED ORDER — DEXTROSE-NACL 5-0.9 % IV SOLN
INTRAVENOUS | Status: DC
  Administered 2016-12-06 – 2016-12-10 (×4): via INTRAVENOUS

## 2016-12-06 MED ORDER — INSULIN ASPART 100 UNIT/ML ~~LOC~~ SOLN
0.0000 [IU] | SUBCUTANEOUS | Status: DC
  Administered 2016-12-06: 1 [IU] via SUBCUTANEOUS
  Administered 2016-12-06: 2 [IU] via SUBCUTANEOUS
  Administered 2016-12-07 (×2): 1 [IU] via SUBCUTANEOUS
  Administered 2016-12-07 – 2016-12-08 (×2): 2 [IU] via SUBCUTANEOUS
  Administered 2016-12-08: 3 [IU] via SUBCUTANEOUS
  Administered 2016-12-08 – 2016-12-09 (×6): 2 [IU] via SUBCUTANEOUS
  Administered 2016-12-09: 1 [IU] via SUBCUTANEOUS
  Administered 2016-12-09: 2 [IU] via SUBCUTANEOUS
  Administered 2016-12-10: 1 [IU] via SUBCUTANEOUS
  Administered 2016-12-10 (×3): 2 [IU] via SUBCUTANEOUS
  Filled 2016-12-06: qty 1

## 2016-12-06 MED ORDER — ASPIRIN 81 MG PO CHEW
81.0000 mg | CHEWABLE_TABLET | Freq: Every day | ORAL | Status: DC
  Administered 2016-12-07: 81 mg via ORAL
  Filled 2016-12-06 (×2): qty 1

## 2016-12-06 MED ORDER — CITALOPRAM HYDROBROMIDE 20 MG PO TABS
20.0000 mg | ORAL_TABLET | Freq: Every day | ORAL | Status: DC
  Administered 2016-12-06 – 2016-12-10 (×5): 20 mg via ORAL
  Filled 2016-12-06 (×5): qty 1

## 2016-12-06 MED ORDER — LEVOTHYROXINE SODIUM 25 MCG PO TABS
25.0000 ug | ORAL_TABLET | Freq: Every day | ORAL | Status: DC
  Administered 2016-12-07 – 2016-12-10 (×4): 25 ug via ORAL
  Filled 2016-12-06 (×4): qty 1

## 2016-12-06 MED ORDER — LORAZEPAM 0.5 MG PO TABS
0.5000 mg | ORAL_TABLET | Freq: Two times a day (BID) | ORAL | Status: DC
  Administered 2016-12-06 – 2016-12-10 (×8): 0.5 mg via ORAL
  Filled 2016-12-06 (×7): qty 1

## 2016-12-06 MED ORDER — GABAPENTIN 100 MG PO CAPS
100.0000 mg | ORAL_CAPSULE | Freq: Every day | ORAL | Status: DC
  Administered 2016-12-06 – 2016-12-07 (×3): 100 mg via ORAL
  Filled 2016-12-06 (×3): qty 1

## 2016-12-06 MED ORDER — ONDANSETRON HCL 4 MG/2ML IJ SOLN
4.0000 mg | Freq: Four times a day (QID) | INTRAMUSCULAR | Status: DC | PRN
  Administered 2016-12-09: 4 mg via INTRAVENOUS
  Filled 2016-12-06: qty 2

## 2016-12-06 MED ORDER — SOOTHE XP OP SOLN: Freq: Two times a day (BID) | OPHTHALMIC | Status: DC

## 2016-12-06 MED ORDER — LISINOPRIL 10 MG PO TABS
40.0000 mg | ORAL_TABLET | Freq: Every day | ORAL | Status: DC
  Administered 2016-12-06: 10 mg via ORAL
  Administered 2016-12-07 – 2016-12-10 (×4): 40 mg via ORAL
  Filled 2016-12-06 (×5): qty 4

## 2016-12-06 MED ORDER — POLYETHYLENE GLYCOL 3350 17 G PO PACK
17.0000 g | PACK | Freq: Every day | ORAL | Status: DC
  Administered 2016-12-06 – 2016-12-10 (×4): 17 g via ORAL
  Filled 2016-12-06 (×4): qty 1

## 2016-12-06 NOTE — ED Provider Notes (Signed)
[facility] Provider Note   CSN: [id number] Arrival date & time: 12/06/16  1048     History   Chief Complaint Chief Complaint  Patient presents with  . Back Pain    HPI [name] is a 81 y.o. female.  HPI   81[facility]. Pt has dementia and doesn't provide much useful history herself. She does endorse back pain when specifically asked. She cannot tell me how long she has had it or about any specific injuries. History is primarily from nursing home report and then subsequent supplemental information for the patient's daughter.  Past Medical History:  Diagnosis Date  . Alzheimer disease    per facility paperwork dated 05/04/2016  . Anxiety   . Dementia   . Diabetes mellitus   . Hypercholesteremia   . Hypertension   . Intracranial bleeding (HCC)    10 years ago  . Seizures (HCC)    approx 10 years when she had brain bleed  . Thyroid disease     Patient Active Problem List   Diagnosis Date Noted  . Right hip pain   . Palliative care encounter   . Encounter for hospice care discussion   . Partial symptomatic epilepsy with complex partial seizures, not intractable, without status epilepticus (HCC)   . Altered mental status 07/29/2016  . Hypokalemia 07/29/2016  . Displaced fracture of right femoral neck (HCC) 05/07/2016  . Hip fracture (HCC) 05/04/2016  . Closed right hip fracture (HCC) 05/04/2016  . Type 2 diabetes mellitus with hyperglycemia (HCC) 05/04/2016  . Dementia 05/04/2016  . Closed fracture of part of upper end of humerus 01/03/2015  . Hypothyroidism 12/19/2010  . DIABETES MELLITUS, TYPE II 12/19/2010  . DEMENTIA 12/19/2010  . ANXIETY 12/19/2010  . Essential hypertension 12/19/2010  . DIZZINESS 12/19/2010  . FATIGUE 12/19/2010  . HEADACHE 12/19/2010    Past Surgical History:  Procedure Laterality Date  . ABDOMINAL HYSTERECTOMY    . ANTERIOR APPROACH HEMI HIP ARTHROPLASTY Right 05/07/2016   Procedure: ANTERIOR APPROACH HEMI HIP ARTHROPLASTY VERSUS TOTAL HIP ARTHROPLASTY;  Surgeon: Samson FredericBrian Swinteck, MD;  Location: [facility] OR;  Service: Orthopedics;  Laterality: Right;  . CHOLECYSTECTOMY      OB History    No data available       Home Medications    Prior to Admission medications   Medication Sig Start Date End Date Taking? Authorizing Provider  acetaminophen (TYLENOL) 500 MG tablet Take 1 tablet (500 mg total) by mouth every 8 (eight) hours as needed for headache. Patient taking differently: Take 500 mg by mouth every 8 (eight) hours.  08/02/16   Mauricio Annett Gulaaniel Arrien, MD  amLODipine (NORVASC) 5 MG tablet Take 2 tablets (10 mg total) by mouth daily. 05/09/16   Shanker Levora DredgeM Ghimire, MD  Artificial Tear Solution (SOOTHE XP OP) Apply 1 drop to eye 2 (two) times daily.    Historical Provider, MD  aspirin 81 MG chewable tablet Chew 1 tablet (81 mg total) by mouth daily. 08/02/16   Mauricio Annett Gulaaniel Arrien, MD  cephALEXin (KEFLEX) 500 MG capsule Take 1 capsule (500 mg total) by mouth 2 (two) times daily. Patient not taking: Reported on 09/22/2016 08/09/16   Glynn OctaveStephen Rancour, MD  citalopram (CELEXA) 20 MG tablet Take 20 mg by mouth daily.    Historical Provider, MD  feeding supplement, ENSURE ENLIVE, (ENSURE ENLIVE) LIQD Take 237 mLs by mouth 2 (two) times daily between meals. Patient not taking: Reported on 09/22/2016 05/09/16  Shanker Levora Dredge, MD  gabapentin (NEURONTIN) 100 MG capsule Take 1 capsule (100 mg total) by mouth at bedtime. Patient not taking: Reported on 09/22/2016 08/02/16   Coralie Keens, MD  gabapentin (NEURONTIN) 300 MG capsule Take 600 mg by mouth 2 (two) times daily. 8 AM , 2 PM AND 8 PM.    Historical Provider, MD  levothyroxine (SYNTHROID, LEVOTHROID) 25 MCG tablet Take 25 mcg by mouth daily before breakfast.    Historical Provider, MD  lisinopril (PRINIVIL,ZESTRIL) 40 MG tablet Take 40 mg by mouth daily.    Historical Provider, MD  LORazepam (ATIVAN) 2 MG/ML concentrated  solution Take 0.5 mg by mouth 2 (two) times daily.    Historical Provider, MD  Memantine HCl-Donepezil HCl (NAMZARIC) 28-10 MG CP24 Take 1 tablet by mouth daily.    Historical Provider, MD  metFORMIN (GLUCOPHAGE) 500 MG tablet Take 250 mg by mouth 2 (two) times daily with a meal.    Historical Provider, MD  metoprolol tartrate (LOPRESSOR) 25 MG tablet Take 25 mg by mouth 2 (two) times daily.    Historical Provider, MD  pantoprazole (PROTONIX) 40 MG tablet Take 1 tablet (40 mg total) by mouth daily. Patient not taking: Reported on 09/22/2016 05/09/16   Maretta Bees, MD  phenytoin (DILANTIN) 300 MG ER capsule Take 1 capsule (300 mg total) by mouth at bedtime. 08/02/16   Mauricio Annett Gula, MD  polyethylene glycol (MIRALAX / Ethelene Hal) packet Take 17 g by mouth daily. 05/09/16   Shanker Levora Dredge, MD    Family History Family History  Problem Relation Age of Onset  . Hypertension Other     Social History Social History  Substance Use Topics  . Smoking status: Former Games developer  . Smokeless tobacco: Former Neurosurgeon  . Alcohol use No     Allergies   Penicillins; Promethazine hcl; and Sulfonamide derivatives   Review of Systems Review of Systems  Level V caveat because of dementia. Physical Exam Updated Vital Signs BP 146/64   Pulse 69   Temp 98.9 F (37.2 C) (Oral)   Resp 12   SpO2 97%   Physical Exam  Constitutional: She appears well-developed and well-nourished. No distress.  HENT:  Head: Normocephalic and atraumatic.  Eyes: Conjunctivae are normal. Right eye exhibits no discharge. Left eye exhibits no discharge.  Neck: Neck supple.  Cardiovascular: Normal rate, regular rhythm and normal heart sounds.  Exam reveals no gallop and no friction rub.   No murmur heard. Pulmonary/Chest: Effort normal and breath sounds normal. No respiratory distress.  Abdominal: Soft. She exhibits no distension. There is no tenderness.  Musculoskeletal: She exhibits no edema or tenderness.  She  appears reasonably comfortable while laying still in bed. She reacts strongly when trying to sit up. She is tender across her lumbar spine and sacrum. She reacts pain with range of motion of either hip, but worse on the left. She will not actively range it herself. No shortening or malrotation. Sensation seems intact. She has a palpable DP pulse bilaterally. She has no appreciable midline cervical tenderness.  Neurological: She is alert.  Skin: Skin is warm and dry.  Psychiatric: She has a normal mood and affect. Her behavior is normal. Thought content normal.  Nursing note and vitals reviewed.    ED Treatments / Results  Labs (all labs ordered are listed, but only abnormal results are displayed) Labs Reviewed  CBC WITH DIFFERENTIAL/PLATELET - Abnormal; Notable for the following:       Result Value  Platelets 127 (*)    All other components within normal limits  BASIC METABOLIC PANEL - Abnormal; Notable for the following:    Potassium 3.4 (*)    Glucose, Bld 128 (*)    All other components within normal limits  URINALYSIS, ROUTINE W REFLEX MICROSCOPIC    EKG  EKG Interpretation  Date/Time:  Thursday December 06 2016 16:25:50 EST Ventricular Rate:  76 PR Interval:    QRS Duration: 120 QT Interval:  413 QTC Calculation: 465 R Axis:   26 Text Interpretation:  Normal sinus rhythm Non-specific intra-ventricular conduction delay changes are noted from last ekg of 08/08/16 Confirmed by RAY MD, Duwayne HeckANIELLE [phone]([id number]) on 12/08/2016 11:16:00 AM       Radiology Dg Lumbar Spine Complete  Result Date: 12/06/2016 CLINICAL DATA:  Generalized low back pain following fall on concrete yesterday EXAM: LUMBAR SPINE - COMPLETE 4+ VIEW COMPARISON:  09/22/2016 FINDINGS: Postsurgical changes are again noted at L2-3. Compression deformity at L2 is again noted and stable. No new compression deformity is seen. No hardware failure is noted. Diffuse aortic calcifications are noted. Mild osteophytic changes are  seen. IMPRESSION: Stable appearing L2 compression deformity when compared with the prior exam. No new focal abnormality is seen. Electronically Signed   By: Alcide CleverMark  Lukens M.D.   On: 12/06/2016 13:39   Dg Hip Unilat W Or Wo Pelvis 2-3 Views Left  Result Date: 12/06/2016 CLINICAL DATA:  Left hip pain after fall EXAM: DG HIP (WITH OR WITHOUT PELVIS) 2-3V LEFT COMPARISON:  Pelvic radiograph from 09/22/2016 FINDINGS: There is an acute, closed, valgus angulated subcapital left femoral neck fracture with slight impaction superolaterally. The bony pelvis appears intact. Right bipolar hip arthroplasty is partially imaged and unremarkable in appearance. There is lower lumbar degenerative disc and facet arthropathy. IMPRESSION: Acute, closed, valgus angulated subcapital left femoral neck fracture with slight impaction. Electronically Signed   By: Tollie Ethavid  Kwon M.D.   On: 12/06/2016 15:23    Procedures Procedures (including critical care time)  Medications Ordered in ED Medications - No data to display   Initial Impression / Assessment and Plan / ED Course  I have reviewed the triage vital signs and the nursing notes.  Pertinent labs & imaging results that were available during my care of the patient were reviewed by me and considered in my medical decision making (see chart for details).  Clinical Course    81 year old female sent for evaluation of back and hip pain. Imaging significant for a left hip fracture. Closed injury. Neurovascularly intact.  Final Clinical Impressions(s) / ED Diagnoses   Final diagnoses:  Closed fracture of left hip, initial encounter [facility](HCC)    New Prescriptions New Prescriptions   No medications on file     Raeford RazorStephen Lynia Landry, MD 12/09/16 0830

## 2016-12-06 NOTE — ED Notes (Addendum)
Pt's daughter called asking about patient's condition. Daughter states that patient was sent here due to left hip and thigh pain. This nurse informed daughter that report from nursing home did not include that information. Daughter informed that patient's leg will be examined for fractures.  Dr. Juleen ChinaKohut informed.

## 2016-12-06 NOTE — H&P (Signed)
History and Physical    Ruthanne Mcneish Foulk JYN:[id number] DOB: 1935-09-10 DOA: 12/06/2016  PCP: Galvin Proffer, MD  Patient coming from: SNF   Chief Complaint:   Fall with left hip Fx.   HPI: [name] is an 81 y.o. female with hx of dementia, SNF resident with DNR code status, hx of HTN, anxiety, HLD, DM, seizure on dilantin,  hypothyrodism, hx of ICH, fell and has left hip pain, presented to the ER.  X ray of the hip showed valgus left subcapital Fx with mild impaction.  Labs were unremarkable, with K of 3.4, and UA was negative. No EKG was obtained.  EDP spoke with Dr Romeo Apple, who will see her in consultation.  Hospitalist was asked to admit her for further Tx.   ED Course:  See above.  Rewiew of Systems: Unable.    Past Medical History:  Diagnosis Date  . Alzheimer disease    per facility paperwork dated 05/04/2016  . Anxiety   . Dementia   . Diabetes mellitus   . Hypercholesteremia   . Hypertension   . Intracranial bleeding (HCC)    10 years ago  . Seizures (HCC)    approx 10 years when she had brain bleed  . Thyroid disease     Past Surgical History:  Procedure Laterality Date  . ABDOMINAL HYSTERECTOMY    . ANTERIOR APPROACH HEMI HIP ARTHROPLASTY Right 05/07/2016   Procedure: ANTERIOR APPROACH HEMI HIP ARTHROPLASTY VERSUS TOTAL HIP ARTHROPLASTY;  Surgeon: Samson Frederic, MD;  Location: [facility] OR;  Service: Orthopedics;  Laterality: Right;  . CHOLECYSTECTOMY       reports that she has quit smoking. She has quit using smokeless tobacco. She reports that she does not drink alcohol or use drugs.  Allergies  Allergen Reactions  . Penicillins Nausea And Vomiting    Has patient had a PCN reaction causing immediate rash, facial/tongue/throat swelling, SOB or lightheadedness with hypotension: {unknown Has patient had a PCN reaction causing severe rash involving mucus membranes or skin necrosis: unknown Has patient had a PCN reaction that required hospitalization:  unknown Has patient had a PCN reaction occurring within the last 10 years: unknown If all of the above answers are "NO", then may proceed with Cephalosporin use.   . Promethazine Hcl Nausea And Vomiting  . Sulfonamide Derivatives Other (See Comments)    unknown    Family History  Problem Relation Age of Onset  . Hypertension Other      Prior to Admission medications   Medication Sig Start Date End Date Taking? Authorizing Provider  acetaminophen (TYLENOL) 500 MG tablet Take 1 tablet (500 mg total) by mouth every 8 (eight) hours as needed for headache. Patient taking differently: Take 500 mg by mouth every 8 (eight) hours.  08/02/16   Mauricio Annett Gula, MD  amLODipine (NORVASC) 5 MG tablet Take 2 tablets (10 mg total) by mouth daily. 05/09/16   Shanker Levora Dredge, MD  Artificial Tear Solution (SOOTHE XP OP) Apply 1 drop to eye 2 (two) times daily.    Historical Provider, MD  aspirin 81 MG chewable tablet Chew 1 tablet (81 mg total) by mouth daily. 08/02/16   Mauricio Annett Gula, MD  cephALEXin (KEFLEX) 500 MG capsule Take 1 capsule (500 mg total) by mouth 2 (two) times daily. Patient not taking: Reported on 09/22/2016 08/09/16   Glynn Octave, MD  citalopram (CELEXA) 20 MG tablet Take 20 mg by mouth daily.    Historical Provider, MD  feeding supplement,  ENSURE ENLIVE, (ENSURE ENLIVE) LIQD Take 237 mLs by mouth 2 (two) times daily between meals. Patient not taking: Reported on 09/22/2016 05/09/16   Maretta BeesShanker M Ghimire, MD  gabapentin (NEURONTIN) 100 MG capsule Take 1 capsule (100 mg total) by mouth at bedtime. Patient not taking: Reported on 09/22/2016 08/02/16   Coralie KeensMauricio Daniel Arrien, MD  gabapentin (NEURONTIN) 300 MG capsule Take 600 mg by mouth 2 (two) times daily. 8 AM , 2 PM AND 8 PM.    Historical Provider, MD  levothyroxine (SYNTHROID, LEVOTHROID) 25 MCG tablet Take 25 mcg by mouth daily before breakfast.    Historical Provider, MD  lisinopril (PRINIVIL,ZESTRIL) 40 MG tablet Take 40  mg by mouth daily.    Historical Provider, MD  LORazepam (ATIVAN) 2 MG/ML concentrated solution Take 0.5 mg by mouth 2 (two) times daily.    Historical Provider, MD  Memantine HCl-Donepezil HCl (NAMZARIC) 28-10 MG CP24 Take 1 tablet by mouth daily.    Historical Provider, MD  metFORMIN (GLUCOPHAGE) 500 MG tablet Take 250 mg by mouth 2 (two) times daily with a meal.    Historical Provider, MD  metoprolol tartrate (LOPRESSOR) 25 MG tablet Take 25 mg by mouth 2 (two) times daily.    Historical Provider, MD  pantoprazole (PROTONIX) 40 MG tablet Take 1 tablet (40 mg total) by mouth daily. Patient not taking: Reported on 09/22/2016 05/09/16   Maretta BeesShanker M Ghimire, MD  phenytoin (DILANTIN) 300 MG ER capsule Take 1 capsule (300 mg total) by mouth at bedtime. 08/02/16   Mauricio Annett Gulaaniel Arrien, MD  polyethylene glycol (MIRALAX / Ethelene HalGLYCOLAX) packet Take 17 g by mouth daily. 05/09/16   Maretta BeesShanker M Ghimire, MD    Physical Exam: Vitals:   12/06/16 1200 12/06/16 1230 12/06/16 1332 12/06/16 1340  BP: 144/68 142/64 146/64   Pulse: 66 69  69  Resp:   16 12  Temp:      TempSrc:      SpO2: (!) 87% (!) 87%  97%      Constitutional: NAD, calm, comfortable Vitals:   12/06/16 1200 12/06/16 1230 12/06/16 1332 12/06/16 1340  BP: 144/68 142/64 146/64   Pulse: 66 69  69  Resp:   16 12  Temp:      TempSrc:      SpO2: (!) 87% (!) 87%  97%   Eyes: PERRL, lids and conjunctivae normal ENMT: Mucous membranes are moist. Posterior pharynx clear of any exudate or lesions.Normal dentition.  Neck: normal, supple, no masses, no thyromegaly Respiratory: clear to auscultation bilaterally, no wheezing, no crackles. Normal respiratory effort. No accessory muscle use.  Cardiovascular: Regular rate and rhythm, no murmurs / rubs / gallops. No extremity edema. 2+ pedal pulses. No carotid bruits.  Abdomen: no tenderness, no masses palpated. No hepatosplenomegaly. Bowel sounds positive.  Musculoskeletal: no clubbing / cyanosis. No joint  deformity upper and lower extremities. Did not examine left hip joint.  no contractures. Normal muscle tone.  Skin: no rashes, lesions, ulcers. No induration Neurologic: CN 2-12 grossly intact. Sensation intact, DTR normal. Strength 5/5 in all 4.  Psychiatric: anxious   Labs on Admission: I have personally reviewed following labs and imaging studies  CBC:  Recent Labs Lab 12/06/16 1329  WBC 9.0  NEUTROABS 6.0  HGB 13.8  HCT 40.8  MCV 88.5  PLT 127*   Basic Metabolic Panel:  Recent Labs Lab 12/06/16 1329  NA 139  K 3.4*  CL 106  CO2 25  GLUCOSE 128*  BUN 12  CREATININE  0.65  CALCIUM 9.5   Urine analysis:    Component Value Date/Time   COLORURINE YELLOW 08/08/2016 1800   APPEARANCEUR HAZY (A) 08/08/2016 1800   LABSPEC 1.015 08/08/2016 1800   PHURINE 7.0 08/08/2016 1800   GLUCOSEU NEGATIVE 08/08/2016 1800   HGBUR TRACE (A) 08/08/2016 1800   HGBUR negative 12/19/2010 1223   BILIRUBINUR NEGATIVE 08/08/2016 1800   KETONESUR 40 (A) 08/08/2016 1800   PROTEINUR 30 (A) 08/08/2016 1800   UROBILINOGEN 0.2 06/14/2015 2048   NITRITE NEGATIVE 08/08/2016 1800   LEUKOCYTESUR NEGATIVE 08/08/2016 1800   Assessment/Plan Principal Problem:   Hip fracture (HCC) Active Problems:   Hypothyroidism   Essential hypertension   Type 2 diabetes mellitus with hyperglycemia (HCC)   Dementia   Partial symptomatic epilepsy with complex partial seizures, not intractable, without status epilepticus (HCC)   Closed left hip fracture (HCC)    PLAN:   Left hip Fx:  NPO after midnight.  Dr Romeo AppleHarrison consulted.  Will likely need to proceed with ORIF.  Start heparin SQ for DVT prophylaxis. Will obtain EKG and place foley catheter.  Dilaudid PRN.   DM:  Will follow CBGs.  SSI as required.  Hypothyroidism:  Continue oral supplement.  Check TSH.   Seizure Hx:  Stable, continue with Dilantin.    DVT prophylaxis: SQ Heparin. Code Status: DNR.  Family Communication: None. Disposition Plan:  To SNF after ORIF.  Consults called: Dr Romeo AppleHarrison.  Admission status: Inpatient.   Vinia Jemmott MD FACP. [facility]  If 7PM-7AM, please contact night-coverage [email or web address] Password [email or web address]  12/06/2016, 4:07 PM

## 2016-12-06 NOTE — ED Triage Notes (Signed)
[facility] was [facility] assessment sent with pt. Per staff hospice nurse was there this morning and she was the one that felt the patient should come here for evaluation.

## 2016-12-06 NOTE — ED Notes (Signed)
cbg of 142. 

## 2016-12-06 NOTE — ED Notes (Signed)
Daughter's phone at home is [phone]

## 2016-12-06 NOTE — ED Notes (Signed)
Patient refuses rectal temp. In and out offered for urine collection. Patient refused in and out. States she will urinate when she has to.

## 2016-12-07 ENCOUNTER — Inpatient Hospital Stay (HOSPITAL_COMMUNITY): Payer: Medicare Other | Admitting: Anesthesiology

## 2016-12-07 ENCOUNTER — Encounter (HOSPITAL_COMMUNITY)
Admission: EM | Disposition: A | Discharge status: Skilled Nursing Facility | Payer: Self-pay | Source: Home / Self Care | Attending: Family Medicine

## 2016-12-07 ENCOUNTER — Encounter (HOSPITAL_COMMUNITY): Payer: Self-pay | Admitting: Anesthesiology

## 2016-12-07 ENCOUNTER — Inpatient Hospital Stay (HOSPITAL_COMMUNITY): Payer: Medicare Other

## 2016-12-07 DIAGNOSIS — S72002A Fracture of unspecified part of neck of left femur, initial encounter for closed fracture: Secondary | ICD-10-CM

## 2016-12-07 HISTORY — PX: HIP PINNING,CANNULATED: SHX1758

## 2016-12-07 LAB — BASIC METABOLIC PANEL
Anion gap: 6 (ref 5–15)
BUN: 15 mg/dL (ref 6–20)
CALCIUM: 9.1 mg/dL (ref 8.9–10.3)
CHLORIDE: 107 mmol/L (ref 101–111)
CO2: 26 mmol/L (ref 22–32)
CREATININE: 0.78 mg/dL (ref 0.44–1.00)
Glucose, Bld: 131 mg/dL — ABNORMAL HIGH (ref 65–99)
Potassium: 3.5 mmol/L (ref 3.5–5.1)
SODIUM: 139 mmol/L (ref 135–145)

## 2016-12-07 LAB — GLUCOSE, CAPILLARY
GLUCOSE-CAPILLARY: 127 mg/dL — AB (ref 65–99)
GLUCOSE-CAPILLARY: 137 mg/dL — AB (ref 65–99)
GLUCOSE-CAPILLARY: 142 mg/dL — AB (ref 65–99)
GLUCOSE-CAPILLARY: 167 mg/dL — AB (ref 65–99)
GLUCOSE-CAPILLARY: 205 mg/dL — AB (ref 65–99)
Glucose-Capillary: 108 mg/dL — ABNORMAL HIGH (ref 65–99)
Glucose-Capillary: 104 mg/dL — ABNORMAL HIGH (ref 65–99)

## 2016-12-07 LAB — CBC
HCT: 36.8 % (ref 36.0–46.0)
Hemoglobin: 12 g/dL (ref 12.0–15.0)
MCH: 29.1 pg (ref 26.0–34.0)
MCHC: 32.6 g/dL (ref 30.0–36.0)
MCV: 89.1 fL (ref 78.0–100.0)
PLATELETS: 112 10*3/uL — AB (ref 150–400)
RBC: 4.13 MIL/uL (ref 3.87–5.11)
RDW: 13.2 % (ref 11.5–15.5)
WBC: 6.6 10*3/uL (ref 4.0–10.5)

## 2016-12-07 SURGERY — FIXATION, FEMUR, NECK, PERCUTANEOUS, USING SCREW
Anesthesia: Spinal | Site: Hip | Laterality: Left

## 2016-12-07 MED ORDER — METOCLOPRAMIDE HCL 10 MG PO TABS: 5.0000 mg | ORAL_TABLET | Freq: Three times a day (TID) | ORAL | Status: DC | PRN

## 2016-12-07 MED ORDER — MIDAZOLAM HCL 5 MG/5ML IJ SOLN
INTRAMUSCULAR | Status: DC | PRN
  Administered 2016-12-07: 0.5 mg via INTRAVENOUS

## 2016-12-07 MED ORDER — BUPIVACAINE IN DEXTROSE 0.75-8.25 % IT SOLN
INTRATHECAL | Status: AC
  Filled 2016-12-07: qty 2

## 2016-12-07 MED ORDER — FENTANYL CITRATE (PF) 100 MCG/2ML IJ SOLN
25.0000 ug | Freq: Once | INTRAMUSCULAR | Status: AC
  Administered 2016-12-07: 25 ug via INTRAVENOUS

## 2016-12-07 MED ORDER — 0.9 % SODIUM CHLORIDE (POUR BTL) OPTIME
TOPICAL | Status: DC | PRN
  Administered 2016-12-07: 1000 mL

## 2016-12-07 MED ORDER — CLINDAMYCIN PHOSPHATE 900 MG/50ML IV SOLN
INTRAVENOUS | Status: AC
Start: 2016-12-07 — End: 2016-12-07
  Filled 2016-12-07: qty 50

## 2016-12-07 MED ORDER — METOCLOPRAMIDE HCL 5 MG/ML IJ SOLN
5.0000 mg | Freq: Three times a day (TID) | INTRAMUSCULAR | Status: DC | PRN
Start: 2016-12-07 — End: 2016-12-10

## 2016-12-07 MED ORDER — ACETAMINOPHEN 325 MG PO TABS: 650.0000 mg | ORAL_TABLET | Freq: Four times a day (QID) | ORAL | Status: DC | PRN

## 2016-12-07 MED ORDER — BUPIVACAINE-EPINEPHRINE (PF) 0.25% -1:200000 IJ SOLN
INTRAMUSCULAR | Status: DC | PRN
  Administered 2016-12-07: 60 mL

## 2016-12-07 MED ORDER — FENTANYL CITRATE (PF) 100 MCG/2ML IJ SOLN: 25.0000 ug | INTRAMUSCULAR | Status: DC | PRN

## 2016-12-07 MED ORDER — ACETAMINOPHEN 650 MG RE SUPP: 650.0000 mg | Freq: Four times a day (QID) | RECTAL | Status: DC | PRN

## 2016-12-07 MED ORDER — FENTANYL CITRATE (PF) 100 MCG/2ML IJ SOLN
INTRAMUSCULAR | Status: DC | PRN
Start: 2016-12-07 — End: 2016-12-07
  Administered 2016-12-07: 25 ug via INTRATHECAL
  Administered 2016-12-07: 25 ug via INTRAVENOUS

## 2016-12-07 MED ORDER — CLINDAMYCIN PHOSPHATE 600 MG/50ML IV SOLN
600.0000 mg | Freq: Four times a day (QID) | INTRAVENOUS | Status: AC
  Administered 2016-12-07 – 2016-12-08 (×2): 600 mg via INTRAVENOUS
  Filled 2016-12-07 (×2): qty 50

## 2016-12-07 MED ORDER — BUPIVACAINE IN DEXTROSE 0.75-8.25 % IT SOLN
INTRATHECAL | Status: DC | PRN
  Administered 2016-12-07: 13 mg via INTRATHECAL

## 2016-12-07 MED ORDER — MIDAZOLAM HCL 2 MG/2ML IJ SOLN
1.0000 mg | INTRAMUSCULAR | Status: DC | PRN
  Administered 2016-12-07 (×2): 1 mg via INTRAVENOUS

## 2016-12-07 MED ORDER — ASPIRIN EC 325 MG PO TBEC
325.0000 mg | DELAYED_RELEASE_TABLET | Freq: Every day | ORAL | Status: DC
  Administered 2016-12-08 – 2016-12-10 (×3): 325 mg via ORAL
  Filled 2016-12-07 (×3): qty 1

## 2016-12-07 MED ORDER — PHENOL 1.4 % MT LIQD: 1.0000 | OROMUCOSAL | Status: DC | PRN

## 2016-12-07 MED ORDER — PROPOFOL 500 MG/50ML IV EMUL
INTRAVENOUS | Status: DC | PRN
  Administered 2016-12-07: 25 ug/kg/min via INTRAVENOUS

## 2016-12-07 MED ORDER — MORPHINE SULFATE (PF) 2 MG/ML IV SOLN
0.5000 mg | INTRAVENOUS | Status: DC | PRN
  Administered 2016-12-08 – 2016-12-10 (×11): 0.5 mg via INTRAVENOUS
  Filled 2016-12-07 (×11): qty 1

## 2016-12-07 MED ORDER — MIDAZOLAM HCL 2 MG/2ML IJ SOLN
INTRAMUSCULAR | Status: AC
  Filled 2016-12-07: qty 2

## 2016-12-07 MED ORDER — FENTANYL CITRATE (PF) 100 MCG/2ML IJ SOLN
INTRAMUSCULAR | Status: AC
  Filled 2016-12-07: qty 2

## 2016-12-07 MED ORDER — EPHEDRINE SULFATE 50 MG/ML IJ SOLN
INTRAMUSCULAR | Status: DC | PRN
  Administered 2016-12-07 (×2): 10 mg via INTRAVENOUS

## 2016-12-07 MED ORDER — LACTATED RINGERS IV SOLN
INTRAVENOUS | Status: DC
Start: 2016-12-07 — End: 2016-12-07
  Administered 2016-12-07: 10:00:00 via INTRAVENOUS

## 2016-12-07 MED ORDER — MENTHOL 3 MG MT LOZG: 1.0000 | LOZENGE | OROMUCOSAL | Status: DC | PRN

## 2016-12-07 SURGICAL SUPPLY — 52 items
BAG HAMPER (MISCELLANEOUS) ×3 IMPLANT
BIT DRILL 5 ACE CANN QC (BIT) ×3 IMPLANT
BLADE HEX COATED 2.75 (ELECTRODE) ×3 IMPLANT
BNDG GAUZE ELAST 4 BULKY (GAUZE/BANDAGES/DRESSINGS) ×3 IMPLANT
CHLORAPREP W/TINT 26ML (MISCELLANEOUS) ×3 IMPLANT
CLOTH BEACON ORANGE TIMEOUT ST (SAFETY) ×3 IMPLANT
COVER LIGHT HANDLE STERIS (MISCELLANEOUS) ×8 IMPLANT
DECANTER SPIKE VIAL GLASS SM (MISCELLANEOUS) ×6 IMPLANT
DRAPE STERI IOBAN 125X83 (DRAPES) ×3 IMPLANT
DRESSING MEPILEX BORDER 6X8 (GAUZE/BANDAGES/DRESSINGS) ×1 IMPLANT
DRSG MEPILEX BORDER 4X12 (GAUZE/BANDAGES/DRESSINGS) ×2 IMPLANT
DRSG MEPILEX BORDER 6X8 (GAUZE/BANDAGES/DRESSINGS) ×3
GLOVE BIOGEL PI IND STRL 7.0 (GLOVE) ×1 IMPLANT
GLOVE BIOGEL PI INDICATOR 7.0 (GLOVE) ×4
GLOVE ECLIPSE 6.5 STRL STRAW (GLOVE) ×2 IMPLANT
GLOVE EXAM NITRILE MD LF STRL (GLOVE) ×2 IMPLANT
GLOVE SKINSENSE NS SZ8.0 LF (GLOVE) ×2
GLOVE SKINSENSE STRL SZ8.0 LF (GLOVE) ×1 IMPLANT
GLOVE SS N UNI LF 8.5 STRL (GLOVE) ×3 IMPLANT
GOWN STRL REUS W/TWL LRG LVL3 (GOWN DISPOSABLE) ×6 IMPLANT
GOWN STRL REUS W/TWL XL LVL3 (GOWN DISPOSABLE) ×3 IMPLANT
INST SET MAJOR BONE (KITS) ×3 IMPLANT
KIT BLADEGUARD II DBL (SET/KITS/TRAYS/PACK) ×3 IMPLANT
KIT ROOM TURNOVER APOR (KITS) ×3 IMPLANT
MANIFOLD NEPTUNE II (INSTRUMENTS) ×3 IMPLANT
MARKER SKIN DUAL TIP RULER LAB (MISCELLANEOUS) ×3 IMPLANT
NDL HYPO 21X1.5 SAFETY (NEEDLE) ×1 IMPLANT
NDL SPNL 18GX3.5 QUINCKE PK (NEEDLE) ×1 IMPLANT
NEEDLE HYPO 21X1.5 SAFETY (NEEDLE) ×3 IMPLANT
NEEDLE SPNL 18GX3.5 QUINCKE PK (NEEDLE) ×3 IMPLANT
NS IRRIG 1000ML POUR BTL (IV SOLUTION) ×3 IMPLANT
PACK BASIC III (CUSTOM PROCEDURE TRAY) ×3
PACK SRG BSC III STRL LF ECLPS (CUSTOM PROCEDURE TRAY) ×1 IMPLANT
PAD ABD 5X9 TENDERSORB (GAUZE/BANDAGES/DRESSINGS) ×3 IMPLANT
PENCIL HANDSWITCHING (ELECTRODE) ×3 IMPLANT
PIN THREADED GUIDE ACE (PIN) ×9 IMPLANT
SCREW CANN 32MM 6.5X75MM (Screw) ×2 IMPLANT
SCREW CANN 6.5 70MM (Screw) ×3 IMPLANT
SCREW CANN 6.5 75MM (Screw) ×6 IMPLANT
SCREW CANN LG 6.5 FLT 70X22 (Screw) IMPLANT
SCREW CANN LG 6.5 FLT 75X22 (Screw) IMPLANT
SET BASIN LINEN APH (SET/KITS/TRAYS/PACK) ×3 IMPLANT
SPONGE LAP 18X18 X RAY DECT (DISPOSABLE) ×3 IMPLANT
STAPLER VISISTAT 35W (STAPLE) ×3 IMPLANT
SUT BRALON NAB BRD #1 30IN (SUTURE) ×2 IMPLANT
SUT MNCRL 0 VIOLET CTX 36 (SUTURE) ×1 IMPLANT
SUT MON AB 2-0 CT1 36 (SUTURE) ×1 IMPLANT
SUT MONOCRYL 0 CTX 36 (SUTURE) ×2
SYR 30ML LL (SYRINGE) ×3 IMPLANT
SYR BULB IRRIGATION 50ML (SYRINGE) ×6 IMPLANT
WASHER ACECAN 6.5 (Washer) ×6 IMPLANT
YANKAUER SUCT BULB TIP 10FT TU (MISCELLANEOUS) ×3 IMPLANT

## 2016-12-07 NOTE — Clinical Social Work Placement (Signed)
   CLINICAL SOCIAL WORK PLACEMENT  NOTE  Date:  12/07/2016  Patient Details  Name: [name] MRN: [id number] Date of Birth: 06/04/1935  Clinical Social Work is seeking post-discharge placement for this patient at the Skilled  Nursing Facility level of care (*CSW will initial, date and re-position this form in  chart as items are completed):  Yes   Patient/family provided with [place] Clinical Social Work Department's list of facilities offering this level of care within the geographic area requested by the patient (or if unable, by the patient's family).  Yes   Patient/family informed of their freedom to choose among providers that offer the needed level of care, that participate in Medicare, Medicaid or managed care program needed by the patient, have an available bed and are willing to accept the patient.  Yes   Patient/family informed of [facility]'s ownership interest in [facility] and [facility], as well as of the fact that they are under no obligation to receive care at these facilities.  PASRR submitted to EDS on       PASRR number received on       Existing PASRR number confirmed on 12/07/16     FL2 transmitted to all facilities in geographic area requested by pt/family on 12/07/16     FL2 transmitted to all facilities within larger geographic area on       Patient informed that his/her managed care company has contracts with or will negotiate with certain facilities, including the following:            Patient/family informed of bed offers received.  Patient chooses bed at       Physician recommends and patient chooses bed at      Patient to be transferred to   on  .  Patient to be transferred to facility by       Patient family notified on   of transfer.  Name of family member notified:        PHYSICIAN       Additional Comment:    _______________________________________________ Karn CassisStultz, Carolos Fecher Shanaberger, LCSW 12/07/2016, 12:09  PM [phone]

## 2016-12-07 NOTE — Progress Notes (Signed)
PROGRESS NOTE    [name]  ZOX:[id number] DOB: 08/24/1935 DOA: 12/06/2016 PCP: Galvin ProfferHAGUE, IMRAN P, MD   Brief Narrative:  81 y.o. female with hx of dementia, SNF resident with DNR code status, hx of HTN, anxiety, HLD, DM, seizure on dilantin,  hypothyrodism, hx of ICH, fell and has left hip pain, presented to the ER.  X ray of the hip showed valgus left subcapital Fx with mild impaction   Assessment & Plan:   Principal Problem:   Closed fracture of neck of left femur (HCC) - Orthopedic surgery consulted and assisting  - Continue supportive therapy  Active Problems:   Hypothyroidism - Continue Synthroid    Essential hypertension - Stable on lisinopril, metoprolol, and amlodipine    Type 2 diabetes mellitus with hyperglycemia (HCC) - Once able to have diet advance would advance to diabetic diet. Otherwise continue sliding scale insulin    Dementia - Stable continue home medication regimen    Partial symptomatic epilepsy with complex partial seizures, not intractable, without status epilepticus (HCC)   - stable continue Dilantin. No seizure-like activity reported    DVT prophylaxis: (As per orthopedic surgeon recommendations Code Status:  dnr Family Communication: none at bedside Disposition Plan: recommendations per PT   Consultants:   ortho   Procedures: please refer to ortho notes   Antimicrobials: clindamycin   Subjective: Pt in nad. Alert and awake  Objective: Vitals:   12/07/16 1245 12/07/16 1300 12/07/16 1315 12/07/16 1330  BP: 101/76 (!) 106/49 (!) 122/51 (!) 107/53  Pulse: 67 81 71 66  Resp: 15 12 12  (!) 23  Temp:      TempSrc:      SpO2: 93% 98% 92% 99%  Weight:      Height:        Intake/Output Summary (Last 24 hours) at 12/07/16 1405 Last data filed at 12/07/16 1213  Gross per 24 hour  Intake          1165.83 ml  Output              621 ml  Net           544.83 ml   Filed Weights   12/06/16 1733  Weight: 59 kg (130 lb 1.1 oz)     Examination:  General exam: Appears calm and comfortable  Respiratory system: Clear to auscultation. Respiratory effort normal. Cardiovascular system: S1 & S2 heard, RRR. No JVD, murmurs, rubs, gallops or clicks. Gastrointestinal system: Abdomen is nondistended, soft and nontender. No organomegaly or masses felt.  Central nervous system: Alert and awake. No focal neurological deficits. Extremities: no clubbing Skin: No rashes, warm and dry on limited exam. Psychiatry:  Mood & affect appropriate.     Data Reviewed: I have personally reviewed following labs and imaging studies  CBC:  Recent Labs Lab 12/06/16 1329 12/07/16 0613  WBC 9.0 6.6  NEUTROABS 6.0  --   HGB 13.8 12.0  HCT 40.8 36.8  MCV 88.5 89.1  PLT 127* 112*   Basic Metabolic Panel:  Recent Labs Lab 12/06/16 1329 12/07/16 0613  NA 139 139  K 3.4* 3.5  CL 106 107  CO2 25 26  GLUCOSE 128* 131*  BUN 12 15  CREATININE 0.65 0.78  CALCIUM 9.5 9.1   GFR: Estimated Creatinine Clearance: 51.3 mL/min (by C-G formula based on SCr of 0.78 mg/dL). Liver Function Tests: No results for input(s): AST, ALT, ALKPHOS, BILITOT, PROT, ALBUMIN in the last 168 hours. No results for input(s): LIPASE,  AMYLASE in the last 168 hours. No results for input(s): AMMONIA in the last 168 hours. Coagulation Profile: No results for input(s): INR, PROTIME in the last 168 hours. Cardiac Enzymes: No results for input(s): CKTOTAL, CKMB, CKMBINDEX, TROPONINI in the last 168 hours. BNP (last 3 results) No results for input(s): PROBNP in the last 8760 hours. HbA1C: No results for input(s): HGBA1C in the last 72 hours. CBG:  Recent Labs Lab 12/06/16 2355 12/07/16 0402 12/07/16 0820 12/07/16 0946 12/07/16 1253  GLUCAP 127* 167* 108* 104* 142*   Lipid Profile: No results for input(s): CHOL, HDL, LDLCALC, TRIG, CHOLHDL, LDLDIRECT in the last 72 hours. Thyroid Function Tests: No results for input(s): TSH, T4TOTAL, FREET4,  T3FREE, THYROIDAB in the last 72 hours. Anemia Panel: No results for input(s): VITAMINB12, FOLATE, FERRITIN, TIBC, IRON, RETICCTPCT in the last 72 hours. Sepsis Labs: No results for input(s): PROCALCITON, LATICACIDVEN in the last 168 hours.  Recent Results (from the past 240 hour(s))  Surgical pcr screen     Status: None   Collection Time: 12/06/16  7:31 PM  Result Value Ref Range Status   MRSA, PCR NEGATIVE NEGATIVE Final   Staphylococcus aureus NEGATIVE NEGATIVE Final    Comment:        The Xpert SA Assay (FDA approved for NASAL specimens in patients over 1 years of age), is one component of a comprehensive surveillance program.  Test performance has been validated by [facility] for patients greater than or equal to 83 year old. It is not intended to diagnose infection nor to guide or monitor treatment.          Radiology Studies: Dg Lumbar Spine Complete  Result Date: 12/06/2016 CLINICAL DATA:  Generalized low back pain following fall on concrete yesterday EXAM: LUMBAR SPINE - COMPLETE 4+ VIEW COMPARISON:  09/22/2016 FINDINGS: Postsurgical changes are again noted at L2-3. Compression deformity at L2 is again noted and stable. No new compression deformity is seen. No hardware failure is noted. Diffuse aortic calcifications are noted. Mild osteophytic changes are seen. IMPRESSION: Stable appearing L2 compression deformity when compared with the prior exam. No new focal abnormality is seen. Electronically Signed   By: Alcide Clever M.D.   On: 12/06/2016 13:39   Dg Hip Operative Unilat W Or W/o Pelvis Left  Result Date: 12/07/2016 CLINICAL DATA:  ORIF left hip. EXAM: OPERATIVE LEFT HIP (WITH PELVIS IF PERFORMED) TECHNIQUE: Fluoroscopic spot image(s) were submitted for interpretation post-operatively. COMPARISON:  12/06/2016 . FINDINGS: Patient status post surgical pinning left femoral neck fracture. Hardware intact. IMPRESSION: Postsurgical changes left hip. Electronically  Signed   By: Maisie Fus  Register   On: 12/07/2016 12:21   Dg Hip Unilat W Or Wo Pelvis 2-3 Views Left  Result Date: 12/06/2016 CLINICAL DATA:  Left hip pain after fall EXAM: DG HIP (WITH OR WITHOUT PELVIS) 2-3V LEFT COMPARISON:  Pelvic radiograph from 09/22/2016 FINDINGS: There is an acute, closed, valgus angulated subcapital left femoral neck fracture with slight impaction superolaterally. The bony pelvis appears intact. Right bipolar hip arthroplasty is partially imaged and unremarkable in appearance. There is lower lumbar degenerative disc and facet arthropathy. IMPRESSION: Acute, closed, valgus angulated subcapital left femoral neck fracture with slight impaction. Electronically Signed   By: Tollie Eth M.D.   On: 12/06/2016 15:23        Scheduled Meds: . amLODipine  10 mg Oral Daily  . [START ON 12/08/2016] aspirin EC  325 mg Oral Q breakfast  . citalopram  20 mg Oral Daily  .  clindamycin (CLEOCIN) IV  600 mg Intravenous Q6H  . donepezil  10 mg Oral Daily  . gabapentin  100 mg Oral QHS  . gabapentin  600 mg Oral TID  . insulin aspart  0-9 Units Subcutaneous Q4H  . levothyroxine  25 mcg Oral QAC breakfast  . lisinopril  40 mg Oral Daily  . LORazepam  0.5 mg Oral BID  . memantine  28 mg Oral Daily  . metoprolol tartrate  25 mg Oral BID  . pantoprazole  40 mg Oral Daily  . phenytoin  300 mg Oral QHS  . polyethylene glycol  17 g Oral Daily  . polyvinyl alcohol  1 drop Both Eyes BID   Continuous Infusions: . dextrose 5 % and 0.9% NaCl 50 mL/hr at 12/06/16 1757     LOS: 1 day    Time spent: > 35 minutes  Penny PiaVEGA, Velva Molinari, MD [facility] Pager [phone]  If 7PM-7AM, please contact night-coverage [email or web address] Password [email or web address] 12/07/2016, 2:05 PM

## 2016-12-07 NOTE — Brief Op Note (Signed)
12/06/2016 - 12/07/2016  12:04 PM  PATIENT:  [name]  81 y.o. female  PRE-OPERATIVE DIAGNOSIS:  left femoral neck fracture  POST-OPERATIVE DIAGNOSIS:  left femoral neck fracture  Surgery findings nondisplaced left femoral neck fracture  Implants Asnis titanium screws 3 with 3 washers were used to #75 screw and 1 #70 screw  Procedure as follows Patient identified in preop surgical site confirmed left hip surgical site marked chart reviewed  Patient taken to surgery spinal anesthetic was given patient placed on fracture table Right leg padded placed in well leg holder left leg placed in traction  Left leg internally rotated C-arm brought in fracture was reduced  Gave the okay to prepped and draped sterilely timeout completed  Incision made over the greater trochanter extended distally subtenons tissue divided Fascial split in line with skin incision Vastus lateralis identified incised in line with skin incision Cautery used to coagulate perforating vessels Subperiosteal dissection expose proximal femur Multi pin guide was placed 3 K wires were placed using the C-arm to get them in the femoral head and the appropriate position  Once the screws were in good position each was measured and the cortex was drilled and then 3 screws were placed with washers  Final x-rays confirm adequate reduction and pin placement and screw placement  Wound irrigated and closed in layered fashion with Surgilon Monocryl in 2 layers  Staples  I injected a total of 60 mL of Marcaine with epinephrine   PROCEDURE:  Procedure(s): CANNULATED HIP PINNING (Left)  SURGEON:  Surgeon(s) and Role:    * Vickki HearingStanley E Harrison, MD - Primary  PHYSICIAN ASSISTANT:   ASSISTANTS: none   ANESTHESIA:   none  EBL:  Total I/O In: 400 [I.V.:400] Out: 120 [Urine:100; Blood:20]  BLOOD ADMINISTERED:none  DRAINS: none   LOCAL MEDICATIONS USED:  MARCAINE     SPECIMEN:  No Specimen  DISPOSITION OF  SPECIMEN:  N/A  COUNTS:  YES  TOURNIQUET:  * No tourniquets in log *  DICTATION: .Dragon Dictation  PLAN OF CARE: Admit to inpatient   PATIENT DISPOSITION:  PACU - hemodynamically stable.   Delay start of Pharmacological VTE agent (>24hrs) due to surgical blood loss or risk of bleeding: yes  [id number]

## 2016-12-07 NOTE — Clinical Social Work Placement (Deleted)
   CLINICAL SOCIAL WORK PLACEMENT  NOTE  Date:  12/07/2016  Patient Details  Name: [name] MRN: [id number] Date of Birth: 10/13/1935  Clinical Social Work is seeking post-discharge placement for this patient at the Skilled  Nursing Facility level of care (*CSW will initial, date and re-position this form in  chart as items are completed):  Yes   Patient/family provided with [place] Clinical Social Work Department's list of facilities offering this level of care within the geographic area requested by the patient (or if unable, by the patient's family).  Yes   Patient/family informed of their freedom to choose among providers that offer the needed level of care, that participate in Medicare, Medicaid or managed care program needed by the patient, have an available bed and are willing to accept the patient.  Yes   Patient/family informed of [facility]'s ownership interest in [facility] and [facility], as well as of the fact that they are under no obligation to receive care at these facilities.  PASRR submitted to EDS on       PASRR number received on       Existing PASRR number confirmed on 12/07/16     FL2 transmitted to all facilities in geographic area requested by pt/family on 12/07/16     FL2 transmitted to all facilities within larger geographic area on       Patient informed that his/her managed care company has contracts with or will negotiate with certain facilities, including the following:        Yes   Patient/family informed of bed offers received.  [facility]     Physician recommends and patient chooses bed at      [facility]  .  Patient to be transferred to facility by       Patient family notified on   of transfer.  Name of family member notified:        PHYSICIAN       Additional Comment:    _______________________________________________ Karn CassisStultz, Xayne Brumbaugh Shanaberger,  LCSW 12/07/2016, 2:35 PM [phone]

## 2016-12-07 NOTE — Clinical Social Work Note (Signed)
CSW received call from daughter, Stanton KidneyDebra who states that she has spoken to pt's husband and they would like to check and see if [name] or [facility] have bed available. They [facility] to [facility] if these SNFs cannot accept pt.   Derenda FennelKara Bryler Dibble, LCSW [phone]

## 2016-12-07 NOTE — NC FL2 (Deleted)
[place] MEDICAID FL2 LEVEL OF CARE SCREENING TOOL     IDENTIFICATION  Patient Name: [name] Birthdate: 1935/03/26 Sex: female Admission Date (Current Location): 12/06/2016  [facility] and [place] Number:  [employer] and Address:  [facility],  [name] [place] [address]      Provider Number: [id number]  Attending Physician Name and Address:  Houston Siren, MD  Relative Name and Phone Number:       Current Level of Care: Hospital Recommended Level of Care: Skilled Nursing Facility Prior Approval Number:    Date Approved/Denied:   PASRR Number: [id number]  Discharge Plan: SNF    Current Diagnoses: Patient Active Problem List   Diagnosis Date Noted  . Closed fracture of neck of left femur (HCC)   . Closed left hip fracture (HCC) 12/06/2016  . Right hip pain   . Palliative care encounter   . Encounter for hospice care discussion   . Partial symptomatic epilepsy with complex partial seizures, not intractable, without status epilepticus (HCC)   . Altered mental status 07/29/2016  . Hypokalemia 07/29/2016  . Displaced fracture of right femoral neck (HCC) 05/07/2016  . Hip fracture (HCC) 05/04/2016  . Closed right hip fracture (HCC) 05/04/2016  . Type 2 diabetes mellitus with hyperglycemia (HCC) 05/04/2016  . Dementia 05/04/2016  . Closed fracture of part of upper end of humerus 01/03/2015  . Hypothyroidism 12/19/2010  . DIABETES MELLITUS, TYPE II 12/19/2010  . DEMENTIA 12/19/2010  . ANXIETY 12/19/2010  . Essential hypertension 12/19/2010  . DIZZINESS 12/19/2010  . FATIGUE 12/19/2010  . HEADACHE 12/19/2010    Orientation RESPIRATION BLADDER Height & Weight     Self, Place  Normal Indwelling catheter Weight: 130 lb 1.1 oz (59 kg) Height:  5\' 5"  (165.1 cm)  BEHAVIORAL SYMPTOMS/MOOD NEUROLOGICAL BOWEL NUTRITION STATUS  Other (Comment) (none) Convulsions/Seizures (history) Incontinent Diet (Soft diet. See d/c summary for updates)   AMBULATORY STATUS COMMUNICATION OF NEEDS Skin   Extensive Assist Verbally Surgical wounds                       Personal Care Assistance Level of Assistance  Bathing, Feeding, Dressing Bathing Assistance: Maximum assistance Feeding assistance: Maximum assistance Dressing Assistance: Maximum assistance     Functional Limitations Info  Sight, Hearing, Speech Sight Info: Adequate Hearing Info: Adequate Speech Info: Adequate    SPECIAL CARE FACTORS FREQUENCY  PT (By licensed PT)     PT Frequency: daily              Contractures      Additional Factors Info  Insulin Sliding Scale Code Status Info: DNR Allergies Info: Penicillins, Promethazine Hcl, Sulfonamide Derivatives           Current Medications (12/07/2016):  This is the current hospital active medication list Current Facility-Administered Medications  Medication Dose Route Frequency Provider Last Rate Last Dose  . [MAR Hold] 0.9 %  sodium chloride infusion   Intravenous Once Vickki Hearing, MD      . Mitzi Hansen Hold] acetaminophen (TYLENOL) tablet 500 mg  500 mg Oral Q8H Houston Siren, MD   500 mg at 12/07/16 0416  . [MAR Hold] amLODipine (NORVASC) tablet 10 mg  10 mg Oral Daily Houston Siren, MD   10 mg at 12/07/16 0855  . [MAR Hold] aspirin chewable tablet 81 mg  81 mg Oral Daily Houston Siren, MD   81 mg at 12/07/16 0854  . [MAR Hold] citalopram (CELEXA)  tablet 20 mg  20 mg Oral Daily Houston SirenPeter Le, MD   20 mg at 12/07/16 0856  . dextrose 5 %-0.9 % sodium chloride infusion   Intravenous Continuous Houston SirenPeter Le, MD 50 mL/hr at 12/06/16 1757    . [MAR Hold] donepezil (ARICEPT) tablet 10 mg  10 mg Oral Daily Houston SirenPeter Le, MD   10 mg at 12/07/16 0855  . fentaNYL (SUBLIMAZE) injection 25-50 mcg  25-50 mcg Intravenous Q5 min PRN Laurene FootmanLuis Gonzalez, MD      . Mitzi Hansen[MAR Hold] gabapentin (NEURONTIN) capsule 100 mg  100 mg Oral QHS Houston SirenPeter Le, MD   100 mg at 12/06/16 2001  . [MAR Hold] gabapentin (NEURONTIN) capsule 600 mg  600 mg Oral TID Houston SirenPeter Le, MD    600 mg at 12/07/16 0855  . [MAR Hold] HYDROmorphone (DILAUDID) injection 0.5 mg  0.5 mg Intravenous Q2H PRN Houston SirenPeter Le, MD   0.5 mg at 12/06/16 1800  . [MAR Hold] insulin aspart (novoLOG) injection 0-9 Units  0-9 Units Subcutaneous Q4H Houston SirenPeter Le, MD   2 Units at 12/07/16 0416  . lactated ringers infusion   Intravenous Continuous Laurene FootmanLuis Gonzalez, MD 50 mL/hr at 12/07/16 1004    . [MAR Hold] levothyroxine (SYNTHROID, LEVOTHROID) tablet 25 mcg  25 mcg Oral QAC breakfast Houston SirenPeter Le, MD   25 mcg at 12/07/16 0854  . [MAR Hold] lisinopril (PRINIVIL,ZESTRIL) tablet 40 mg  40 mg Oral Daily Houston SirenPeter Le, MD   40 mg at 12/07/16 0855  . [MAR Hold] LORazepam (ATIVAN) tablet 0.5 mg  0.5 mg Oral BID Houston SirenPeter Le, MD   0.5 mg at 12/06/16 2000  . [MAR Hold] memantine (NAMENDA XR) 24 hr capsule 28 mg  28 mg Oral Daily Houston SirenPeter Le, MD   28 mg at 12/06/16 2125  . [MAR Hold] metoprolol tartrate (LOPRESSOR) tablet 25 mg  25 mg Oral BID Houston SirenPeter Le, MD   25 mg at 12/07/16 0856  . midazolam (VERSED) injection 1-2 mg  1-2 mg Intravenous Q5 min PRN Laurene FootmanLuis Gonzalez, MD   1 mg at 12/07/16 1008  . [MAR Hold] ondansetron (ZOFRAN) tablet 4 mg  4 mg Oral Q6H PRN Houston SirenPeter Le, MD       Or  . Mitzi Hansen[MAR Hold] ondansetron [facility](ZOFRAN) injection 4 mg  4 mg Intravenous Q6H PRN Houston SirenPeter Le, MD      . Mitzi Hansen[MAR Hold] pantoprazole (PROTONIX) EC tablet 40 mg  40 mg Oral Daily Houston SirenPeter Le, MD   40 mg at 12/07/16 0855  . [MAR Hold] phenytoin (DILANTIN) ER capsule 300 mg  300 mg Oral QHS Houston SirenPeter Le, MD   300 mg at 12/06/16 2000  . [MAR Hold] polyethylene glycol (MIRALAX / GLYCOLAX) packet 17 g  17 g Oral Daily Houston SirenPeter Le, MD   17 g at 12/06/16 1757  . [MAR Hold] polyvinyl alcohol (LIQUIFILM TEARS) 1.4 % ophthalmic solution 1 drop  1 drop Both Eyes BID Houston SirenPeter Le, MD   1 drop at 12/07/16 0856   Facility-Administered Medications Ordered in Other Encounters  Medication Dose Route Frequency Provider Last Rate Last Dose  . bupivacaine 0.75% in dextrose 8.25% (intrathecal) (SENSORCAINE) 0.75-8.25 %  injection   Intrathecal Anesthesia Intra-op Amy A Adams, CRNA   13 mg at 12/07/16 1053  . ePHEDrine injection    Anesthesia Intra-op Amy A Pernell DupreAdams, CRNA   10 mg at 12/07/16 1114  . fentaNYL (SUBLIMAZE) injection    Anesthesia Intra-op Earleen NewportAmy A Adams, CRNA   25 mcg at 12/07/16 1053  . midazolam (VERSED)  5 MG/5ML injection    Anesthesia Intra-op Amy A Adams, CRNA   0.5 mg at 12/07/16 1042  . propofol (DIPRIVAN) 500 MG/50ML infusion    Continuous PRN Amy A Adams, CRNA 3.5 mL/hr at 12/07/16 1131 10 mcg/kg/min at 12/07/16 1131     Discharge Medications: Please see discharge summary for a list of discharge medications.  Relevant Imaging Results:  Relevant Lab Results:   Additional Information SSN: [id number]  Karn CassisStultz, Rahmon Heigl [place], [place]CS[place] [phone]

## 2016-12-07 NOTE — Progress Notes (Signed)
CBC Latest Ref Rng & Units 12/07/2016 12/06/2016 08/08/2016  WBC 4.0 - 10.5 K/uL 6.6 9.0 12.0(H)  Hemoglobin 12.0 - 15.0 g/dL 16.112.0 09.613.8 04.512.2  Hematocrit 36.0 - 46.0 % 36.8 40.8 36.5  Platelets 150 - 400 K/uL PENDING 127(L) 189   BMP Latest Ref Rng & Units 12/07/2016 12/06/2016 08/08/2016  Glucose 65 - 99 mg/dL 409(W131(H) 119(J128(H) 478(G163(H)  BUN 6 - 20 mg/dL 15 12 6   Creatinine 0.44 - 1.00 mg/dL 9.560.78 2.130.65 0.860.62  Sodium 135 - 145 mmol/L 139 139 134(L)  Potassium 3.5 - 5.1 mmol/L 3.5 3.4(L) 3.4(L)  Chloride 101 - 111 mmol/L 107 106 102  CO2 22 - 32 mmol/L 26 25 27   Calcium 8.9 - 10.3 mg/dL 9.1 9.5 9.1

## 2016-12-07 NOTE — Clinical Social Work Note (Signed)
Clinical Social Work Assessment  Patient Details  Name: [name] MRN: [id number] Date of Birth: 06/26/1935  Date of referral:  12/07/16               Reason for consult:  Discharge Planning                Permission sought to share information with:    Permission granted to share information::     Name::        Agency::     Relationship::     Contact Information:     Housing/Transportation Living arrangements for the past 2 months:  Assisted Living Facility Source of Information:  Spouse Patient Interpreter Needed:  None Criminal Activity/Legal Involvement Pertinent to Current Situation/Hospitalization:  No - Comment as needed Significant Relationships:  Spouse Lives with:  Facility Resident Do you feel safe going back to the place where you live?   (needs higher level of care) Need for family participation in patient care:  Yes (Comment)  Care giving concerns:  Pt will most likely require higher level of care.    Social Worker assessment / plan:  CSW spoke with pt's husband on phone. Pt is currently in surgery. She fell at facility and fractured her hip. Per husband, pt has been a resident there for about 4 years and is on memory care unit. Her husband is wheelchair bound and reports he visits when he can. Discussed with husband that pt will likely need SNF for rehab and he agrees. Requests [name]. He is aware of Medicare coverage/criteria. Pt ambulates with a walker at baseline.   Employment status:  Retired Health and safety inspectornsurance information:  Medicare PT Recommendations:  Not assessed at this time Information / Referral to community resources:  Skilled Nursing Facility  Patient/Family's Response to care:  Pt's husband agreeable to SNF.   Patient/Family's Understanding of and Emotional Response to Diagnosis, Current Treatment, and Prognosis:  Pt's husband shares that pt has had hospice at facility for almost a year. He plans to notify [facility] of plans. [facility] reports that daughter has paperwork to revoke hospice in order to go to rehab.   Emotional Assessment Appearance:  Appears stated age Attitude/Demeanor/Rapport:  Unable to Assess Affect (typically observed):  Unable to Assess Orientation:  Oriented to Self, Oriented to Place Alcohol / Substance use:  Not Applicable Psych involvement (Current and /or in the community):  No (Comment)  Discharge Needs  Concerns to be addressed:  Discharge Planning Concerns Readmission within the last 30 days:  No Current discharge risk:  Physical Impairment, Cognitively Impaired Barriers to Discharge:  Continued Medical Work up   Mila Doce Northern Santa FeStultz, Jasir Rother [place], LCSW 12/07/2016, 12:22 PM [phone]

## 2016-12-07 NOTE — Consult Note (Signed)
Reason for Consult: Left hip fracture Referring Physician: DR Mamie Nick [name] is an 80 y.o. female.  HPI: 81 year old female with dementia status post anterior approach hemiarthroplasty in May of this year fell again and injured her left hip. She brought to the ER complaining of hip pain inability to walk. X-rays show a left femoral neck fracture nondisplaced.  She was admitted for treatment of fracture.  MILD NON RADIATING DULL LEFT HIP PAIN X 2 DAYS  Past Medical History:  Diagnosis Date  . Alzheimer disease    per facility paperwork dated 05/04/2016  . Anxiety   . Dementia   . Diabetes mellitus   . Hypercholesteremia   . Hypertension   . Intracranial bleeding (HCC)    10 years ago  . Seizures ([place])    approx 10 years when she had brain bleed  . Thyroid disease     Past Surgical History:  Procedure Laterality Date  . ABDOMINAL HYSTERECTOMY    . ANTERIOR APPROACH HEMI HIP ARTHROPLASTY Right 05/07/2016   Procedure: ANTERIOR APPROACH HEMI HIP ARTHROPLASTY VERSUS TOTAL HIP ARTHROPLASTY;  Surgeon: Rod Can, MD;  Location: [facility];  Service: Orthopedics;  Laterality: Right;  . CHOLECYSTECTOMY      Family History  Problem Relation Age of Onset  . Hypertension Other     Social History:  reports that she has quit smoking. She has quit using smokeless tobacco. She reports that she does not drink alcohol or use drugs.  Allergies:  Allergies  Allergen Reactions  . Penicillins Nausea And Vomiting    Has patient had a PCN reaction causing immediate rash, facial/tongue/throat swelling, SOB or lightheadedness with hypotension: {unknown Has patient had a PCN reaction causing severe rash involving mucus membranes or skin necrosis: unknown Has patient had a PCN reaction that required hospitalization: unknown Has patient had a PCN reaction occurring within the last 10 years: unknown If all of the above answers are "NO", then may proceed with Cephalosporin use.   .  Promethazine Hcl Nausea And Vomiting  . Sulfonamide Derivatives Other (See Comments)    unknown    Medications: I have reviewed the patient's current medications.  Current Meds  Medication Sig  . acetaminophen (TYLENOL) 500 MG tablet Take 1 tablet (500 mg total) by mouth every 8 (eight) hours as needed for headache. (Patient taking differently: Take 1,000 mg by mouth every 8 (eight) hours. )  . amLODipine (NORVASC) 5 MG tablet Take 2 tablets (10 mg total) by mouth daily.  . Artificial Tear Solution (SOOTHE XP OP) Apply 1 drop to eye 2 (two) times daily.  [name] aspirin 81 MG chewable tablet Chew 1 tablet (81 mg total) by mouth daily.  . citalopram (CELEXA) 20 MG tablet Take 20 mg by mouth daily.  . feeding supplement, ENSURE ENLIVE, (ENSURE ENLIVE) LIQD Take 237 mLs by mouth 2 (two) times daily between meals.  [name] levothyroxine (SYNTHROID, LEVOTHROID) 25 MCG tablet Take 25 mcg by mouth daily before breakfast.  . lisinopril (PRINIVIL,ZESTRIL) 40 MG tablet Take 40 mg by mouth daily.  . Memantine HCl-Donepezil HCl (NAMZARIC) 28-10 MG CP24 Take 1 tablet by mouth daily.  . metoprolol tartrate (LOPRESSOR) 25 MG tablet Take 25 mg by mouth 2 (two) times daily.  . polyethylene glycol (MIRALAX / GLYCOLAX) packet Take 17 g by mouth daily.    Results for orders placed or performed during the hospital encounter of 12/06/16 (from the past 48 hour(s))  Urinalysis, Routine w reflex microscopic  Status: None   Collection Time: 12/06/16 12:30 PM  Result Value Ref Range   Color, Urine YELLOW YELLOW   APPearance CLEAR CLEAR   Specific Gravity, Urine 1.020 1.005 - 1.030   pH 6.0 5.0 - 8.0   Glucose, UA NEGATIVE NEGATIVE mg/dL   Hgb urine dipstick NEGATIVE NEGATIVE   Bilirubin Urine NEGATIVE NEGATIVE   Ketones, ur NEGATIVE NEGATIVE mg/dL   Protein, ur NEGATIVE NEGATIVE mg/dL   Nitrite NEGATIVE NEGATIVE   Leukocytes, UA NEGATIVE NEGATIVE    Comment: Microscopic not done on urines with negative protein,  blood, leukocytes, nitrite, or glucose < 500 mg/dL.  CBC with Differential     Status: Abnormal   Collection Time: 12/06/16  1:29 PM  Result Value Ref Range   WBC 9.0 4.0 - 10.5 K/uL   RBC 4.61 3.87 - 5.11 MIL/uL   Hemoglobin 13.8 12.0 - 15.0 g/dL   HCT 40.8 36.0 - 46.0 %   MCV 88.5 78.0 - 100.0 fL   MCH 29.9 26.0 - 34.0 pg   MCHC 33.8 30.0 - 36.0 g/dL   RDW 13.6 11.5 - 15.5 %   Platelets 127 (L) 150 - 400 K/uL   Neutrophils Relative % 66 %   Neutro Abs 6.0 1.7 - 7.7 K/uL   Lymphocytes Relative 18 %   Lymphs Abs 1.7 0.7 - 4.0 K/uL   Monocytes Relative 11 %   Monocytes Absolute 1.0 0.1 - 1.0 K/uL   Eosinophils Relative 4 %   Eosinophils Absolute 0.4 0.0 - 0.7 K/uL   Basophils Relative 1 %   Basophils Absolute 0.1 0.0 - 0.1 K/uL  Basic metabolic panel     Status: Abnormal   Collection Time: 12/06/16  1:29 PM  Result Value Ref Range   Sodium 139 135 - 145 mmol/L   Potassium 3.4 (L) 3.5 - 5.1 mmol/L   Chloride 106 101 - 111 mmol/L   CO2 25 22 - 32 mmol/L   Glucose, Bld 128 (H) 65 - 99 mg/dL   BUN 12 6 - 20 mg/dL   Creatinine, Ser 0.65 0.44 - 1.00 mg/dL   Calcium 9.5 8.9 - 10.3 mg/dL   GFR calc non Af Amer >60 >60 mL/min   GFR calc Af Amer >60 >60 mL/min    Comment: (NOTE) The eGFR has been calculated using the CKD EPI equation. This calculation has not been validated in all clinical situations. eGFR's persistently <60 mL/min signify possible Chronic Kidney Disease.    Anion gap 8 5 - 15  CBG monitoring, ED     Status: Abnormal   Collection Time: 12/06/16  4:29 PM  Result Value Ref Range   Glucose-Capillary 142 (H) 65 - 99 mg/dL  Surgical pcr screen     Status: None   Collection Time: 12/06/16  7:31 PM  Result Value Ref Range   MRSA, PCR NEGATIVE NEGATIVE   Staphylococcus aureus NEGATIVE NEGATIVE    Comment:        The Xpert SA Assay (FDA approved for NASAL specimens in patients over 70 years of age), is one component of a comprehensive surveillance program.  Test  performance has been validated by [facility] for patients greater than or equal to 39 year old. It is not intended to diagnose infection nor to guide or monitor treatment.   Glucose, capillary     Status: Abnormal   Collection Time: 12/06/16  7:59 PM  Result Value Ref Range   Glucose-Capillary 168 (H) 65 - 99 mg/dL  Type and screen [facility]     Status: None (Preliminary result)   Collection Time: 12/06/16  9:27 PM  Result Value Ref Range   ISSUE DATE / TIME [id number]    Blood Product Unit Number [id number]    PRODUCT CODE E0336V00    Unit Type and Rh 9500    Blood Product Expiration Date 342876811572    Blood Product Unit Number [id number]    Unit Type and Rh 5100    Blood Product Expiration Date 453646803212    Blood Product Unit Number [id number]    Unit Type and Rh 5100    Blood Product Expiration Date 891694503888   Prepare RBC     Status: None   Collection Time: 12/06/16  9:33 PM  Result Value Ref Range   Order Confirmation ORDER PROCESSED BY BLOOD BANK   Glucose, capillary     Status: Abnormal   Collection Time: 12/06/16 11:55 PM  Result Value Ref Range   Glucose-Capillary 127 (H) 65 - 99 mg/dL  Glucose, capillary     Status: Abnormal   Collection Time: 12/07/16  4:02 AM  Result Value Ref Range   Glucose-Capillary 167 (H) 65 - 99 mg/dL  Basic metabolic panel     Status: Abnormal   Collection Time: 12/07/16  6:13 AM  Result Value Ref Range   Sodium 139 135 - 145 mmol/L   Potassium 3.5 3.5 - 5.1 mmol/L   Chloride 107 101 - 111 mmol/L   CO2 26 22 - 32 mmol/L   Glucose, Bld 131 (H) 65 - 99 mg/dL   BUN 15 6 - 20 mg/dL   Creatinine, Ser 0.78 0.44 - 1.00 mg/dL   Calcium 9.1 8.9 - 10.3 mg/dL   GFR calc non Af Amer >60 >60 mL/min   GFR calc Af Amer >60 >60 mL/min    Comment: (NOTE) The eGFR has been calculated using the CKD EPI equation. This calculation has not been validated in all clinical situations. eGFR's persistently <60 mL/min signify  possible Chronic Kidney Disease.    Anion gap 6 5 - 15  CBC     Status: Abnormal   Collection Time: 12/07/16  6:13 AM  Result Value Ref Range   WBC 6.6 4.0 - 10.5 K/uL   RBC 4.13 3.87 - 5.11 MIL/uL   Hemoglobin 12.0 12.0 - 15.0 g/dL   HCT 36.8 36.0 - 46.0 %   MCV 89.1 78.0 - 100.0 fL   MCH 29.1 26.0 - 34.0 pg   MCHC 32.6 30.0 - 36.0 g/dL   RDW 13.2 11.5 - 15.5 %   Platelets 112 (L) 150 - 400 K/uL    Comment: SPECIMEN CHECKED FOR CLOTS LARGE PLATELETS PRESENT PLATELET COUNT CONFIRMED BY SMEAR     Dg Lumbar Spine Complete  Result Date: 12/06/2016 CLINICAL DATA:  Generalized low back pain following fall on concrete yesterday EXAM: LUMBAR SPINE - COMPLETE 4+ VIEW COMPARISON:  09/22/2016 FINDINGS: Postsurgical changes are again noted at L2-3. Compression deformity at L2 is again noted and stable. No new compression deformity is seen. No hardware failure is noted. Diffuse aortic calcifications are noted. Mild osteophytic changes are seen. IMPRESSION: Stable appearing L2 compression deformity when compared with the prior exam. No new focal abnormality is seen. Electronically Signed   By: Inez Catalina M.D.   On: 12/06/2016 13:39   Dg Hip Unilat W Or Wo Pelvis 2-3 Views Left  Result Date: 12/06/2016 CLINICAL DATA:  Left hip pain after fall EXAM: DG HIP (  WITH OR WITHOUT PELVIS) 2-3V LEFT COMPARISON:  Pelvic radiograph from 09/22/2016 FINDINGS: There is an acute, closed, valgus angulated subcapital left femoral neck fracture with slight impaction superolaterally. The bony pelvis appears intact. Right bipolar hip arthroplasty is partially imaged and unremarkable in appearance. There is lower lumbar degenerative disc and facet arthropathy. IMPRESSION: Acute, closed, valgus angulated subcapital left femoral neck fracture with slight impaction. Electronically Signed   By: Ashley Royalty M.D.   On: 12/06/2016 15:23    Review of Systems  Unable to perform ROS: Mental acuity   Blood pressure 126/60,  pulse (!) 58, temperature 97.6 F (36.4 C), temperature source Axillary, resp. rate 18, height '5\' 5"'$  (1.651 m), weight 130 lb 1.1 oz (59 kg), SpO2 100 %. Physical Exam  Constitutional: She appears well-developed and well-nourished. She is cooperative.  Non-toxic appearance. She does not have a sickly appearance. She does not appear ill. No distress.  HENT:  Head: Normocephalic and atraumatic. Head is without raccoon's eyes.  Right Ear: External ear normal.  Left Ear: External ear normal.  Nose: Nose normal.  Mouth/Throat: No oropharyngeal exudate.  Eyes: Conjunctivae and EOM are normal. Pupils are equal, round, and reactive to light. Right eye exhibits no discharge. Left eye exhibits no discharge. Right conjunctiva is not injected. Left conjunctiva is not injected. No scleral icterus. Pupils are equal.  Neck: Normal range of motion. Neck supple. No JVD present. No tracheal deviation present. No thyromegaly present.  Cardiovascular: Normal rate, regular rhythm, normal heart sounds and intact distal pulses.   Pulses:      Dorsalis pedis pulses are 2+ on the right side, and 2+ on the left side.       Posterior tibial pulses are 2+ on the right side, and 2+ on the left side.  Respiratory: Effort normal and breath sounds normal. No stridor. No respiratory distress. She exhibits no tenderness.  GI: Soft. Bowel sounds are normal. She exhibits no distension and no mass.  Musculoskeletal:       Right shoulder: Normal. She exhibits normal range of motion, no tenderness, no bony tenderness, no swelling, no effusion, no crepitus, no deformity, no laceration, no pain, no spasm, normal pulse and normal strength.       Left shoulder: She exhibits normal range of motion, no tenderness, no bony tenderness, no swelling, no effusion, no crepitus, no deformity, no laceration, no pain, no spasm, normal pulse and normal strength.       Right hip: Normal. She exhibits normal range of motion, normal strength, no  tenderness, no bony tenderness, no swelling, no crepitus, no deformity and no laceration.       Left hip: She exhibits decreased range of motion, tenderness, bony tenderness and deformity. She exhibits normal strength, no swelling, no crepitus and no laceration.       Right knee: Normal.       Right upper arm: Normal. She exhibits no tenderness, no bony tenderness, no swelling, no edema, no deformity and no laceration.       Left upper arm: Normal. She exhibits no tenderness, no bony tenderness, no swelling, no edema, no deformity and no laceration.       Right upper leg: Normal.       Left upper leg: She exhibits tenderness, bony tenderness and deformity.       Left lower leg: She exhibits no tenderness, no bony tenderness, no swelling, no edema, no deformity and no laceration.  Lymphadenopathy:    She has cervical adenopathy.  Right: No inguinal adenopathy present.       Left: No inguinal adenopathy present.  Neurological: She is alert. She exhibits normal muscle tone.  Skin: Skin is warm and dry. No abrasion, no ecchymosis and no rash noted. She is not diaphoretic. No erythema. No pallor.  Psychiatric: Her speech is normal and behavior is normal. Her mood appears not anxious. Her affect is blunt. Her affect is not angry. Thought content is not paranoid and not delusional. Cognition and memory are not impaired. She does not exhibit a depressed mood.    Assessment/Plan: X-ray show left femoral neck fracture nondisplaced  Patient has dementia  Femoral neck fracture needs cannulated screws.  Plan and diagnosis left femoral neck fracture nondisplaced  Plan open treatment internal fixation with cannulated screws left hip  Arther Abbott 12/07/2016, 8:04 AM

## 2016-12-07 NOTE — Anesthesia Procedure Notes (Signed)
Procedure Name: MAC Date/Time: 12/07/2016 10:39 AM Performed by: Pernell DupreADAMS, Arryn Terrones A Pre-anesthesia Checklist: Patient identified, Timeout performed, Emergency Drugs available, Suction available and Patient being monitored Oxygen Delivery Method: Simple face mask

## 2016-12-07 NOTE — Transfer of Care (Signed)
Immediate Anesthesia Transfer of Care Note  Patient: Arloa KohBeverly J Elwell  Procedure(s) Performed: Procedure(s): CANNULATED HIP PINNING (Left)  Patient Location: PACU  Anesthesia Type:Spinal  Level of Consciousness: awake and patient cooperative  Airway & Oxygen Therapy: Patient Spontanous Breathing  Post-op Assessment: Report given to RN and Post -op Vital signs reviewed and stable  Post vital signs: Reviewed and stable  Last Vitals:  Vitals:   12/07/16 1005 12/07/16 1010  BP: 126/68 122/66  Pulse:    Resp: 12 15  Temp:      Last Pain:  Vitals:   12/07/16 0949  TempSrc: Oral      Patients Stated Pain Goal:  (unable to assess  patent confused) (12/07/16 0949)  Complications: No apparent anesthesia complications

## 2016-12-07 NOTE — Clinical Social Work Placement (Signed)
   CLINICAL SOCIAL WORK PLACEMENT  NOTE  Date:  12/07/2016  Patient Details  Name: Don PerkingBeverly J Givan MRN: [id number] Date of Birth: 02/24/1935  Clinical Social Work is seeking post-discharge placement for this patient at the Skilled  Nursing Facility level of care (*CSW will initial, date and re-position this form in  chart as items are completed):  Yes   Patient/family provided with [facility] Clinical Social Work Department's list of facilities offering this level of care within the geographic area requested by the patient (or if unable, by the patient's family).  Yes   Patient/family informed of their freedom to choose among providers that offer the needed level of care, that participate in Medicare, Medicaid or managed care program needed by the patient, have an available bed and are willing to accept the patient.  Yes   Patient/family informed of [facility]'s ownership interest in [facility] and [facility], as well as of the fact that they are under no obligation to receive care at these facilities.  PASRR submitted to EDS on       PASRR number received on       Existing PASRR number confirmed on 12/07/16     FL2 transmitted to all facilities in geographic area requested by pt/family on 12/07/16     FL2 transmitted to all facilities within larger geographic area on       Patient informed that his/her managed care company has contracts with or will negotiate with certain facilities, including the following:        Yes   Patient/family informed of bed offers received.  [facility]     Physician recommends and patient chooses bed at      [facility]  .  Patient to be transferred to facility by       Patient family notified on   of transfer.  Name of family member notified:        PHYSICIAN       Additional Comment:    _______________________________________________ Karn CassisStultz, Tekeshia Klahr Shanaberger,  LCSW 12/07/2016, 2:33 PM [phone]

## 2016-12-07 NOTE — Anesthesia Postprocedure Evaluation (Signed)
Anesthesia Post Note  Patient: [name]  Procedure(s) Performed: Procedure(s) (LRB): CANNULATED HIP PINNING (Left)  Patient location during evaluation: PACU Anesthesia Type: Spinal Level of consciousness: awake and alert Vital Signs Assessment: post-procedure vital signs reviewed and stable Respiratory status: spontaneous breathing Postop Assessment: spinal receding and no signs of nausea or vomiting Anesthetic complications: no    Last Vitals:  Vitals:   12/07/16 1315 12/07/16 1330  BP: (!) 122/51 (!) 107/53  Pulse: 71 66  Resp: 12 (!) 23  Temp:      Last Pain:  Vitals:   12/07/16 0949  TempSrc: Oral                 [name]

## 2016-12-07 NOTE — Clinical Social Work Placement (Deleted)
   CLINICAL SOCIAL WORK PLACEMENT  NOTE  Date:  12/07/2016  Patient Details  Name: [name] MRN: [id number] Date of Birth: 08/05/1935  Clinical Social Work is seeking post-discharge placement for this patient at the Skilled  Nursing Facility level of care (*CSW will initial, date and re-position this form in  chart as items are completed):  Yes   Patient/family provided with [facility] Clinical Social Work Department's list of facilities offering this level of care within the geographic area requested by the patient (or if unable, by the patient's family).  Yes   Patient/family informed of their freedom to choose among providers that offer the needed level of care, that participate in Medicare, Medicaid or managed care program needed by the patient, have an available bed and are willing to accept the patient.  Yes   Patient/family informed of [facility]'s ownership interest in [facility] and [facility], as well as of the fact that they are under no obligation to receive care at these facilities.  PASRR submitted to EDS on       PASRR number received on       Existing PASRR number confirmed on 12/07/16     FL2 transmitted to all facilities in geographic area requested by pt/family on 12/07/16     FL2 transmitted to all facilities within larger geographic area on       Patient informed that his/her managed care company has contracts with or will negotiate with certain facilities, including the following:            Patient/family informed of bed offers received.  Patient chooses bed at       Physician recommends and patient chooses bed at      Patient to be transferred to   on  .  Patient to be transferred to facility by       Patient family notified on   of transfer.  Name of family member notified:        PHYSICIAN       Additional Comment:    _______________________________________________ Karn CassisStultz, Inis Borneman Shanaberger, LCSW 12/07/2016, 12:10  PM [phone]

## 2016-12-07 NOTE — Op Note (Signed)
12/07/2016  12:04 PM  PATIENT:  Arloa KohBeverly J Eutsler  81 y.o. female  PRE-OPERATIVE DIAGNOSIS:  left femoral neck fracture  POST-OPERATIVE DIAGNOSIS:  left femoral neck fracture  Surgery findings nondisplaced left femoral neck fracture  Implants Asnis titanium screws 3 with 3 washers were used to #75 screw and 1 #70 screw  Procedure as follows Patient identified in preop surgical site confirmed left hip surgical site marked chart reviewed  Patient taken to surgery spinal anesthetic was given patient placed on fracture table Right leg padded placed in well leg holder left leg placed in traction  Left leg internally rotated C-arm brought in fracture was reduced  Gave the okay to prepped and draped sterilely timeout completed  Incision made over the greater trochanter extended distally subtenons tissue divided Fascial split in line with skin incision Vastus lateralis identified incised in line with skin incision Cautery used to coagulate perforating vessels Subperiosteal dissection expose proximal femur Multi pin guide was placed 3 K wires were placed using the C-arm to get them in the femoral head and the appropriate position  Once the screws were in good position each was measured and the cortex was drilled and then 3 screws were placed with washers  Final x-rays confirm adequate reduction and pin placement and screw placement  Wound irrigated and closed in layered fashion with Surgilon Monocryl in 2 layers  Staples  I injected a total of 60 mL of Marcaine with epinephrine   PROCEDURE:  Procedure(s): CANNULATED HIP PINNING (Left)  SURGEON:  Surgeon(s) and Role:    * Vickki HearingStanley E Loida Calamia, MD - Primary  PHYSICIAN ASSISTANT:   ASSISTANTS: none   ANESTHESIA:   none  EBL:  Total I/O In: 400 [I.V.:400] Out: 120 [Urine:100; Blood:20]  BLOOD ADMINISTERED:none  DRAINS: none   LOCAL MEDICATIONS USED:  MARCAINE     SPECIMEN:  No Specimen  DISPOSITION OF SPECIMEN:   N/A  COUNTS:  YES  TOURNIQUET:  * No tourniquets in log *  DICTATION: .Dragon Dictation  PLAN OF CARE: Admit to inpatient   PATIENT DISPOSITION:  PACU - hemodynamically stable.   Delay start of Pharmacological VTE agent (>24hrs) due to surgical blood loss or risk of bleeding: yes  [id number]

## 2016-12-07 NOTE — Anesthesia Procedure Notes (Signed)
Spinal  Patient location during procedure: OR Start time: 12/07/2016 10:53 AM Staffing Resident/CRNA: ADAMS, AMY A Preanesthetic Checklist Completed: patient identified, site marked, surgical consent, pre-op evaluation, timeout performed, IV checked, risks and benefits discussed and monitors and equipment checked Spinal Block Patient position: left lateral decubitus Prep: Betadine Patient monitoring: heart rate, cardiac monitor, continuous pulse ox and blood pressure Approach: left paramedian Location: L3-4 Injection technique: single-shot Needle Needle type: Spinocan  Needle gauge: 22 G Needle length: 9 cm Assessment Sensory level: T8 Additional Notes  ATTEMPTS:1 TRAY UJ:[phone] TRAY EXPIRATION DATE:11-29-2017

## 2016-12-07 NOTE — Anesthesia Preprocedure Evaluation (Signed)
Anesthesia Evaluation  Patient identified by MRN, date of birth, ID band Patient confused    Reviewed: Allergy & Precautions, NPO status , Patient's Chart, lab work & pertinent test results, reviewed documented beta blocker date and time , Unable to perform ROS - Chart review only  Airway Mallampati: III  TM Distance: >3 FB Neck ROM: Full    Dental  (+) Poor Dentition, Missing, Chipped, Dental Advisory Given   Pulmonary neg pulmonary ROS, former smoker,    Pulmonary exam normal breath sounds clear to auscultation       Cardiovascular hypertension, Pt. on medications and Pt. on home beta blockers (-) anginaNormal cardiovascular exam Rhythm:Regular Rate:Normal  '11 ECHO: EF 60-65%   Neuro/Psych  Headaches, PSYCHIATRIC DISORDERS (Alzheimer's) Anxiety Alzheimer's Disease negative psych ROS   GI/Hepatic negative GI ROS, Neg liver ROS,   Endo/Other  negative endocrine ROSdiabetes, Poorly Controlled, Type 2, Oral Hypoglycemic AgentsHypothyroidism   Renal/GU   negative genitourinary   Musculoskeletal negative musculoskeletal ROS (+)   Abdominal   Peds  Hematology  (+) Blood dyscrasia (Hb 11.5), anemia ,   Anesthesia Other Findings Pt has dementia, hx taken from EMR. Family not present.  Reproductive/Obstetrics negative OB ROS                             Anesthesia Physical Anesthesia Plan  ASA: III  Anesthesia Plan: Spinal   Post-op Pain Management:    Induction:   Airway Management Planned: Simple Face Mask  Additional Equipment:   Intra-op Plan:   Post-operative Plan:   Informed Consent: I have reviewed the patients History and Physical, chart, labs and discussed the procedure including the risks, benefits and alternatives for the proposed anesthesia with the patient or authorized representative who has indicated his/her understanding and acceptance.     Plan Discussed with:    Anesthesia Plan Comments:         Anesthesia Quick Evaluation

## 2016-12-08 DIAGNOSIS — S72002S Fracture of unspecified part of neck of left femur, sequela: Secondary | ICD-10-CM

## 2016-12-08 DIAGNOSIS — F0390 Unspecified dementia without behavioral disturbance: Secondary | ICD-10-CM

## 2016-12-08 LAB — GLUCOSE, CAPILLARY
Glucose-Capillary: 167 mg/dL — ABNORMAL HIGH (ref 65–99)
Glucose-Capillary: 170 mg/dL — ABNORMAL HIGH (ref 65–99)
Glucose-Capillary: 186 mg/dL — ABNORMAL HIGH (ref 65–99)
Glucose-Capillary: 188 mg/dL — ABNORMAL HIGH (ref 65–99)
Glucose-Capillary: 194 mg/dL — ABNORMAL HIGH (ref 65–99)
Glucose-Capillary: 208 mg/dL — ABNORMAL HIGH (ref 65–99)
Glucose-Capillary: 224 mg/dL — ABNORMAL HIGH (ref 65–99)

## 2016-12-08 LAB — BASIC METABOLIC PANEL
ANION GAP: 11 (ref 5–15)
BUN: 11 mg/dL (ref 6–20)
CHLORIDE: 102 mmol/L (ref 101–111)
CO2: 22 mmol/L (ref 22–32)
Calcium: 9.2 mg/dL (ref 8.9–10.3)
Creatinine, Ser: 0.56 mg/dL (ref 0.44–1.00)
GFR calc Af Amer: >60 mL/min (ref >60)
Glucose, Bld: 199 mg/dL — ABNORMAL HIGH (ref 65–99)
POTASSIUM: 3.6 mmol/L (ref 3.5–5.1)
SODIUM: 135 mmol/L (ref 135–145)

## 2016-12-08 LAB — CBC
HCT: 38.9 % (ref 36.0–46.0)
HEMOGLOBIN: 13.2 g/dL (ref 12.0–15.0)
MCH: 29.7 pg (ref 26.0–34.0)
MCHC: 33.9 g/dL (ref 30.0–36.0)
MCV: 87.4 fL (ref 78.0–100.0)
PLATELETS: 133 10*3/uL — AB (ref 150–400)
RBC: 4.45 MIL/uL (ref 3.87–5.11)
RDW: 13.2 % (ref 11.5–15.5)
WBC: 9.4 10*3/uL (ref 4.0–10.5)

## 2016-12-08 LAB — URINE CULTURE: Culture: <10000 — AB

## 2016-12-08 NOTE — Addendum Note (Signed)
Addendum  created 12/08/16 2024 by Earleen NewportAmy A Adams, CRNA   Sign clinical note

## 2016-12-08 NOTE — Progress Notes (Signed)
PROGRESS NOTE    [name]  ZOX:[id number] DOB: 07/29/1935 DOA: 12/06/2016 PCP: Galvin ProfferHAGUE, IMRAN P, MD   Brief Narrative:  81 y.o. female with hx of dementia, SNF resident with DNR code status, hx of HTN, anxiety, HLD, DM, seizure on dilantin,  hypothyrodism, hx of ICH, fell and has left hip pain, presented to the ER.  X ray of the hip showed valgus left subcapital Fx with mild impaction   Assessment & Plan:   Principal Problem:   Closed fracture of neck of left femur (HCC) - Orthopedic surgery consulted and assisting  - Continue supportive therapy  Active Problems:   Hypothyroidism - Continue Synthroid    Essential hypertension - Stable on lisinopril, metoprolol, and amlodipine    Type 2 diabetes mellitus with hyperglycemia (HCC) - diabetic diet.  - sliding scale insulin    Dementia - Stable continue home medication regimen    Partial symptomatic epilepsy with complex partial seizures, not intractable, without status epilepticus (HCC)   - stable continue Dilantin. No seizure-like activity reported    DVT prophylaxis: (As per orthopedic surgeon recommendations Code Status:  dnr Family Communication: none at bedside Disposition Plan: recommendations per PT   Consultants:   ortho   Procedures: please refer to ortho notes   Antimicrobials: clindamycin   Subjective: Pt has no new complaints.  Objective: Vitals:   12/07/16 1408 12/07/16 1508 12/07/16 1538 12/08/16 0800  BP: (!) 114/49 (!) 146/68  (!) 130/54  Pulse: 73 82  74  Resp: 20 20  18   Temp: 98 F (36.7 C) 98.2 F (36.8 C)  99.3 F (37.4 C)  TempSrc: Oral Oral  Oral  SpO2: 96% 100% 94% 95%  Weight:      Height:        Intake/Output Summary (Last 24 hours) at 12/08/16 1224 Last data filed at 12/07/16 1830  Gross per 24 hour  Intake                0 ml  Output              450 ml  Net             -450 ml   Filed Weights   12/06/16 1733  Weight: 59 kg (130 lb 1.1 oz)     Examination:  General exam: Appears calm and comfortable  Respiratory system: Clear to auscultation. Respiratory effort normal. Cardiovascular system: S1 & S2 heard, RRR. No JVD, murmurs, rubs, gallops or clicks. Gastrointestinal system: Abdomen is nondistended, soft and nontender. No organomegaly or masses felt.  Central nervous system: Alert and awake. No focal neurological deficits. Extremities: no clubbing Skin: No rashes, warm and dry on limited exam. Psychiatry:  Mood & affect appropriate.     Data Reviewed: I have personally reviewed following labs and imaging studies  CBC:  Recent Labs Lab 12/06/16 1329 12/07/16 0613 12/08/16 0623  WBC 9.0 6.6 9.4  NEUTROABS 6.0  --   --   HGB 13.8 12.0 13.2  HCT 40.8 36.8 38.9  MCV 88.5 89.1 87.4  PLT 127* 112* 133*   Basic Metabolic Panel:  Recent Labs Lab 12/06/16 1329 12/07/16 0613 12/08/16 0623  NA 139 139 135  K 3.4* 3.5 3.6  CL 106 107 102  CO2 25 26 22   GLUCOSE 128* 131* 199*  BUN 12 15 11   CREATININE 0.65 0.78 0.56  CALCIUM 9.5 9.1 9.2   GFR: Estimated Creatinine Clearance: 51.3 mL/min (by C-G formula based on SCr  of 0.56 mg/dL). Liver Function Tests: No results for input(s): AST, ALT, ALKPHOS, BILITOT, PROT, ALBUMIN in the last 168 hours. No results for input(s): LIPASE, AMYLASE in the last 168 hours. No results for input(s): AMMONIA in the last 168 hours. Coagulation Profile: No results for input(s): INR, PROTIME in the last 168 hours. Cardiac Enzymes: No results for input(s): CKTOTAL, CKMB, CKMBINDEX, TROPONINI in the last 168 hours. BNP (last 3 results) No results for input(s): PROBNP in the last 8760 hours. HbA1C: No results for input(s): HGBA1C in the last 72 hours. CBG:  Recent Labs Lab 12/07/16 2041 12/08/16 0152 12/08/16 0623 12/08/16 0747 12/08/16 1113  GLUCAP 205* 224* 186* 208* 194*   Lipid Profile: No results for input(s): CHOL, HDL, LDLCALC, TRIG, CHOLHDL, LDLDIRECT in the  last 72 hours. Thyroid Function Tests: No results for input(s): TSH, T4TOTAL, FREET4, T3FREE, THYROIDAB in the last 72 hours. Anemia Panel: No results for input(s): VITAMINB12, FOLATE, FERRITIN, TIBC, IRON, RETICCTPCT in the last 72 hours. Sepsis Labs: No results for input(s): PROCALCITON, LATICACIDVEN in the last 168 hours.  Recent Results (from the past 240 hour(s))  Urine culture     Status: Abnormal   Collection Time: 12/06/16  4:42 PM  Result Value Ref Range Status   Specimen Description URINE, CATHETERIZED  Final   Special Requests NONE  Final   Culture (A)  Final    <10,000 COLONIES/mL INSIGNIFICANT GROWTH Performed at [facility]    Report Status 12/08/2016 FINAL  Final  Surgical pcr screen     Status: None   Collection Time: 12/06/16  7:31 PM  Result Value Ref Range Status   MRSA, PCR NEGATIVE NEGATIVE Final   Staphylococcus aureus NEGATIVE NEGATIVE Final    Comment:        The Xpert SA Assay (FDA approved for NASAL specimens in patients over 46 years of age), is one component of a comprehensive surveillance program.  Test performance has been validated by [facility] for patients greater than or equal to 59 year old. It is not intended to diagnose infection nor to guide or monitor treatment.          Radiology Studies: Dg Lumbar Spine Complete  Result Date: 12/06/2016 CLINICAL DATA:  Generalized low back pain following fall on concrete yesterday EXAM: LUMBAR SPINE - COMPLETE 4+ VIEW COMPARISON:  09/22/2016 FINDINGS: Postsurgical changes are again noted at L2-3. Compression deformity at L2 is again noted and stable. No new compression deformity is seen. No hardware failure is noted. Diffuse aortic calcifications are noted. Mild osteophytic changes are seen. IMPRESSION: Stable appearing L2 compression deformity when compared with the prior exam. No new focal abnormality is seen. Electronically Signed   By: Alcide Clever M.D.   On: 12/06/2016 13:39   Dg  Hip Operative Unilat W Or W/o Pelvis Left  Result Date: 12/07/2016 CLINICAL DATA:  ORIF left hip. EXAM: OPERATIVE LEFT HIP (WITH PELVIS IF PERFORMED) TECHNIQUE: Fluoroscopic spot image(s) were submitted for interpretation post-operatively. COMPARISON:  12/06/2016 . FINDINGS: Patient status post surgical pinning left femoral neck fracture. Hardware intact. IMPRESSION: Postsurgical changes left hip. Electronically Signed   By: Maisie Fus  Register   On: 12/07/2016 12:21   Dg Hip Unilat W Or Wo Pelvis 2-3 Views Left  Result Date: 12/06/2016 CLINICAL DATA:  Left hip pain after fall EXAM: DG HIP (WITH OR WITHOUT PELVIS) 2-3V LEFT COMPARISON:  Pelvic radiograph from 09/22/2016 FINDINGS: There is an acute, closed, valgus angulated subcapital left femoral neck fracture with slight  impaction superolaterally. The bony pelvis appears intact. Right bipolar hip arthroplasty is partially imaged and unremarkable in appearance. There is lower lumbar degenerative disc and facet arthropathy. IMPRESSION: Acute, closed, valgus angulated subcapital left femoral neck fracture with slight impaction. Electronically Signed   By: Tollie Ethavid  Kwon M.D.   On: 12/06/2016 15:23        Scheduled Meds: . amLODipine  10 mg Oral Daily  . aspirin EC  325 mg Oral Q breakfast  . citalopram  20 mg Oral Daily  . donepezil  10 mg Oral Daily  . gabapentin  600 mg Oral TID  . insulin aspart  0-9 Units Subcutaneous Q4H  . levothyroxine  25 mcg Oral QAC breakfast  . lisinopril  40 mg Oral Daily  . LORazepam  0.5 mg Oral BID  . memantine  28 mg Oral Daily  . metoprolol tartrate  25 mg Oral BID  . pantoprazole  40 mg Oral Daily  . phenytoin  300 mg Oral QHS  . polyethylene glycol  17 g Oral Daily  . polyvinyl alcohol  1 drop Both Eyes BID   Continuous Infusions: . dextrose 5 % and 0.9% NaCl 50 mL/hr at 12/06/16 1757     LOS: 2 days    Time spent: > 35 minutes  Penny PiaVEGA, Moise Friday, MD [facility] Pager [phone]  If 7PM-7AM,  please contact night-coverage [email or web address] Password [email or web address] 12/08/2016, 12:24 PM

## 2016-12-08 NOTE — Anesthesia Postprocedure Evaluation (Signed)
Anesthesia Post Note  Patient: [name]  Procedure(s) Performed: Procedure(s) (LRB): CANNULATED HIP PINNING (Left)  Patient location during evaluation: Nursing Unit Anesthesia Type: Spinal Level of consciousness: awake and alert (Patient with dementia) Pain management: pain level controlled (Receiving  morphine for pain) Vital Signs Assessment: post-procedure vital signs reviewed and stable Respiratory status: respiratory function stable and spontaneous breathing Cardiovascular status: stable Postop Assessment: no signs of nausea or vomiting Anesthetic complications: no    Last Vitals:  Vitals:   12/08/16 0800 12/08/16 1215  BP: (!) 130/54 (!) 147/61  Pulse: 74 75  Resp: 18 18  Temp: 37.4 C 37.2 C    Last Pain:  Vitals:   12/08/16 1527  TempSrc:   PainSc: Asleep                 [name] A

## 2016-12-08 NOTE — Progress Notes (Signed)
Patient ID: Don PerkingBeverly J Lloyd, female   DOB: 01/10/1935, 81 y.o.   MRN: [id number] Postoperative 1 status post right hip cannulated screw fixation  BP (!) 130/54 (BP Location: Right Arm)   Pulse 74   Temp 99.3 F (37.4 C) (Oral)   Resp 18   Ht 5\' 5"  (1.651 m)   Wt 130 lb 1.1 oz (59 kg)   SpO2 95%   BMI 21.64 kg/m   She is in stable condition alignment is normal her neurovascular exam is normal  She can be up out of bed weightbearing as tolerated  We'll continue to follow until discharge

## 2016-12-08 NOTE — Evaluation (Signed)
Physical Therapy Evaluation Patient Details Name: [name] Hearns MRN: [id number] DOB: 02/04/1935 Today's Date: 12/08/2016   History of Present Illness  [name] Veley is an 81 y.o. female with hx of dementia, SNF resident with DNR code status, hx of HTN, anxiety, HLD, DM, seizure on dilantin,  hypothyrodism, hx of ICH, fell and has left hip pain, presented to the ER.[name].  She had a fx with an ORIF on 12/07/2016  Clinical Impression  PT will not respond to therapist commands.  All exercises done passively with supine to sit being total assist.  Pt is from a SNF recommendation is to return to SNF.    Follow Up Recommendations SNF    Equipment Recommendations  None recommended by PT       Precautions / Restrictions Precautions Precautions: Fall Restrictions Weight Bearing Restrictions: No      Mobility  Bed Mobility Overal bed mobility: Needs Assistance Bed Mobility: Rolling;Supine to Sit Rolling: Total assist   Supine to sit: Total assist        Transfers                 General transfer comment: unable to assist at this time   Ambulation/Gait                Stairs            Wheelchair Mobility    Modified Rankin (Stroke Patients Only)       Balance                                             Pertinent Vitals/Pain Pain Assessment:  (Pain faces noted only with mobilization of Lt LE) =8/10    Home Living Family/patient expects to be discharged to:: Skilled nursing facility                      Prior Function Level of Independence: Needs assistance (Pt is a resident of SNF; pt is demented and no family is present .  Previous ambulatory status is unknown at this time. )               Extremity/Trunk ssessment               Lower Extremity Assessment: Difficult to assess due to impaired cognition         Communication   Communication:  (Pt verbalizes but does not respond appropriately)   Cognition Arousal/Alertness: Lethargic Behavior During Therapy: Agitated Overall Cognitive Status: History of cognitive impairments - at baseline                      General Comments      Exercises General Exercises - Lower Extremity Ankle Circles/Pumps: PROM;10 reps Heel Slides: 5 reps;PROM   Assessment/Plan    PT Assessment Patient needs continued PT services  PT Problem List Decreased strength;Decreased activity tolerance;Decreased mobility;Decreased knowledge of use of DME;Pain          PT Treatment Interventions Gait training;Therapeutic activities;Therapeutic exercise;Balance training    PT Goals (Current goals can be found in the Care Plan section)  Acute Rehab PT Goals PT Goal Formulation: Patient unable to participate in goal setting Time For Goal Achievement: 12/12/16 Potential to Achieve Goals: Poor    Frequency Min 5X/week   Barriers to discharge  Co-evaluation               End of Session   Activity Tolerance: Patient limited by pain;Patient limited by lethargy Patient left: in bed;with call bell/phone within reach           Time: 1015-1040 PT Time Calculation (min) (ACUTE ONLY): 25 min   Charges:   PT Evaluation $PT Eval Moderate Complexity: 1 Procedure     PT G CodesVirgina Organ:        Rhyanna Sorce, PT CLT [phone] 12/08/2016, 10:43 AM

## 2016-12-09 DIAGNOSIS — S72009S Fracture of unspecified part of neck of unspecified femur, sequela: Secondary | ICD-10-CM

## 2016-12-09 LAB — GLUCOSE, CAPILLARY
GLUCOSE-CAPILLARY: 159 mg/dL — AB (ref 65–99)
GLUCOSE-CAPILLARY: 126 mg/dL — AB (ref 65–99)
GLUCOSE-CAPILLARY: 163 mg/dL — AB (ref 65–99)
Glucose-Capillary: 155 mg/dL — ABNORMAL HIGH (ref 65–99)
Glucose-Capillary: 161 mg/dL — ABNORMAL HIGH (ref 65–99)
Glucose-Capillary: 182 mg/dL — ABNORMAL HIGH (ref 65–99)

## 2016-12-09 LAB — CBC
HCT: 34.6 % — ABNORMAL LOW (ref 36.0–46.0)
Hemoglobin: 11.7 g/dL — ABNORMAL LOW (ref 12.0–15.0)
MCH: 29.8 pg (ref 26.0–34.0)
MCHC: 33.8 g/dL (ref 30.0–36.0)
MCV: 88 fL (ref 78.0–100.0)
PLATELETS: 125 10*3/uL — AB (ref 150–400)
RBC: 3.93 MIL/uL (ref 3.87–5.11)
RDW: 13.2 % (ref 11.5–15.5)
WBC: 7.4 10*3/uL (ref 4.0–10.5)

## 2016-12-09 LAB — BASIC METABOLIC PANEL
ANION GAP: 6 (ref 5–15)
BUN: 10 mg/dL (ref 6–20)
CALCIUM: 8.9 mg/dL (ref 8.9–10.3)
CO2: 25 mmol/L (ref 22–32)
Chloride: 106 mmol/L (ref 101–111)
Creatinine, Ser: 0.63 mg/dL (ref 0.44–1.00)
Glucose, Bld: 186 mg/dL — ABNORMAL HIGH (ref 65–99)
POTASSIUM: 3.2 mmol/L — AB (ref 3.5–5.1)
SODIUM: 137 mmol/L (ref 135–145)

## 2016-12-09 MED ORDER — POTASSIUM CHLORIDE CRYS ER 20 MEQ PO TBCR
40.0000 meq | EXTENDED_RELEASE_TABLET | Freq: Once | ORAL | Status: AC
  Administered 2016-12-09: 40 meq via ORAL
  Filled 2016-12-09: qty 2

## 2016-12-09 NOTE — Progress Notes (Signed)
PROGRESS NOTE    Don PerkingBeverly J Tornow  ZOX:[id number] DOB: 11/01/1935 DOA: 12/06/2016 PCP: Galvin ProfferHAGUE, IMRAN P, MD   Brief Narrative:  81 y.o. female with hx of dementia, SNF resident with DNR code status, hx of HTN, anxiety, HLD, DM, seizure on dilantin,  hypothyrodism, hx of ICH, fell and has left hip pain, presented to the ER.  X ray of the hip showed valgus left subcapital Fx with mild impaction   Assessment & Plan:   Principal Problem:   Closed fracture of neck of left femur (HCC) - Orthopedic surgery consulted and assisting  - Continue supportive therapy  Active Problems:   Hypothyroidism - Continue Synthroid  Hypokalemia - new problem will replace orally    Essential hypertension - Stable on lisinopril, metoprolol, and amlodipine    Type 2 diabetes mellitus with hyperglycemia (HCC) - diabetic diet.  - sliding scale insulin    Dementia - Stable continue home medication regimen    Partial symptomatic epilepsy with complex partial seizures, not intractable, without status epilepticus (HCC)   - stable continue Dilantin. No seizure-like activity reported    DVT prophylaxis: As per orthopedic surgeon recommendations Code Status:  dnr Family Communication: none at bedside Disposition Plan: recommendations per PT   Consultants:   ortho   Procedures: please refer to ortho notes   Antimicrobials: clindamycin   Subjective: Pt has no new complaints.  Objective: Vitals:   12/08/16 0800 12/08/16 1215 12/08/16 2110 12/09/16 0300  BP: (!) 130/54 (!) 147/61 (!) 169/76 (!) 185/72  Pulse: 74 75 84 79  Resp: 18 18 16 18   Temp: 99.3 F (37.4 C) 98.9 F (37.2 C) 99.2 F (37.3 C) 98.8 F (37.1 C)  TempSrc: Oral Oral Oral Oral  SpO2: 95% 97% 96% 99%  Weight:      Height:        Intake/Output Summary (Last 24 hours) at 12/09/16 1341 Last data filed at 12/09/16 0900  Gross per 24 hour  Intake             1120 ml  Output                1 ml  Net             1119 ml     Filed Weights   12/06/16 1733  Weight: 59 kg (130 lb 1.1 oz)    Examination:  General exam: Appears calm and comfortable  Respiratory system: Clear to auscultation. Respiratory effort normal. Cardiovascular system: S1 & S2 heard, RRR. No JVD, murmurs, rubs, gallops or clicks. Gastrointestinal system: Abdomen is nondistended, soft and nontender. No organomegaly or masses felt.  Central nervous system: Alert and awake. No focal neurological deficits. Extremities: no clubbing Skin: No rashes, warm and dry on limited exam. Psychiatry:  Mood & affect appropriate.     Data Reviewed: I have personally reviewed following labs and imaging studies  CBC:  Recent Labs Lab 12/06/16 1329 12/07/16 0613 12/08/16 0623 12/09/16 0555  WBC 9.0 6.6 9.4 7.4  NEUTROABS 6.0  --   --   --   HGB 13.8 12.0 13.2 11.7*  HCT 40.8 36.8 38.9 34.6*  MCV 88.5 89.1 87.4 88.0  PLT 127* 112* 133* 125*   Basic Metabolic Panel:  Recent Labs Lab 12/06/16 1329 12/07/16 0613 12/08/16 0623 12/09/16 0555  NA 139 139 135 137  K 3.4* 3.5 3.6 3.2*  CL 106 107 102 106  CO2 25 26 22 25   GLUCOSE 128* 131* 199* 186*  BUN 12 15 11 10   CREATININE 0.65 0.78 0.56 0.63  CALCIUM 9.5 9.1 9.2 8.9   GFR: Estimated Creatinine Clearance: 51.3 mL/min (by C-G formula based on SCr of 0.63 mg/dL). Liver Function Tests: No results for input(s): AST, ALT, ALKPHOS, BILITOT, PROT, ALBUMIN in the last 168 hours. No results for input(s): LIPASE, AMYLASE in the last 168 hours. No results for input(s): AMMONIA in the last 168 hours. Coagulation Profile: No results for input(s): INR, PROTIME in the last 168 hours. Cardiac Enzymes: No results for input(s): CKTOTAL, CKMB, CKMBINDEX, TROPONINI in the last 168 hours. BNP (last 3 results) No results for input(s): PROBNP in the last 8760 hours. HbA1C: No results for input(s): HGBA1C in the last 72 hours. CBG:  Recent Labs Lab 12/08/16 1959 12/09/16 0002 12/09/16 0447  12/09/16 0723 12/09/16 1119  GLUCAP 167* 170* 159* 161* 126*   Lipid Profile: No results for input(s): CHOL, HDL, LDLCALC, TRIG, CHOLHDL, LDLDIRECT in the last 72 hours. Thyroid Function Tests: No results for input(s): TSH, T4TOTAL, FREET4, T3FREE, THYROIDAB in the last 72 hours. Anemia Panel: No results for input(s): VITAMINB12, FOLATE, FERRITIN, TIBC, IRON, RETICCTPCT in the last 72 hours. Sepsis Labs: No results for input(s): PROCALCITON, LATICACIDVEN in the last 168 hours.  Recent Results (from the past 240 hour(s))  Urine culture     Status: Abnormal   Collection Time: 12/06/16  4:42 PM  Result Value Ref Range Status   Specimen Description URINE, CATHETERIZED  Final   Special Requests NONE  Final   Culture (A)  Final    <10,000 COLONIES/mL INSIGNIFICANT GROWTH Performed at [facility]    Report Status 12/08/2016 FINAL  Final  Surgical pcr screen     Status: None   Collection Time: 12/06/16  7:31 PM  Result Value Ref Range Status   MRSA, PCR NEGATIVE NEGATIVE Final   Staphylococcus aureus NEGATIVE NEGATIVE Final    Comment:        The Xpert SA Assay (FDA approved for NASAL specimens in patients over 79 years of age), is one component of a comprehensive surveillance program.  Test performance has been validated by [facility] for patients greater than or equal to 79 year old. It is not intended to diagnose infection nor to guide or monitor treatment.          Radiology Studies: No results found.      Scheduled Meds: . amLODipine  10 mg Oral Daily  . aspirin EC  325 mg Oral Q breakfast  . citalopram  20 mg Oral Daily  . donepezil  10 mg Oral Daily  . gabapentin  600 mg Oral TID  . insulin aspart  0-9 Units Subcutaneous Q4H  . levothyroxine  25 mcg Oral QAC breakfast  . lisinopril  40 mg Oral Daily  . LORazepam  0.5 mg Oral BID  . memantine  28 mg Oral Daily  . metoprolol tartrate  25 mg Oral BID  . pantoprazole  40 mg Oral Daily  .  phenytoin  300 mg Oral QHS  . polyethylene glycol  17 g Oral Daily  . polyvinyl alcohol  1 drop Both Eyes BID   Continuous Infusions: . dextrose 5 % and 0.9% NaCl 50 mL/hr at 12/09/16 0739     LOS: 3 days    Time spent: > 35 minutes  Penny PiaVEGA, Kelii Chittum, MD [facility] Pager [phone]  If 7PM-7AM, please contact night-coverage [email or web address] Password [email or web address] 12/09/2016, 1:41 PM

## 2016-12-10 ENCOUNTER — Encounter (HOSPITAL_COMMUNITY): Payer: Self-pay | Admitting: Orthopedic Surgery

## 2016-12-10 LAB — CBC
HEMATOCRIT: 33.6 % — AB (ref 36.0–46.0)
HEMOGLOBIN: 11.1 g/dL — AB (ref 12.0–15.0)
MCH: 29 pg (ref 26.0–34.0)
MCHC: 33 g/dL (ref 30.0–36.0)
MCV: 87.7 fL (ref 78.0–100.0)
Platelets: 121 10*3/uL — ABNORMAL LOW (ref 150–400)
RBC: 3.83 MIL/uL — AB (ref 3.87–5.11)
RDW: 12.8 % (ref 11.5–15.5)
WBC: 7.3 10*3/uL (ref 4.0–10.5)

## 2016-12-10 LAB — GLUCOSE, CAPILLARY
Glucose-Capillary: 135 mg/dL — ABNORMAL HIGH (ref 65–99)
Glucose-Capillary: 147 mg/dL — ABNORMAL HIGH (ref 65–99)
Glucose-Capillary: 162 mg/dL — ABNORMAL HIGH (ref 65–99)

## 2016-12-10 LAB — TYPE AND SCREEN
BLOOD PRODUCT EXPIRATION DATE: 201712082359
BLOOD PRODUCT EXPIRATION DATE: 201712272359
Blood Product Expiration Date: 201712272359
ISSUE DATE / TIME: 201712080104
UNIT TYPE AND RH: 5100
Unit Type and Rh: 5100
Unit Type and Rh: 9500

## 2016-12-10 MED ORDER — AMLODIPINE BESYLATE 10 MG PO TABS: 10.0000 mg | ORAL_TABLET | Freq: Every day | ORAL | Status: AC

## 2016-12-10 MED ORDER — LORAZEPAM 2 MG/ML PO CONC: 0.5000 mg | Freq: Two times a day (BID) | ORAL | 0 refills | Status: AC

## 2016-12-10 MED ORDER — ASPIRIN 325 MG PO TBEC: 325.0000 mg | DELAYED_RELEASE_TABLET | Freq: Every day | ORAL | 0 refills | Status: AC

## 2016-12-10 NOTE — Care Management Important Message (Signed)
Important Message  Patient Details  Name: [name] MRN: [id number] Date of Birth: 06/09/1935   Medicare Important Message Given:  Yes    Malcolm MetroChildress, Xeng Kucher Demske, RN 12/10/2016, 11:46 AM

## 2016-12-10 NOTE — NC FL2 (Signed)
[place] MEDICAID FL2 LEVEL OF CARE SCREENING TOOL     IDENTIFICATION  Patient Name: Tara PerkingBeverly J Rhinesmith Birthdate: 10/04/1935 Sex: female Admission Date (Current Location): 12/06/2016  [facility] [place]   Facility and Address:  [facility],  [address], [place] [id number]      Provider Number: [id number]  Attending Physician Name and Address:  Penny Piarlando Vega, MD  Relative Name and Phone Number:       Current Level of Care: Hospital Recommended Level of Care: Skilled Nursing Facility Prior Approval Number:    Date Approved/Denied:   PASRR Number: [id number]  Discharge Plan: SNF    Current Diagnoses: Patient Active Problem List   Diagnosis Date Noted  . Closed fracture of neck of left femur (HCC)   . Closed fracture of left hip (HCC) 12/06/2016  . Right hip pain   . Palliative care encounter   . Encounter for hospice care discussion   . Partial symptomatic epilepsy with complex partial seizures, not intractable, without status epilepticus (HCC)   . Altered mental status 07/29/2016  . Hypokalemia 07/29/2016  . Displaced fracture of right femoral neck (HCC) 05/07/2016  . Hip fracture (HCC) 05/04/2016  . Closed right hip fracture (HCC) 05/04/2016  . Type 2 diabetes mellitus with hyperglycemia (HCC) 05/04/2016  . Dementia 05/04/2016  . Closed fracture of part of upper end of humerus 01/03/2015  . Hypothyroidism 12/19/2010  . DIABETES MELLITUS, TYPE II 12/19/2010  . DEMENTIA 12/19/2010  . ANXIETY 12/19/2010  . Essential hypertension 12/19/2010  . DIZZINESS 12/19/2010  . FATIGUE 12/19/2010  . HEADACHE 12/19/2010    Orientation RESPIRATION BLADDER Height & Weight     Self, Place  Normal Incontinent Weight: 130 lb 1.1 oz (59 kg) Height:  5\' 5"  (165.1 cm)  BEHAVIORAL SYMPTOMS/MOOD NEUROLOGICAL BOWEL NUTRITION STATUS  Other (Comment) (none) Convulsions/Seizures (history) Incontinent Diet (Carb modified)  AMBULATORY STATUS  COMMUNICATION OF NEEDS Skin   Total Care Verbally Surgical wounds                       Personal Care Assistance Level of Assistance  Bathing, Feeding, Dressing Bathing Assistance: Maximum assistance Feeding assistance: Maximum assistance Dressing Assistance: Maximum assistance     Functional Limitations Info  Sight, Hearing, Speech Sight Info: Adequate Hearing Info: Adequate Speech Info: Adequate    SPECIAL CARE FACTORS FREQUENCY  PT (By licensed PT)     PT Frequency: daily              Contractures      Additional Factors Info  Insulin Sliding Scale Code Status Info: DNR Allergies Info: Penicillins, Promethazine Hcl, Sulfonamide Derivatives           Current Medications (12/10/2016):  This is the current hospital active medication list Current Facility-Administered Medications  Medication Dose Route Frequency Provider Last Rate Last Dose  . acetaminophen (TYLENOL) tablet 650 mg  650 mg Oral Q6H PRN Vickki HearingStanley E Harrison, MD       Or  . acetaminophen (TYLENOL) suppository 650 mg  650 mg Rectal Q6H PRN Vickki HearingStanley E Harrison, MD      . amLODipine (NORVASC) tablet 10 mg  10 mg Oral Daily Houston SirenPeter Le, MD   10 mg at 12/10/16 1019  . aspirin EC tablet 325 mg  325 mg Oral Q breakfast Vickki HearingStanley E Harrison, MD   325 mg at 12/10/16 [id number]  . citalopram (CELEXA) tablet 20 mg  20 mg Oral Daily Houston SirenPeter Le,  MD   20 mg at 12/10/16 1025  . dextrose 5 %-0.9 % sodium chloride infusion   Intravenous Continuous Houston SirenPeter Le, MD 50 mL/hr at 12/10/16 0359    . donepezil (ARICEPT) tablet 10 mg  10 mg Oral Daily Houston SirenPeter Le, MD   10 mg at 12/10/16 1025  . gabapentin (NEURONTIN) capsule 600 mg  600 mg Oral TID Houston SirenPeter Le, MD   600 mg at 12/10/16 [id number]  . insulin aspart (novoLOG) injection 0-9 Units  0-9 Units Subcutaneous Q4H Houston SirenPeter Le, MD   2 Units at 12/10/16 [phone]  . levothyroxine (SYNTHROID, LEVOTHROID) tablet 25 mcg  25 mcg Oral QAC breakfast Houston SirenPeter Le, MD   25 mcg at 12/10/16 [id number]  . lisinopril  (PRINIVIL,ZESTRIL) tablet 40 mg  40 mg Oral Daily Houston SirenPeter Le, MD   40 mg at 12/10/16 1018  . LORazepam (ATIVAN) tablet 0.5 mg  0.5 mg Oral BID Houston SirenPeter Le, MD   0.5 mg at 12/10/16 1019  . memantine (NAMENDA XR) 24 hr capsule 28 mg  28 mg Oral Daily Houston SirenPeter Le, MD   28 mg at 12/10/16 1025  . menthol-cetylpyridinium (CEPACOL) lozenge 3 mg  1 lozenge Oral PRN Vickki HearingStanley E Harrison, MD       Or  . phenol (CHLORASEPTIC) mouth spray 1 spray  1 spray Mouth/Throat PRN Vickki HearingStanley E Harrison, MD      . metoCLOPramide (REGLAN) tablet 5-10 mg  5-10 mg Oral Q8H PRN Vickki HearingStanley E Harrison, MD       Or  . metoCLOPramide (REGLAN) injection 5-10 mg  5-10 mg Intravenous Q8H PRN Vickki HearingStanley E Harrison, MD      . metoprolol tartrate (LOPRESSOR) tablet 25 mg  25 mg Oral BID Houston SirenPeter Le, MD   25 mg at 12/10/16 1019  . morphine 2 MG/ML injection 0.5 mg  0.5 mg Intravenous Q2H PRN Vickki HearingStanley E Harrison, MD   0.5 mg at 12/10/16 0825  . ondansetron (ZOFRAN) tablet 4 mg  4 mg Oral Q6H PRN Houston SirenPeter Le, MD       Or  . ondansetron [facility](ZOFRAN) injection 4 mg  4 mg Intravenous Q6H PRN Houston SirenPeter Le, MD   4 mg at 12/09/16 2303  . pantoprazole (PROTONIX) EC tablet 40 mg  40 mg Oral Daily Houston SirenPeter Le, MD   40 mg at 12/10/16 1019  . phenytoin (DILANTIN) ER capsule 300 mg  300 mg Oral QHS Houston SirenPeter Le, MD   300 mg at 12/09/16 2255  . polyethylene glycol (MIRALAX / GLYCOLAX) packet 17 g  17 g Oral Daily Houston SirenPeter Le, MD   17 g at 12/10/16 1018  . polyvinyl alcohol (LIQUIFILM TEARS) 1.4 % ophthalmic solution 1 drop  1 drop Both Eyes BID Houston SirenPeter Le, MD   1 drop at 12/10/16 1018     Discharge Medications: Please see discharge summary for a list of discharge medications.  Relevant Imaging Results:  Relevant Lab Results:   Additional Information SSN: [id number]  Karn CassisStultz, Isam Unrein [place], [place] [phone]

## 2016-12-10 NOTE — Progress Notes (Signed)
OT Cancellation Note  Patient Details Name: [name] MRN: [id number] DOB: 06/30/1935   Cancelled Treatment:    Reason Eval/Treat Not Completed: Pain limiting ability to participate;Fatigue/lethargy limiting ability to participate. Pt hunched over to the left side while sitting up in bed. Breakfast tray partially eaten. Patient very lethargic. Complained of increased pain with any touch or movement to left hip. Nursing reports that patient has already received morphin for pain this morning. Patient was repositioned in bed to decrease amount of weight onto left side. Patient was not able to participate in OT evaluation at this time. Plan is for patient to D/C today to SNF. Will defer OT evaluation to staff OT at new facility. Thank you for the referral.   Limmie PatriciaLaura Kayshawn Ozburn, OTR/L,CBIS  [phone]  12/10/2016, 9:22 AM

## 2016-12-10 NOTE — Care Management Note (Signed)
Case Management Note  Patient Details  Name: Tara PerkingBeverly J Banales MRN: [id number] Date of Birth: 06/08/1935  Subjective/Objective:                  Pt admitted with hip fx. Pt going to SNF at [place]. CSW is aware and making arrangements.   Action/Plan: Pt discharging to SNF today. No CM needs.   Expected Discharge Date:  12/09/16               Expected Discharge Plan:  Skilled Nursing Facility  In-House Referral:  Clinical Social Work  Discharge planning Services  CM Consult  Post Acute Care Choice:  NA Choice offered to:  NA  Status of Service:  Completed, signed off  Malcolm MetroChildress, Sadiel Mota Demske, RN 12/10/2016, 11:47 AM

## 2016-12-10 NOTE — Clinical Social Work Placement (Addendum)
[facility]'s [facility]. Facility can accept.   CLINICAL SOCIAL WORK PLACEMENT  NOTE  Date:  12/10/2016  Patient Details  Name: [name] MRN: [id number] Date of Birth: 07/03/1935  Clinical Social Work is seeking post-discharge placement for this patient at the Skilled  Nursing Facility level of care (*CSW will initial, date and re-position this form in  chart as items are completed):  Yes   Patient/family provided with [place] Clinical Social Work Department's list of facilities offering this level of care within the geographic area requested by the patient (or if unable, by the patient's family).  Yes   Patient/family informed of their freedom to choose among providers that offer the needed level of care, that participate in Medicare, Medicaid or managed care program needed by the patient, have an available bed and are willing to accept the patient.  Yes   Patient/family informed of 's ownership interest in [facility] and [facility], as well as of the fact that they are under no obligation to receive care at these facilities.  PASRR submitted to EDS on       PASRR number received on       Existing PASRR number confirmed on 12/07/16     FL2 transmitted to all facilities in geographic area requested by pt/family on 12/07/16     FL2 transmitted to all facilities within larger geographic area on       Patient informed that his/her managed care company has contracts with or will negotiate with certain facilities, including the following:        Yes   Patient/family informed of bed offers received.  [facility]     Physician recommends and patient chooses bed at      [facility].  [facility]     Patient family notified on 12/10/16 of transfer.  Name of family member notified:  Tara RuizJohn- husband    Voicemail also left for daughter.   PHYSICIAN       Additional Comment:    _______________________________________________ Karn CassisStultz, Amario Longmore [place], LCSW 12/10/2016, 11:19 AM [phone]

## 2016-12-10 NOTE — Progress Notes (Signed)
[facility],report called and given to Cleora FleetNancy Williams LPN. Transported  Via EMS to awaiting facility.

## 2016-12-10 NOTE — Discharge Summary (Signed)
Physician Discharge Summary  [name] ZOX:[id number] DOB: 1935-08-14 DOA: 12/06/2016  PCP: Galvin Proffer, MD  Admit date: 12/06/2016 Discharge date: 12/10/2016  Time spent: > 35 minutes  Recommendations for Outpatient Follow-up:  1. Please ensure patient follows up with orthopedic surgeon 2. Reassess K levels   Discharge Diagnoses:  Principal Problem:   Hip fracture (HCC) Active Problems:   Hypothyroidism   Essential hypertension   Type 2 diabetes mellitus with hyperglycemia (HCC)   Dementia   Partial symptomatic epilepsy with complex partial seizures, not intractable, without status epilepticus (HCC)   Closed fracture of left hip (HCC)   Closed fracture of neck of left femur (HCC)   Discharge Condition: Stable  Diet recommendation: Carb modified diet  Filed Weights   12/06/16 1733  Weight: 59 kg (130 lb 1.1 oz)    History of present illness:  81 y.o. female with hx of dementia, SNF resident with DNR code status, hx of HTN, anxiety, HLD, DM, seizure on dilantin,  hypothyrodism, hx of ICH, fell and has left hip pain, presented to the ER.  X ray of the hip showed valgus left subcapital Fx with mild impaction.  Hospital Course:  Principal Problem:   Closed fracture of neck of left femur [facility]) - Orthopedic surgery consulted and managed in house - PT at SNF  Active Problems:   Hypothyroidism - Continue Synthroid  Hypokalemia - replaced    Essential hypertension - Stable on lisinopril, metoprolol, and amlodipine    Type 2 diabetes mellitus with hyperglycemia (HCC) - diabetic diet.  - metformin on d/c    Dementia - Stable continue home medication regimen    Partial symptomatic epilepsy with complex partial seizures, not intractable, without status epilepticus (HCC)   - stable continue Dilantin. No seizure-like activity reported   Procedures: status post right hip cannulated screw fixation  Consultations:  Orthopedic surgery: Vickki Hearing, MD  Discharge Exam: Vitals:   12/10/16 0507 12/10/16 1015  BP: (!) 150/52 (!) 151/51  Pulse: 72 71  Resp: 18   Temp: 98.4 F (36.9 C)     General: Pt in nad, alert and awake Cardiovascular: rrr, no rubs Respiratory: no increased wob, no wheezes  Discharge Instructions   Discharge Instructions    Call MD for:  difficulty breathing, headache or visual disturbances    Complete by:  As directed    Call MD for:  temperature >100.4    Complete by:  As directed    Diet - low sodium heart healthy    Complete by:  As directed    Discharge instructions    Complete by:  As directed    Patient to follow-up with orthopedic surgeon in one or 2 weeks. Please call their office to confirm follow up appointment date and time.   Increase activity slowly    Complete by:  As directed      Current Discharge Medication List    START taking these medications   Details  aspirin EC 325 MG EC tablet Take 1 tablet (325 mg total) by mouth daily with breakfast. Qty: 30 tablet, Refills: 0      CONTINUE these medications which have CHANGED   Details  amLODipine (NORVASC) 10 MG tablet Take 1 tablet (10 mg total) by mouth daily.      CONTINUE these medications which have NOT CHANGED   Details  acetaminophen (TYLENOL) 500 MG tablet Take 1 tablet (500 mg total) by mouth every 8 (eight) hours as needed for  headache. Qty: 30 tablet, Refills: 0    Artificial Tear Solution (SOOTHE XP OP) Apply 1 drop to eye 2 (two) times daily.    citalopram (CELEXA) 20 MG tablet Take 20 mg by mouth daily.    feeding supplement, ENSURE ENLIVE, (ENSURE ENLIVE) LIQD Take 237 mLs by mouth 2 (two) times daily between meals. Qty: 237 mL, Refills: 12    levothyroxine (SYNTHROID, LEVOTHROID) 25 MCG tablet Take 25 mcg by mouth daily before breakfast.    lisinopril (PRINIVIL,ZESTRIL) 40 MG tablet Take 40 mg by mouth daily.    Memantine HCl-Donepezil HCl (NAMZARIC) 28-10 MG CP24 Take 1 tablet by mouth daily.     metoprolol tartrate (LOPRESSOR) 25 MG tablet Take 25 mg by mouth 2 (two) times daily.    !! gabapentin (NEURONTIN) 100 MG capsule Take 1 capsule (100 mg total) by mouth at bedtime. Qty: 30 capsule, Refills: 0    !! gabapentin (NEURONTIN) 300 MG capsule Take 600 mg by mouth 3 (three) times daily. 8 AM , 2 PM AND 8 PM.     LORazepam (ATIVAN) 2 MG/ML concentrated solution Take 0.5 mg by mouth 2 (two) times daily.    metFORMIN (GLUCOPHAGE) 500 MG tablet Take 250 mg by mouth 2 (two) times daily with a meal.    phenytoin (DILANTIN) 300 MG ER capsule Take 1 capsule (300 mg total) by mouth at bedtime. Qty: 30 capsule, Refills: 0     !! - Potential duplicate medications found. Please discuss with provider.    STOP taking these medications     aspirin 81 MG chewable tablet      polyethylene glycol (MIRALAX / GLYCOLAX) packet      pantoprazole (PROTONIX) 40 MG tablet        Allergies  Allergen Reactions  . Penicillins Nausea And Vomiting    Has patient had a PCN reaction causing immediate rash, facial/tongue/throat swelling, SOB or lightheadedness with hypotension: {unknown Has patient had a PCN reaction causing severe rash involving mucus membranes or skin necrosis: unknown Has patient had a PCN reaction that required hospitalization: unknown Has patient had a PCN reaction occurring within the last 10 years: unknown If all of the above answers are "NO", then may proceed with Cephalosporin use.   . Promethazine Hcl Nausea And Vomiting  . Sulfonamide Derivatives Other (See Comments)    unknown      The results of significant diagnostics from this hospitalization (including imaging, microbiology, ancillary and laboratory) are listed below for reference.    Significant Diagnostic Studies: Dg Lumbar Spine Complete  Result Date: 12/06/2016 CLINICAL DATA:  Generalized low back pain following fall on concrete yesterday EXAM: LUMBAR SPINE - COMPLETE 4+ VIEW COMPARISON:  09/22/2016  FINDINGS: Postsurgical changes are again noted at L2-3. Compression deformity at L2 is again noted and stable. No new compression deformity is seen. No hardware failure is noted. Diffuse aortic calcifications are noted. Mild osteophytic changes are seen. IMPRESSION: Stable appearing L2 compression deformity when compared with the prior exam. No new focal abnormality is seen. Electronically Signed   By: Alcide CleverMark  Lukens M.D.   On: 12/06/2016 13:39   Dg Hip Operative Unilat W Or W/o Pelvis Left  Result Date: 12/07/2016 CLINICAL DATA:  ORIF left hip. EXAM: OPERATIVE LEFT HIP (WITH PELVIS IF PERFORMED) TECHNIQUE: Fluoroscopic spot image(s) were submitted for interpretation post-operatively. COMPARISON:  12/06/2016 . FINDINGS: Patient status post surgical pinning left femoral neck fracture. Hardware intact. IMPRESSION: Postsurgical changes left hip. Electronically Signed   By: Maisie Fushomas  Register   On: 12/07/2016 12:21   Dg Hip Unilat W Or Wo Pelvis 2-3 Views Left  Result Date: 12/06/2016 CLINICAL DATA:  Left hip pain after fall EXAM: DG HIP (WITH OR WITHOUT PELVIS) 2-3V LEFT COMPARISON:  Pelvic radiograph from 09/22/2016 FINDINGS: There is an acute, closed, valgus angulated subcapital left femoral neck fracture with slight impaction superolaterally. The bony pelvis appears intact. Right bipolar hip arthroplasty is partially imaged and unremarkable in appearance. There is lower lumbar degenerative disc and facet arthropathy. IMPRESSION: Acute, closed, valgus angulated subcapital left femoral neck fracture with slight impaction. Electronically Signed   By: Tollie Ethavid  Kwon M.D.   On: 12/06/2016 15:23   Dg Hip Unilat W Or Wo Pelvis 2-3 Views Right  Result Date: 11/12/2016 CLINICAL DATA:  Found on floor.  Presumed unwitnessed fall. EXAM: DG HIP (WITH OR WITHOUT PELVIS) 2-3V RIGHT COMPARISON:  None. FINDINGS: There are intact appearances of the bipolar right hip hemiarthroplasty. There is no evidence of acute fracture.  There is no dislocation. Sacroiliac joints and pubic symphysis appear intact. IMPRESSION: Negative. Electronically Signed   By: Ellery Plunkaniel R Mitchell M.D.   On: 11/12/2016 04:40    Microbiology: Recent Results (from the past 240 hour(s))  Urine culture     Status: Abnormal   Collection Time: 12/06/16  4:42 PM  Result Value Ref Range Status   Specimen Description URINE, CATHETERIZED  Final   Special Requests NONE  Final   Culture (A)  Final    <10,000 COLONIES/mL INSIGNIFICANT GROWTH Performed at [facility]    Report Status 12/08/2016 FINAL  Final  Surgical pcr screen     Status: None   Collection Time: 12/06/16  7:31 PM  Result Value Ref Range Status   MRSA, PCR NEGATIVE NEGATIVE Final   Staphylococcus aureus NEGATIVE NEGATIVE Final    Comment:        The Xpert SA Assay (FDA approved for NASAL specimens in patients over 79 years of age), is one component of a comprehensive surveillance program.  Test performance has been validated by [facility] for patients greater than or equal to 79 year old. It is not intended to diagnose infection nor to guide or monitor treatment.      Labs: Basic Metabolic Panel:  Recent Labs Lab 12/06/16 1329 12/07/16 0613 12/08/16 0623 12/09/16 0555  NA 139 139 135 137  K 3.4* 3.5 3.6 3.2*  CL 106 107 102 106  CO2 25 26 22 25   GLUCOSE 128* 131* 199* 186*  BUN 12 15 11 10   CREATININE 0.65 0.78 0.56 0.63  CALCIUM 9.5 9.1 9.2 8.9   Liver Function Tests: No results for input(s): AST, ALT, ALKPHOS, BILITOT, PROT, ALBUMIN in the last 168 hours. No results for input(s): LIPASE, AMYLASE in the last 168 hours. No results for input(s): AMMONIA in the last 168 hours. CBC:  Recent Labs Lab 12/06/16 1329 12/07/16 0613 12/08/16 0623 12/09/16 0555 12/10/16 0515  WBC 9.0 6.6 9.4 7.4 7.3  NEUTROABS 6.0  --   --   --   --   HGB 13.8 12.0 13.2 11.7* 11.1*  HCT 40.8 36.8 38.9 34.6* 33.6*  MCV 88.5 89.1 87.4 88.0 87.7  PLT 127* 112*  133* 125* 121*   Cardiac Enzymes: No results for input(s): CKTOTAL, CKMB, CKMBINDEX, TROPONINI in the last 168 hours. BNP: BNP (last 3 results) No results for input(s): BNP in the last 8760 hours.  ProBNP (last 3 results) No results for input(s): PROBNP in the last  8760 hours.  CBG:  Recent Labs Lab 12/09/16 1642 12/09/16 2040 12/10/16 0002 12/10/16 0457 12/10/16 0751  GLUCAP 163* 155* 182* 135* 147*    Signed:  Penny Pia MD.  [facility] 12/10/2016, 10:52 AM

## 2016-12-17 ENCOUNTER — Ambulatory Visit: Payer: Self-pay | Admitting: Orthopedic Surgery

## 2016-12-18 ENCOUNTER — Encounter: Payer: Self-pay | Admitting: Orthopedic Surgery

## 2016-12-18 ENCOUNTER — Ambulatory Visit (INDEPENDENT_AMBULATORY_CARE_PROVIDER_SITE_OTHER): Payer: Medicare Other | Admitting: Orthopedic Surgery

## 2016-12-18 ENCOUNTER — Telehealth: Payer: Self-pay | Admitting: Orthopedic Surgery

## 2016-12-18 DIAGNOSIS — S72145D Nondisplaced intertrochanteric fracture of left femur, subsequent encounter for closed fracture with routine healing: Secondary | ICD-10-CM

## 2016-12-18 NOTE — Patient Instructions (Signed)
PATIENT WILL NEED STAPLES REMOVED TODAY  PATIENT WILL NEED XRAY AT [facility] WITHIN THIS WEEK (HOSPITAL ALREADY HAS THE ORDER)  PATIENT WILL NEED ANOTHER XRAY IN FOUR WEEKS PRIOR TO NEXT OFFICE VISIT AT , ORDER WILL BE ENTERED

## 2016-12-18 NOTE — Telephone Encounter (Signed)
Laure Kidneyebra Weaver called today and stated she was the daughter of Dr. Mort SawyersHarrison's patient, Tawni MillersBeverly Laprade.  She stated that she was to have come with her mother for today's  appointment but could not do so because she was sick.  She was asking questions and I told her that I could not answer any of her questions over the phone due to  HIPPA.  She wanted to know if I could speak to her or her father.  As I was speaking to this lady, I was already looking through Ms. Sabatino's chart for a DPR or POA with her name on it.  I couldn't find anything.  I again tried very nicely to explain to this lady Stanton KidneyDebra that I could not give any information to her.  I did tell her that possibly she may try to speak to someone at the facility where her mother was staying and see if maybe someone there would tell her what Dr.Harrison note said.  I did tell her that if she had a POA and she wanted to bring it by the office for us to copy, we would be glad to.

## 2016-12-18 NOTE — Progress Notes (Signed)
Patient ID: Tara PerkingBeverly J Veloso, female   DOB: 05/05/1935, 81 y.o.   MRN: [id number]  Post op visit   Chief Complaint  Patient presents with  . Follow-up    LEFT HIP FRACTURE, DOS 12/07/16    The wound has staples in it   Staple line is starting to get red staples need to come out.  The patient had an x-ray within the next week and then another one 4 weeks from now before she comes to the office  Weightbearing status is as tolerated

## 2016-12-19 NOTE — Addendum Note (Signed)
Addended by: Adella HareBOOTHE, JAIME B on: 12/19/2016 01:27 PM   Modules accepted: Orders

## 2016-12-26 ENCOUNTER — Ambulatory Visit
Admission: RE | Admit: 2016-12-26 | Discharge: 2016-12-26 | Disposition: A | Discharge status: Home / Self Care | Payer: Medicare Other | Source: Ambulatory Visit | Attending: Orthopedic Surgery | Admitting: Orthopedic Surgery

## 2016-12-26 DIAGNOSIS — S72145D Nondisplaced intertrochanteric fracture of left femur, subsequent encounter for closed fracture with routine healing: Secondary | ICD-10-CM

## 2017-01-14 ENCOUNTER — Ambulatory Visit (HOSPITAL_COMMUNITY)
Admission: RE | Admit: 2017-01-14 | Discharge: 2017-01-14 | Disposition: A | Discharge status: Home / Self Care | Payer: Medicare Other | Source: Ambulatory Visit | Attending: Orthopedic Surgery | Admitting: Orthopedic Surgery

## 2017-01-14 DIAGNOSIS — Z96641 Presence of right artificial hip joint: Secondary | ICD-10-CM | POA: Diagnosis not present

## 2017-01-14 DIAGNOSIS — S72145D Nondisplaced intertrochanteric fracture of left femur, subsequent encounter for closed fracture with routine healing: Secondary | ICD-10-CM | POA: Insufficient documentation

## 2017-01-14 DIAGNOSIS — M8580 Other specified disorders of bone density and structure, unspecified site: Secondary | ICD-10-CM | POA: Insufficient documentation

## 2017-01-14 DIAGNOSIS — X58XXXD Exposure to other specified factors, subsequent encounter: Secondary | ICD-10-CM | POA: Insufficient documentation

## 2017-01-15 ENCOUNTER — Ambulatory Visit: Payer: Self-pay | Admitting: Orthopedic Surgery

## 2017-01-18 ENCOUNTER — Ambulatory Visit (INDEPENDENT_AMBULATORY_CARE_PROVIDER_SITE_OTHER): Payer: Medicare Other | Admitting: Orthopedic Surgery

## 2017-01-18 ENCOUNTER — Encounter: Payer: Self-pay | Admitting: Orthopedic Surgery

## 2017-01-18 DIAGNOSIS — S72002D Fracture of unspecified part of neck of left femur, subsequent encounter for closed fracture with routine healing: Secondary | ICD-10-CM

## 2017-01-18 NOTE — Patient Instructions (Signed)
Weight-bear as tolerated  Return 6 weeks for x-rays

## 2017-01-18 NOTE — Progress Notes (Signed)
Patient ID: Don PerkingBeverly J Sirmon, female   DOB: 01/27/1935, 82 y.o.   MRN: [id number]  Follow up visit/  postop visit 6 weeks ORIF left femoral neck fracture with cannulated screw fixation  Chief Complaint  Patient presents with  . Follow-up    left hip fracture, DOS 12/07/16    The patient has severe dementia does not follow what I'm telling her. Her x-ray was done at the hospital she has 3 cannulated screws with washers in her left hip which is healing appropriately  She has a right bipolar prosthesis from another surgery which is stable she has lower lumbar fixation screws which are visible and no complications were noted there.  She will see us in 6 weeks and hopefully she will do therapy at the rehabilitation center.   Encounter Diagnosis  Name Primary?  . Closed displaced fracture of left femoral neck with routine healing Yes      11:44 AM Fuller CanadaStanley Meggen Spaziani, MD 01/18/2017

## 2017-01-24 ENCOUNTER — Other Ambulatory Visit: Payer: Self-pay | Admitting: *Deleted

## 2017-01-24 DIAGNOSIS — S72145D Nondisplaced intertrochanteric fracture of left femur, subsequent encounter for closed fracture with routine healing: Secondary | ICD-10-CM

## 2017-02-27 ENCOUNTER — Ambulatory Visit (HOSPITAL_COMMUNITY)
Admission: RE | Admit: 2017-02-27 | Discharge: 2017-02-27 | Disposition: A | Discharge status: Home / Self Care | Payer: Medicare Other | Source: Ambulatory Visit | Attending: Orthopedic Surgery | Admitting: Orthopedic Surgery

## 2017-02-27 DIAGNOSIS — M8580 Other specified disorders of bone density and structure, unspecified site: Secondary | ICD-10-CM | POA: Diagnosis not present

## 2017-02-27 DIAGNOSIS — Z981 Arthrodesis status: Secondary | ICD-10-CM | POA: Insufficient documentation

## 2017-02-27 DIAGNOSIS — Z96641 Presence of right artificial hip joint: Secondary | ICD-10-CM | POA: Diagnosis not present

## 2017-02-27 DIAGNOSIS — S72145D Nondisplaced intertrochanteric fracture of left femur, subsequent encounter for closed fracture with routine healing: Secondary | ICD-10-CM | POA: Diagnosis present

## 2017-02-27 DIAGNOSIS — X58XXXD Exposure to other specified factors, subsequent encounter: Secondary | ICD-10-CM | POA: Diagnosis not present

## 2017-03-01 ENCOUNTER — Encounter: Payer: Self-pay | Admitting: Orthopedic Surgery

## 2017-03-01 ENCOUNTER — Ambulatory Visit (INDEPENDENT_AMBULATORY_CARE_PROVIDER_SITE_OTHER): Payer: Self-pay | Admitting: Orthopedic Surgery

## 2017-03-01 DIAGNOSIS — S72002D Fracture of unspecified part of neck of left femur, subsequent encounter for closed fracture with routine healing: Secondary | ICD-10-CM

## 2017-03-01 NOTE — Progress Notes (Signed)
Chief Complaint  Patient presents with  . Follow-up    Fracture left femoral neck    3 months status post left femoral neck fracture date of surgery was 12/07/2016  She had 3 cannulated screws placed left hip  X-rays show that the fracture is healed hardware is in good position  The most inferior of the screws is just at the cortical margin of the femoral head and will need further follow-up with x-ray in 3 months  Encounter Diagnosis  Name Primary?  . Closed displaced fracture of left femoral neck with routine healing Yes

## 2017-03-06 ENCOUNTER — Encounter (HOSPITAL_COMMUNITY): Payer: Self-pay | Admitting: Emergency Medicine

## 2017-03-06 ENCOUNTER — Emergency Department (HOSPITAL_COMMUNITY): Payer: Medicare Other

## 2017-03-06 ENCOUNTER — Emergency Department (HOSPITAL_COMMUNITY)
Admission: EM | Admit: 2017-03-06 | Discharge: 2017-03-06 | Disposition: A | Discharge status: Home / Self Care | Payer: Medicare Other | Attending: Emergency Medicine | Admitting: Emergency Medicine

## 2017-03-06 DIAGNOSIS — E119 Type 2 diabetes mellitus without complications: Secondary | ICD-10-CM | POA: Diagnosis not present

## 2017-03-06 DIAGNOSIS — E039 Hypothyroidism, unspecified: Secondary | ICD-10-CM | POA: Insufficient documentation

## 2017-03-06 DIAGNOSIS — Z79899 Other long term (current) drug therapy: Secondary | ICD-10-CM | POA: Insufficient documentation

## 2017-03-06 DIAGNOSIS — J189 Pneumonia, unspecified organism: Secondary | ICD-10-CM | POA: Insufficient documentation

## 2017-03-06 DIAGNOSIS — G309 Alzheimer's disease, unspecified: Secondary | ICD-10-CM | POA: Diagnosis not present

## 2017-03-06 DIAGNOSIS — I10 Essential (primary) hypertension: Secondary | ICD-10-CM | POA: Insufficient documentation

## 2017-03-06 DIAGNOSIS — Z87891 Personal history of nicotine dependence: Secondary | ICD-10-CM | POA: Insufficient documentation

## 2017-03-06 DIAGNOSIS — Z7982 Long term (current) use of aspirin: Secondary | ICD-10-CM | POA: Diagnosis not present

## 2017-03-06 DIAGNOSIS — R05 Cough: Secondary | ICD-10-CM | POA: Diagnosis present

## 2017-03-06 LAB — URINALYSIS, ROUTINE W REFLEX MICROSCOPIC
Bilirubin Urine: NEGATIVE
Glucose, UA: NEGATIVE mg/dL
Ketones, ur: NEGATIVE mg/dL
Nitrite: NEGATIVE
Protein, ur: 100 mg/dL — AB
Specific Gravity, Urine: 1.028 (ref 1.005–1.030)
pH: 5 (ref 5.0–8.0)

## 2017-03-06 LAB — CBC
HCT: 41.8 % (ref 36.0–46.0)
Hemoglobin: 14.3 g/dL (ref 12.0–15.0)
MCH: 29.5 pg (ref 26.0–34.0)
MCHC: 34.2 g/dL (ref 30.0–36.0)
MCV: 86.4 fL (ref 78.0–100.0)
Platelets: 144 10*3/uL — ABNORMAL LOW (ref 150–400)
RBC: 4.84 MIL/uL (ref 3.87–5.11)
RDW: 14.3 % (ref 11.5–15.5)
WBC: 17.6 10*3/uL — ABNORMAL HIGH (ref 4.0–10.5)

## 2017-03-06 LAB — COMPREHENSIVE METABOLIC PANEL
ALBUMIN: 4 g/dL (ref 3.5–5.0)
ALK PHOS: 103 U/L (ref 38–126)
ALT: 23 U/L (ref 14–54)
AST: 24 U/L (ref 15–41)
Anion gap: 10 (ref 5–15)
BILIRUBIN TOTAL: 0.4 mg/dL (ref 0.3–1.2)
BUN: 11 mg/dL (ref 6–20)
CO2: 25 mmol/L (ref 22–32)
CREATININE: 0.77 mg/dL (ref 0.44–1.00)
Calcium: 9.8 mg/dL (ref 8.9–10.3)
Chloride: 100 mmol/L — ABNORMAL LOW (ref 101–111)
GFR calc Af Amer: >60 mL/min (ref >60)
GLUCOSE: 200 mg/dL — AB (ref 65–99)
POTASSIUM: 4.1 mmol/L (ref 3.5–5.1)
Sodium: 135 mmol/L (ref 135–145)
TOTAL PROTEIN: 7.9 g/dL (ref 6.5–8.1)

## 2017-03-06 LAB — CBG MONITORING, ED: Glucose-Capillary: 187 mg/dL — ABNORMAL HIGH (ref 65–99)

## 2017-03-06 MED ORDER — LEVOFLOXACIN 500 MG PO TABS
500.0000 mg | ORAL_TABLET | Freq: Once | ORAL | Status: AC
  Administered 2017-03-06: 500 mg via ORAL
  Filled 2017-03-06: qty 1

## 2017-03-06 MED ORDER — LEVOFLOXACIN 500 MG PO TABS: 500.0000 mg | ORAL_TABLET | Freq: Every day | ORAL | 0 refills | Status: AC

## 2017-03-06 NOTE — ED Notes (Signed)
Called [facility] to transport Pt back to [facility].

## 2017-03-06 NOTE — ED Triage Notes (Signed)
Pt from [facility]. Staff noticed patient more weak than normal. HX of dementia.Vomited x1 this am.

## 2017-03-06 NOTE — ED Provider Notes (Addendum)
[facility]-EMERGENCY DEPT Provider Note   CSN: [id number] Arrival date & time: 03/06/17  1253     History   Chief Complaint Chief Complaint  Patient presents with  . Weakness    Level V caveat: Dementia   HPI [name] is a 82 y.o. female.  HPI Patient is a 82 year old female with a history of Alzheimer's dementia was brought to the emergency department from her facility for possible generalized weakness as compared to her baseline.  No other symptoms were described to the paramedics.  The patient is without complaints at this time.   Past Medical History:  Diagnosis Date  . Alzheimer disease    per facility paperwork dated 05/04/2016  . Anxiety   . Dementia   . Diabetes mellitus   . Hypercholesteremia   . Hypertension   . Intracranial bleeding (HCC)    10 years ago  . Seizures (HCC)    approx 10 years when she had brain bleed  . Thyroid disease     Patient Active Problem List   Diagnosis Date Noted  . Closed fracture of neck of left femur (HCC)   . Closed fracture of left hip (HCC) 12/06/2016  . Right hip pain   . Palliative care encounter   . Encounter for hospice care discussion   . Partial symptomatic epilepsy with complex partial seizures, not intractable, without status epilepticus (HCC)   . Altered mental status 07/29/2016  . Hypokalemia 07/29/2016  . Displaced fracture of right femoral neck (HCC) 05/07/2016  . Hip fracture (HCC) 05/04/2016  . Closed right hip fracture (HCC) 05/04/2016  . Type 2 diabetes mellitus with hyperglycemia (HCC) 05/04/2016  . Dementia 05/04/2016  . Closed fracture of part of upper end of humerus 01/03/2015  . Hypothyroidism 12/19/2010  . DIABETES MELLITUS, TYPE II 12/19/2010  . DEMENTIA 12/19/2010  . ANXIETY 12/19/2010  . Essential hypertension 12/19/2010  . DIZZINESS 12/19/2010  . FATIGUE 12/19/2010  . HEADACHE 12/19/2010    Past Surgical History:  Procedure Laterality Date  . ABDOMINAL HYSTERECTOMY    . ANTERIOR  APPROACH HEMI HIP ARTHROPLASTY Right 05/07/2016   Procedure: ANTERIOR APPROACH HEMI HIP ARTHROPLASTY VERSUS TOTAL HIP ARTHROPLASTY;  Surgeon: Samson Frederic, MD;  Location: [facility] OR;  Service: Orthopedics;  Laterality: Right;  . CHOLECYSTECTOMY    . HIP PINNING,CANNULATED Left 12/07/2016   Procedure: CANNULATED HIP PINNING;  Surgeon: Vickki Hearing, MD;  Location: [facility] ORS;  Service: Orthopedics;  Laterality: Left;    OB History    No data available       Home Medications    Prior to Admission medications   Medication Sig Start Date End Date Taking? Authorizing Provider  acetaminophen (TYLENOL) 500 MG tablet Take 1 tablet (500 mg total) by mouth every 8 (eight) hours as needed for headache. Patient taking differently: Take 1,000 mg by mouth every 8 (eight) hours.  08/02/16   Mauricio Annett Gula, MD  amLODipine (NORVASC) 10 MG tablet Take 1 tablet (10 mg total) by mouth daily. 12/10/16   Penny Pia, MD  Artificial Tear Solution (SOOTHE XP OP) Apply 1 drop to eye 2 (two) times daily.    Historical Provider, MD  aspirin EC 325 MG EC tablet Take 1 tablet (325 mg total) by mouth daily with breakfast. 12/11/16   Penny Pia, MD  citalopram (CELEXA) 20 MG tablet Take 20 mg by mouth daily.    Historical Provider, MD  feeding supplement, ENSURE ENLIVE, (ENSURE ENLIVE) LIQD Take 237 mLs by mouth 2 (  two) times daily between meals. 05/09/16   Shanker Levora DredgeM Ghimire, MD  gabapentin (NEURONTIN) 100 MG capsule Take 1 capsule (100 mg total) by mouth at bedtime. 08/02/16   Mauricio Annett Gulaaniel Arrien, MD  gabapentin (NEURONTIN) 300 MG capsule Take 600 mg by mouth 3 (three) times daily. 8 AM , 2 PM AND 8 PM.     Historical Provider, MD  levofloxacin (LEVAQUIN) 500 MG tablet Take 1 tablet (500 mg total) by mouth daily. 03/06/17   Azalia BilisKevin Keiarah Orlowski, MD  levothyroxine (SYNTHROID, LEVOTHROID) 25 MCG tablet Take 25 mcg by mouth daily before breakfast.    Historical Provider, MD  lisinopril (PRINIVIL,ZESTRIL) 40 MG tablet Take 40  mg by mouth daily.    Historical Provider, MD  LORazepam (ATIVAN) 2 MG/ML concentrated solution Take 0.3 mLs (0.6 mg total) by mouth 2 (two) times daily. 12/10/16   Penny Piarlando Vega, MD  Memantine HCl-Donepezil HCl [facility](NAMZARIC) 28-10 MG CP24 Take 1 tablet by mouth daily.    Historical Provider, MD  metFORMIN (GLUCOPHAGE) 500 MG tablet Take 250 mg by mouth 2 (two) times daily with a meal.    Historical Provider, MD  metoprolol tartrate (LOPRESSOR) 25 MG tablet Take 25 mg by mouth 2 (two) times daily.    Historical Provider, MD  phenytoin (DILANTIN) 300 MG ER capsule Take 1 capsule (300 mg total) by mouth at bedtime. 08/02/16   Mauricio Annett Gulaaniel Arrien, MD    Family History Family History  Problem Relation Age of Onset  . Hypertension Other     Social History Social History  Substance Use Topics  . Smoking status: Former Games developermoker  . Smokeless tobacco: Former NeurosurgeonUser  . Alcohol use No     Allergies   Penicillins; Promethazine hcl; and Sulfonamide derivatives   Review of Systems Review of Systems  All other systems reviewed and are negative.    Physical Exam Updated Vital Signs BP 110/62 (BP Location: Right Arm)   Pulse 77   Temp 98 F (36.7 C) (Oral)   Resp 18   SpO2 97%   Physical Exam  Constitutional: She appears well-developed and well-nourished. No distress.  HENT:  Head: Normocephalic and atraumatic.  Eyes: EOM are normal. Pupils are equal, round, and reactive to light.  Neck: Normal range of motion.  Cardiovascular: Normal rate, regular rhythm and normal heart sounds.   Pulmonary/Chest: Effort normal and breath sounds normal.  Abdominal: Soft. She exhibits no distension. There is no tenderness.  Musculoskeletal: Normal range of motion.  Neurological: She is alert.  5/5 strength in major muscle groups of  bilateral upper and lower extremities. Speech normal. No facial asymetry.   Skin: Skin is warm and dry.  Psychiatric: She has a normal mood and affect. Judgment normal.    Nursing note and vitals reviewed.    ED Treatments / Results  Labs (all labs ordered are listed, but only abnormal results are displayed) Labs Reviewed  CBC - Abnormal; Notable for the following:       Result Value   WBC 17.6 (*)    Platelets 144 (*)    All other components within normal limits  URINALYSIS, ROUTINE W REFLEX MICROSCOPIC - Abnormal; Notable for the following:    APPearance HAZY (*)    Hgb urine dipstick MODERATE (*)    Protein, ur 100 (*)    Leukocytes, UA TRACE (*)    Bacteria, UA RARE (*)    Non Squamous Epithelial 0-5 (*)    All other components within normal limits  COMPREHENSIVE  METABOLIC PANEL - Abnormal; Notable for the following:    Chloride 100 (*)    Glucose, Bld 200 (*)    All other components within normal limits  CBG MONITORING, ED - Abnormal; Notable for the following:    Glucose-Capillary 187 (*)    All other components within normal limits  URINE CULTURE    EKG  EKG Interpretation  Date/Time:  Wednesday March 06 2017 13:23:40 EST Ventricular Rate:  75 PR Interval:    QRS Duration: 76 QT Interval:  402 QTC Calculation: 449 R Axis:   50 Text Interpretation:  Sinus rhythm Low voltage, precordial leads No significant change was found Confirmed by Sibel Khurana  MD, Caryn Bee ([id number]) on 03/06/2017 2:39:38 PM       Radiology Dg Chest 2 View  Result Date: 03/06/2017 CLINICAL DATA:  Weakness EXAM: CHEST  2 VIEW COMPARISON:  08/08/2016 FINDINGS: Low lung volumes. Mild atelectasis or infiltrate at the right base. No effusion. Stable cardiomediastinal silhouette with atherosclerosis. No pneumothorax. Partially visualized spinal hardware. IMPRESSION: Low lung volumes with mild basilar atelectasis versus small infiltrate on the right. Electronically Signed   By: Jasmine Pang M.D.   On: 03/06/2017 14:01    Procedures Procedures (including critical care time)  Medications Ordered in ED Medications  levofloxacin (LEVAQUIN) tablet 500 mg (not administered)      Initial Impression / Assessment and Plan / ED Course  I have reviewed the triage vital signs and the nursing notes.  Pertinent labs & imaging results that were available during my care of the patient were reviewed by me and considered in my medical decision making (see chart for details).     Rather normal examination.  Atelectasis versus infiltrate.  Patient with white blood cells in the urine.  Urine culture sent.  Patient be treated with Levaquin.  Primary care follow-up.  Patient be discharged back to her facility.  Vital signs are stable.  I did not think the patient needs admission the hospital this time.  Final Clinical Impressions(s) / ED Diagnoses   Final diagnoses:  Community acquired pneumonia, unspecified laterality    New Prescriptions New Prescriptions   LEVOFLOXACIN (LEVAQUIN) 500 MG TABLET    Take 1 tablet (500 mg total) by mouth daily.     Azalia Bilis, MD 03/06/17 1430    Azalia Bilis, MD 03/06/17 1440

## 2017-03-08 LAB — URINE CULTURE

## 2017-04-23 IMAGING — DX DG THORACIC SPINE 2V
3 series · 3 of 3 positions shown · non-contrast
Comparison: Chest radiograph performed 06/14/2015

CLINICAL DATA: Status post unwitnessed fall. Upper back pain.
Initial encounter.

EXAM:
THORACIC SPINE 2 VIEWS

[t-spine ap]
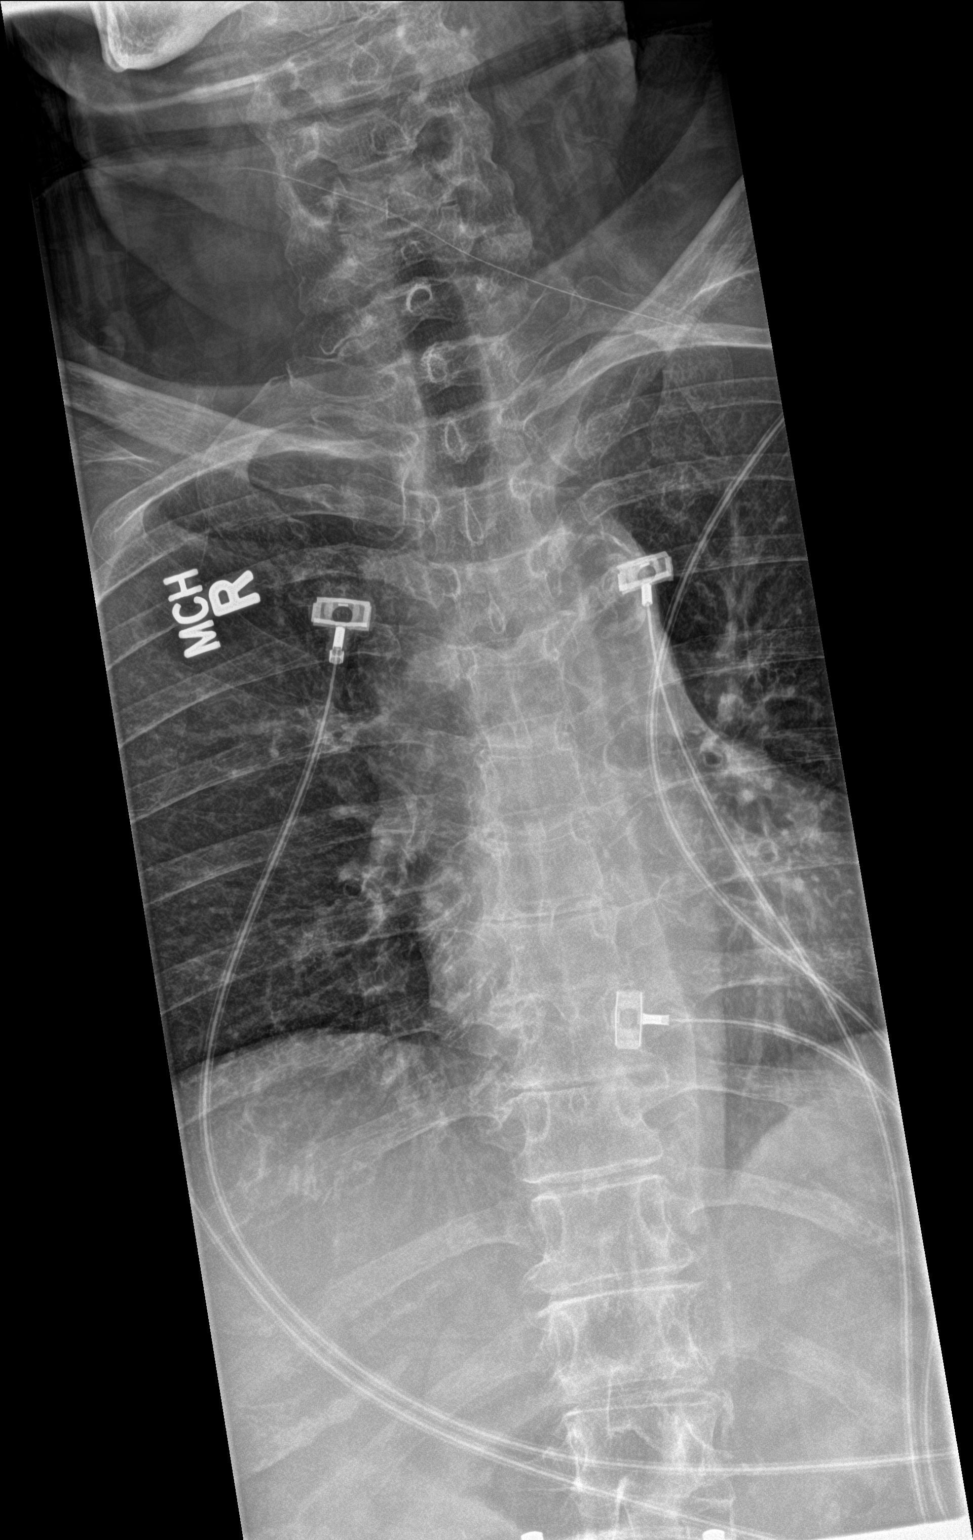

[t-spine lat]
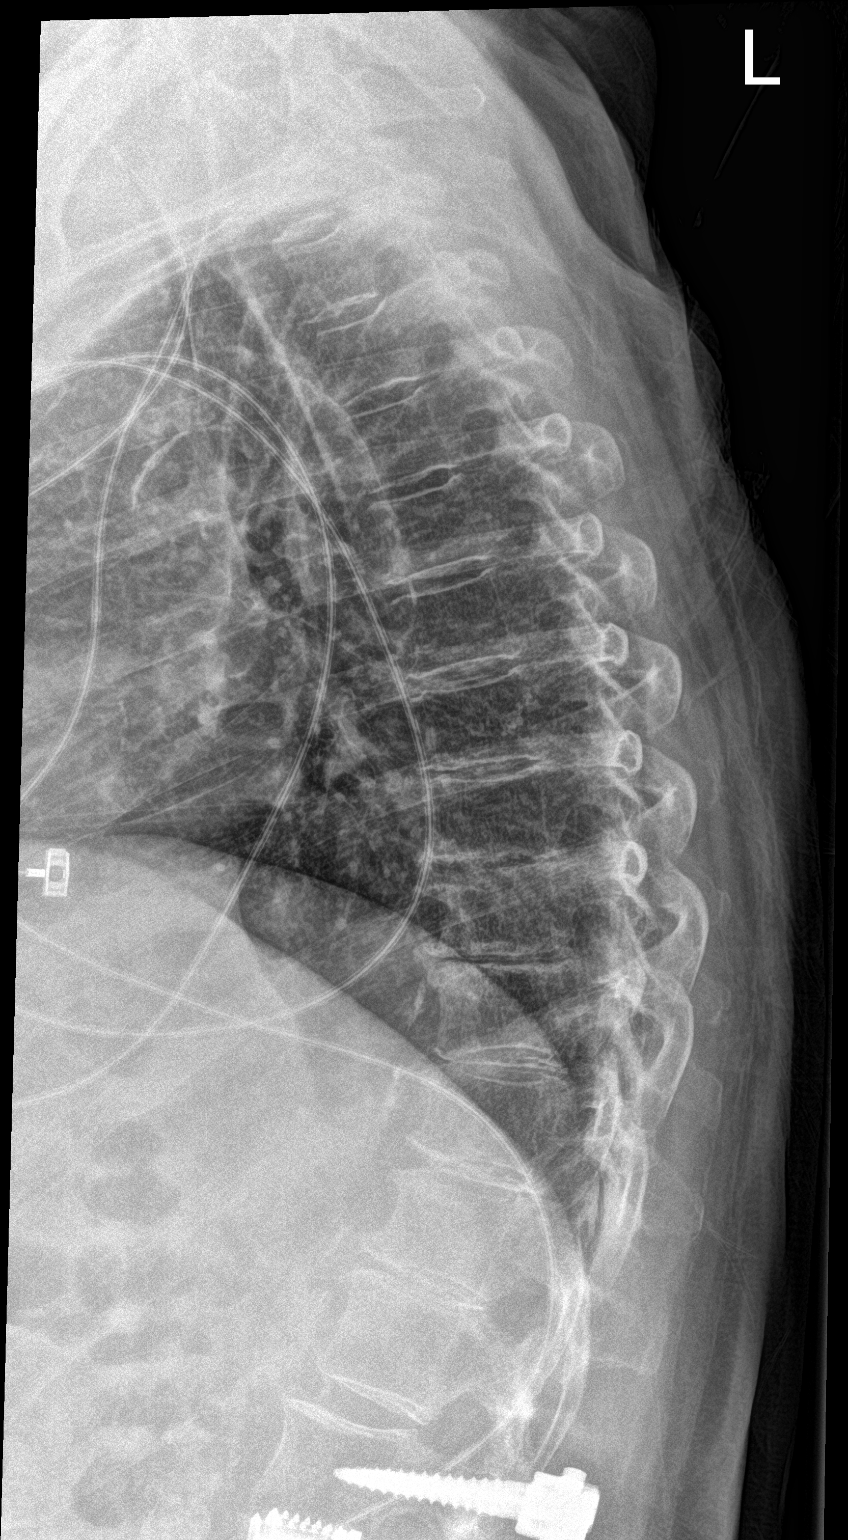

[t-spine swimmers]
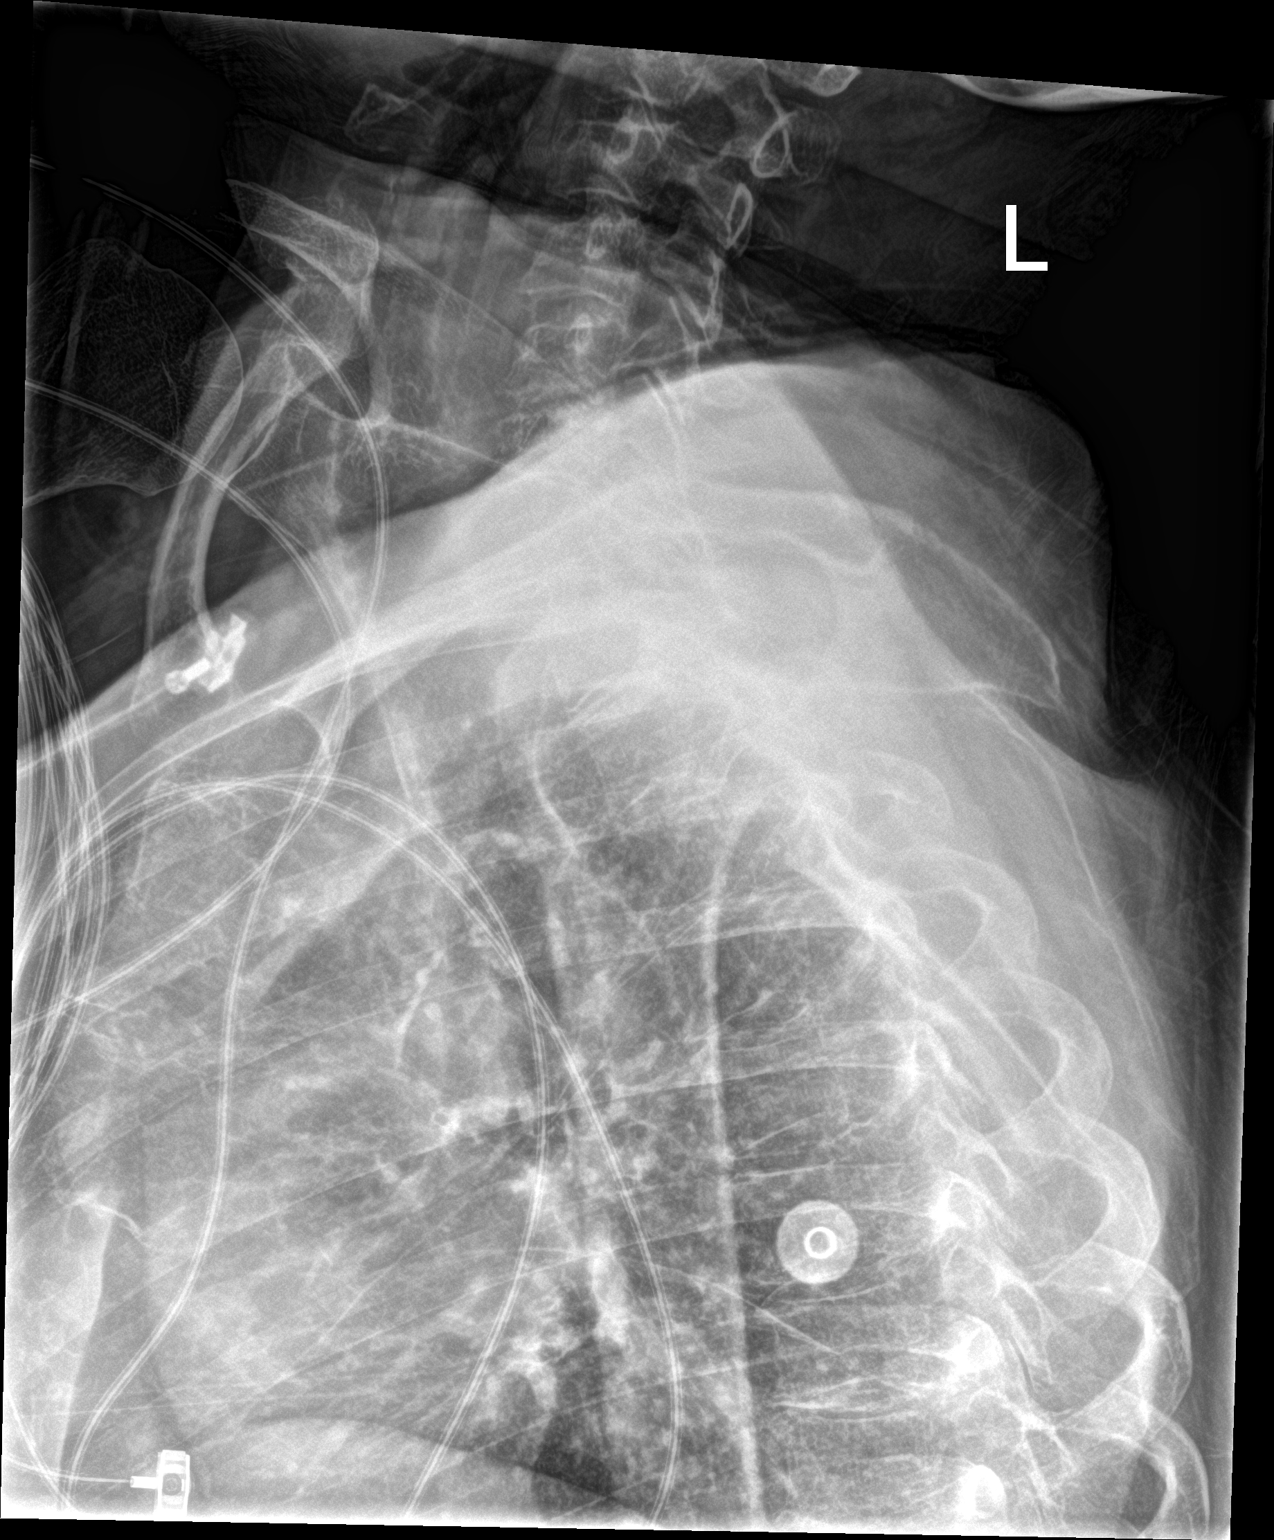

[3 of 3 positions shown; findings below may reference images not displayed]

FINDINGS: There is no evidence of fracture or subluxation. Vertebral bodies
demonstrate normal height and alignment. Intervertebral disc spaces
are preserved. Lumbar spinal fusion hardware is partially imaged.

The visualized portions of both lungs are clear. The mediastinum is
unremarkable in appearance.
IMPRESSION: No evidence of fracture or subluxation along the thoracic spine.

## 2017-04-23 IMAGING — DX DG LUMBAR SPINE COMPLETE 4+V
5 series · 5 of 5 positions shown · non-contrast
Comparison: None.

CLINICAL DATA: Pain status post unwitnessed fall.

EXAM:
LUMBAR SPINE - COMPLETE 4+ VIEW

[l-spine ap]
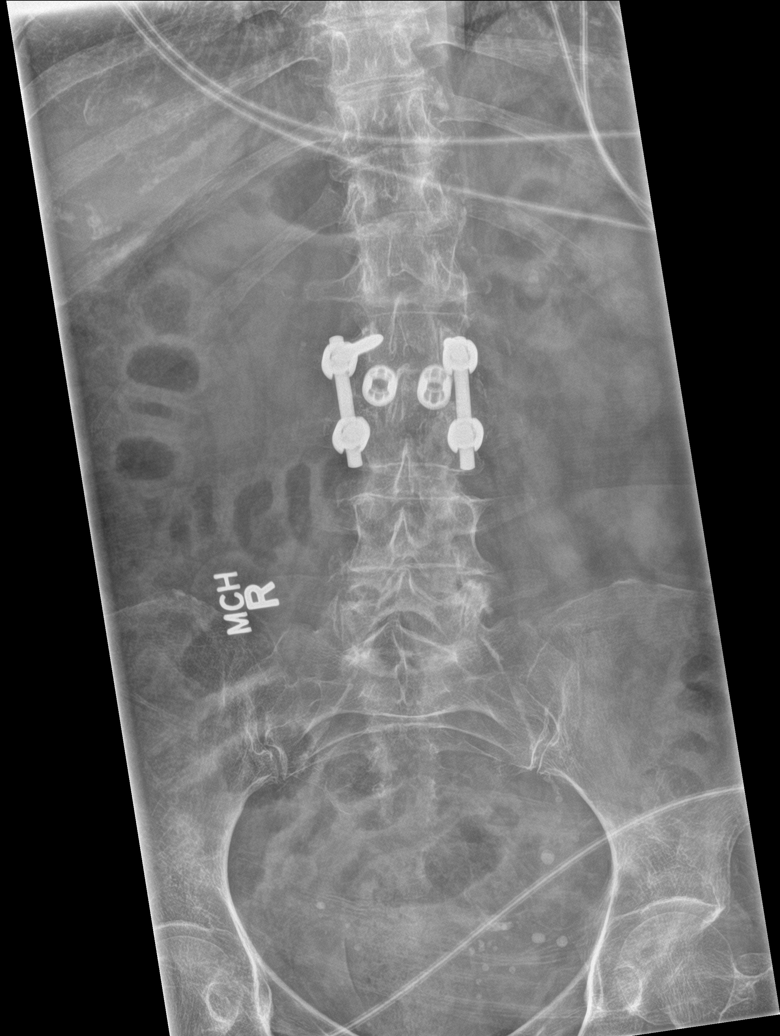

[l-spine obl (1 of 2)]
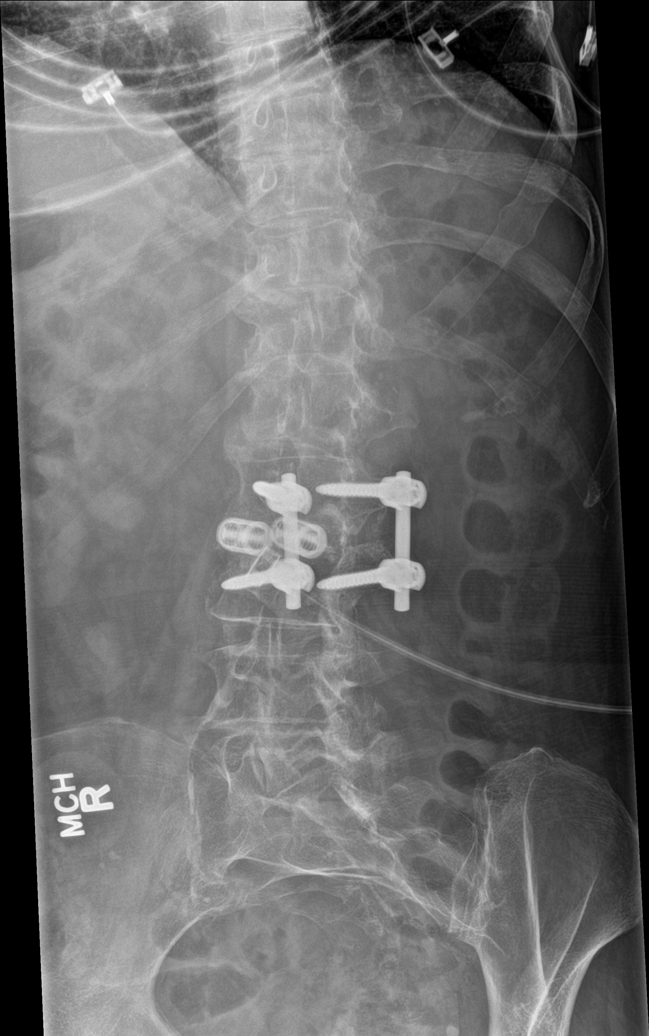

[l-spine obl (2 of 2)]
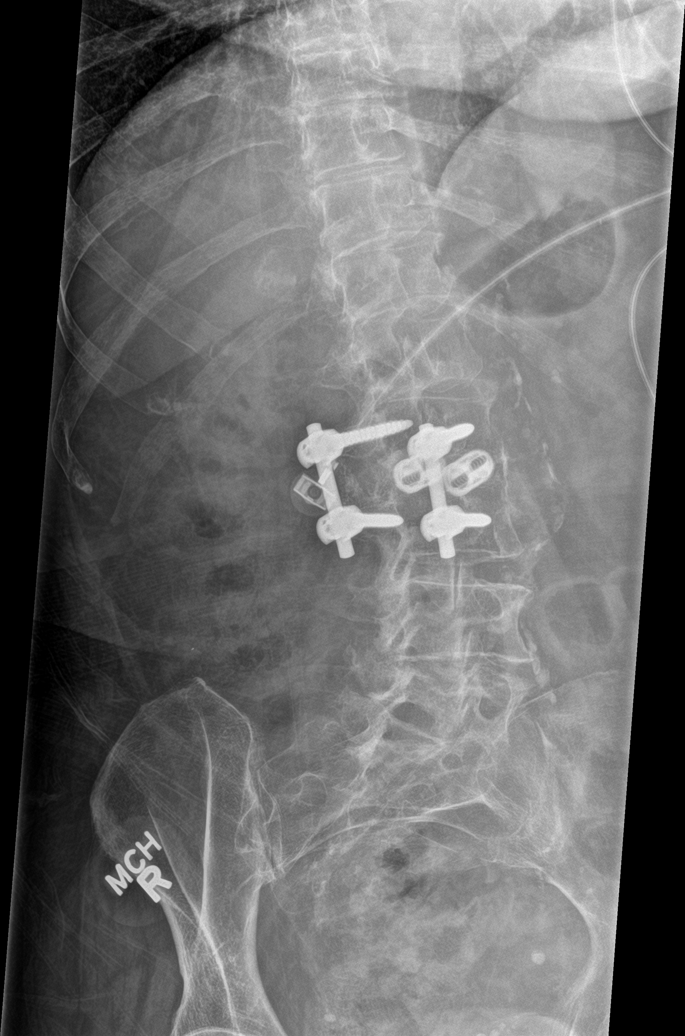

[l-spine lat]
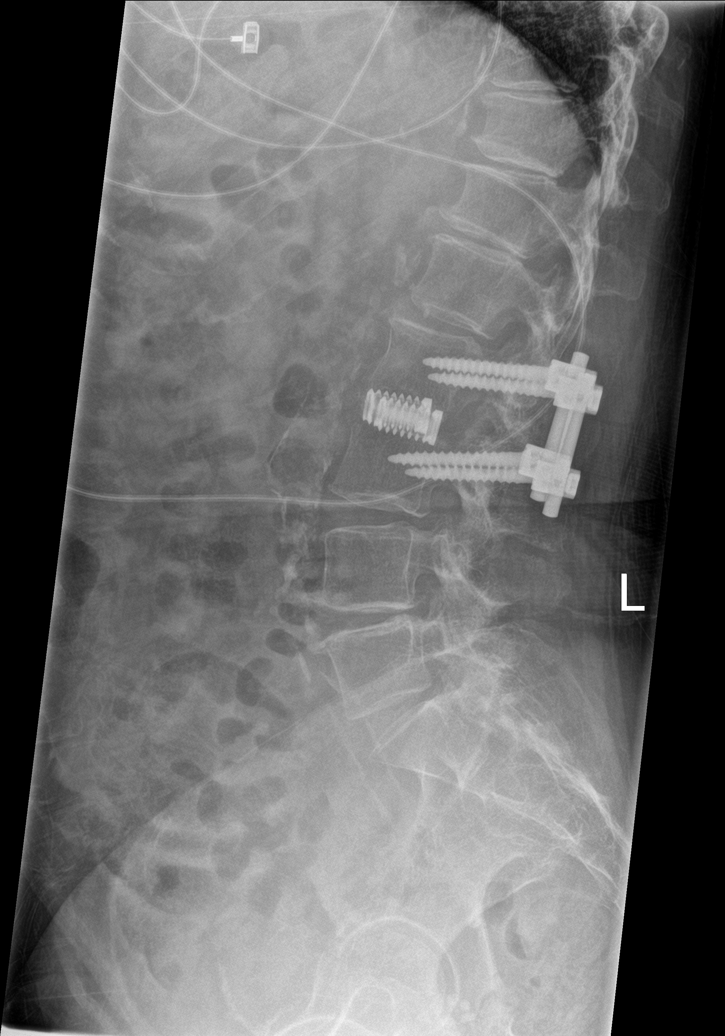

[l-spine spot]
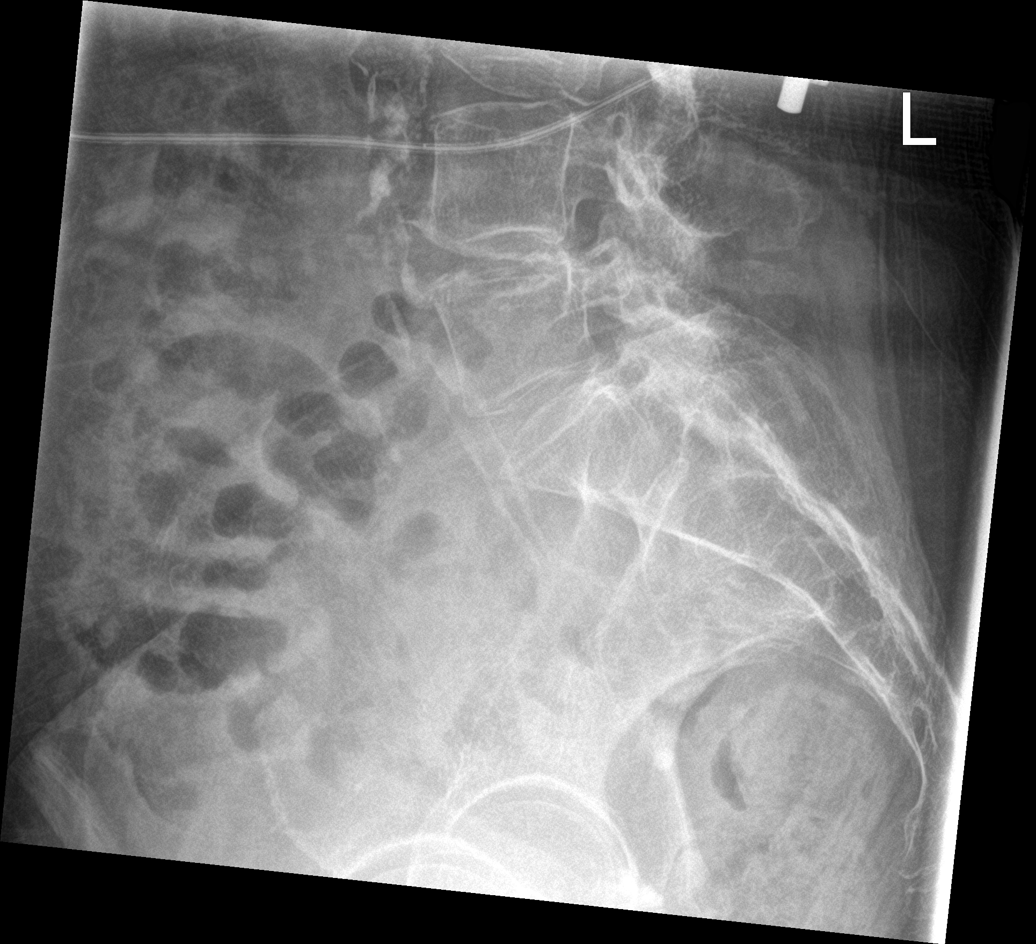

[5 of 5 positions shown; findings below may reference images not displayed]

FINDINGS: There is no evidence of lumbar spine fracture. Alignment is normal.
There is evidence of prior anterior and posterior fixation at L2
-L3. Multilevel degenerative disc disease is seen. There is mild
anterior compression deformity of T12 vertebral body, likely
degenerative in nature. Atherosclerotic disease of the aorta is
seen.
IMPRESSION: No evidence acute fracture of the lumbosacral spine.

Multilevel degenerative disc disease.

Post L2-L3 fusion.

## 2017-05-31 ENCOUNTER — Ambulatory Visit (HOSPITAL_COMMUNITY)
Admission: RE | Admit: 2017-05-31 | Discharge: 2017-05-31 | Disposition: A | Discharge status: Home / Self Care | Payer: Medicare Other | Source: Ambulatory Visit | Attending: Orthopedic Surgery | Admitting: Orthopedic Surgery

## 2017-05-31 ENCOUNTER — Other Ambulatory Visit: Payer: Self-pay | Admitting: *Deleted

## 2017-05-31 DIAGNOSIS — Z96641 Presence of right artificial hip joint: Secondary | ICD-10-CM | POA: Insufficient documentation

## 2017-05-31 DIAGNOSIS — S72145D Nondisplaced intertrochanteric fracture of left femur, subsequent encounter for closed fracture with routine healing: Secondary | ICD-10-CM | POA: Diagnosis present

## 2017-05-31 DIAGNOSIS — I739 Peripheral vascular disease, unspecified: Secondary | ICD-10-CM | POA: Insufficient documentation

## 2017-05-31 DIAGNOSIS — X58XXXD Exposure to other specified factors, subsequent encounter: Secondary | ICD-10-CM | POA: Diagnosis not present

## 2017-05-31 DIAGNOSIS — Z981 Arthrodesis status: Secondary | ICD-10-CM | POA: Insufficient documentation

## 2017-06-03 ENCOUNTER — Ambulatory Visit (INDEPENDENT_AMBULATORY_CARE_PROVIDER_SITE_OTHER): Admitting: Orthopedic Surgery

## 2017-06-03 DIAGNOSIS — S72002D Fracture of unspecified part of neck of left femur, subsequent encounter for closed fracture with routine healing: Secondary | ICD-10-CM

## 2017-06-03 NOTE — Progress Notes (Signed)
Patient ID: Tara PerkingBeverly J Milholland, female   DOB: 08/20/1935, 82 y.o.   MRN: [id number]  Post op visit   3 month follow-up after left hip fracture with open treatment internal fixation on 12/07/2016 X-rays of the left hip show 3 screws triangular pattern with washers fracture looks good  The patient is not ambulating but it's not related to the hip surgery  Her hip fracture has healed her screws are intact she is discharged and released

## 2017-06-12 ENCOUNTER — Encounter (HOSPITAL_COMMUNITY): Payer: Self-pay | Admitting: Emergency Medicine

## 2017-06-12 ENCOUNTER — Emergency Department (HOSPITAL_COMMUNITY): Payer: Medicare Other

## 2017-06-12 ENCOUNTER — Emergency Department (HOSPITAL_COMMUNITY)
Admission: EM | Admit: 2017-06-12 | Discharge: 2017-06-12 | Disposition: A | Discharge status: Skilled Nursing Facility | Payer: Medicare Other | Attending: Emergency Medicine | Admitting: Emergency Medicine

## 2017-06-12 DIAGNOSIS — W050XXA Fall from non-moving wheelchair, initial encounter: Secondary | ICD-10-CM | POA: Diagnosis not present

## 2017-06-12 DIAGNOSIS — Y929 Unspecified place or not applicable: Secondary | ICD-10-CM | POA: Insufficient documentation

## 2017-06-12 DIAGNOSIS — Y998 Other external cause status: Secondary | ICD-10-CM | POA: Diagnosis not present

## 2017-06-12 DIAGNOSIS — W19XXXA Unspecified fall, initial encounter: Secondary | ICD-10-CM

## 2017-06-12 DIAGNOSIS — Z79899 Other long term (current) drug therapy: Secondary | ICD-10-CM | POA: Insufficient documentation

## 2017-06-12 DIAGNOSIS — S0990XA Unspecified injury of head, initial encounter: Secondary | ICD-10-CM | POA: Diagnosis present

## 2017-06-12 DIAGNOSIS — I10 Essential (primary) hypertension: Secondary | ICD-10-CM | POA: Diagnosis not present

## 2017-06-12 DIAGNOSIS — S42035A Nondisplaced fracture of lateral end of left clavicle, initial encounter for closed fracture: Secondary | ICD-10-CM | POA: Diagnosis not present

## 2017-06-12 DIAGNOSIS — G309 Alzheimer's disease, unspecified: Secondary | ICD-10-CM | POA: Insufficient documentation

## 2017-06-12 DIAGNOSIS — Z7982 Long term (current) use of aspirin: Secondary | ICD-10-CM | POA: Diagnosis not present

## 2017-06-12 DIAGNOSIS — E119 Type 2 diabetes mellitus without complications: Secondary | ICD-10-CM | POA: Insufficient documentation

## 2017-06-12 DIAGNOSIS — Y939 Activity, unspecified: Secondary | ICD-10-CM | POA: Diagnosis not present

## 2017-06-12 DIAGNOSIS — Z7984 Long term (current) use of oral hypoglycemic drugs: Secondary | ICD-10-CM | POA: Insufficient documentation

## 2017-06-12 DIAGNOSIS — S42034A Nondisplaced fracture of lateral end of right clavicle, initial encounter for closed fracture: Secondary | ICD-10-CM

## 2017-06-12 HISTORY — DX: Gastro-esophageal reflux disease without esophagitis: K21.9

## 2017-06-12 HISTORY — DX: Hyperlipidemia, unspecified: E78.5

## 2017-06-12 LAB — URINALYSIS, ROUTINE W REFLEX MICROSCOPIC
Bacteria, UA: NONE SEEN
Bilirubin Urine: NEGATIVE
Glucose, UA: >=500 mg/dL — AB
Ketones, ur: NEGATIVE mg/dL
Leukocytes, UA: NEGATIVE
Nitrite: NEGATIVE
Protein, ur: 30 mg/dL — AB
Specific Gravity, Urine: 1.024 (ref 1.005–1.030)
pH: 5 (ref 5.0–8.0)

## 2017-06-12 LAB — CBG MONITORING, ED: GLUCOSE-CAPILLARY: 111 mg/dL — AB (ref 65–99)

## 2017-06-12 MED ORDER — TRAMADOL HCL 50 MG PO TABS: 50.0000 mg | ORAL_TABLET | Freq: Four times a day (QID) | ORAL | 0 refills | Status: AC | PRN

## 2017-06-12 NOTE — ED Notes (Signed)
Brooke, NT performed in and out cath with witness: Education administratorMeagan Jashua Knaak,rn

## 2017-06-12 NOTE — ED Notes (Signed)
[name] updated on pt care and that we are still waiting on [facility] to transport pt.  [name] stated that pt eats purreed diet and they have her a tray saved for when she returns.  [name] states that she can eat soft foods.  Primary RN [name] notified.

## 2017-06-12 NOTE — ED Notes (Addendum)
Pt made aware to return if symptoms worsen or if any life threatening symptoms occur.  [facility]. Pt diaper checked and is dry at this time upon discharge.
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
## 2017-06-24 NOTE — ED Provider Notes (Signed)
[facility]-EMERGENCY DEPT Provider Note   CSN: [id number] Arrival date & time: 06/12/17  1214     History   Chief Complaint Chief Complaint  Patient presents with  . Fall    HPI [name] is a 82 y.o. female.  HPI  81yf presenting after fall. Reportedly fell forward and struck head. No reported LOC. Hx of dementia. Reportedly at baseline. Also reports that injured R shoulder in separate, but recent fall. She doesn't provide much u  Past Medical History:  Diagnosis Date  . Alzheimer disease    per facility paperwork dated 05/04/2016  . Anxiety   . Dementia   . Diabetes mellitus   . GERD (gastroesophageal reflux disease)   . Hypercholesteremia   . Hyperlipidemia   . Hypertension   . Intracranial bleeding (HCC)    10 years ago  . Seizures (HCC)    approx 10 years when she had brain bleed  . Thyroid disease     Patient Active Problem List   Diagnosis Date Noted  . Closed fracture of neck of left femur (HCC)   . Closed fracture of left hip (HCC) 12/06/2016  . Right hip pain   . Palliative care encounter   . Encounter for hospice care discussion   . Partial symptomatic epilepsy with complex partial seizures, not intractable, without status epilepticus (HCC)   . Altered mental status 07/29/2016  . Hypokalemia 07/29/2016  . Displaced fracture of right femoral neck (HCC) 05/07/2016  . Hip fracture (HCC) 05/04/2016  . Closed right hip fracture (HCC) 05/04/2016  . Type 2 diabetes mellitus with hyperglycemia (HCC) 05/04/2016  . Dementia 05/04/2016  . Closed fracture of part of upper end of humerus 01/03/2015  . Hypothyroidism 12/19/2010  . DIABETES MELLITUS, TYPE II 12/19/2010  . DEMENTIA 12/19/2010  . ANXIETY 12/19/2010  . Essential hypertension 12/19/2010  . DIZZINESS 12/19/2010  . FATIGUE 12/19/2010  . HEADACHE 12/19/2010    Past Surgical History:  Procedure Laterality Date  . ABDOMINAL HYSTERECTOMY    . ANTERIOR APPROACH HEMI HIP ARTHROPLASTY Right  05/07/2016   Procedure: ANTERIOR APPROACH HEMI HIP ARTHROPLASTY VERSUS TOTAL HIP ARTHROPLASTY;  Surgeon: Samson FredericBrian Swinteck, MD;  Location: [facility] OR;  Service: Orthopedics;  Laterality: Right;  . CHOLECYSTECTOMY    . HIP PINNING,CANNULATED Left 12/07/2016   Procedure: CANNULATED HIP PINNING;  Surgeon: Vickki HearingStanley E Harrison, MD;  Location: [facility];  Service: Orthopedics;  Laterality: Left;    OB History    No data available       Home Medications    Prior to Admission medications   Medication Sig Start Date End Date Taking? Authorizing Provider  acetaminophen (TYLENOL) 500 MG tablet Take 1 tablet (500 mg total) by mouth every 8 (eight) hours as needed for headache. Patient taking differently: Take 1,000 mg by mouth every 8 (eight) hours.  08/02/16  Yes Arrien, York RamMauricio Daniel, MD  amLODipine (NORVASC) 10 MG tablet Take 1 tablet (10 mg total) by mouth daily. 12/10/16  Yes Penny PiaVega, [place], MD  Artificial Tear Solution (SOOTHE XP OP) Apply 1 drop to eye 2 (two) times daily.   Yes [provider]  aspirin EC 325 MG EC tablet Take 1 tablet (325 mg total) by mouth daily with breakfast. 12/11/16  Yes Penny PiaVega, [place], MD  citalopram (CELEXA) 20 MG tablet Take 20 mg by mouth daily.   Yes [provider]  feeding supplement, ENSURE ENLIVE, (ENSURE ENLIVE) LIQD Take 237 mLs by mouth 2 (two) times daily between meals. 05/09/16  Yes Ghimire, Werner Lean, MD  gabapentin (NEURONTIN) 100 MG capsule Take 1 capsule (100 mg total) by mouth at bedtime. 08/02/16  Yes Arrien, York Ram, MD  gabapentin (NEURONTIN) 300 MG capsule Take 600 mg by mouth 3 (three) times daily. 8 AM , 2 PM AND 8 PM.    Yes [provider]  levothyroxine (SYNTHROID, LEVOTHROID) 25 MCG tablet Take 25 mcg by mouth daily before breakfast.   Yes [provider]  lisinopril (PRINIVIL,ZESTRIL) 40 MG tablet Take 40 mg by mouth daily.   Yes [provider]  LORazepam (ATIVAN) 0.5 MG tablet Take 0.5 mg by mouth every 8  (eight) hours as needed for anxiety.   Yes [provider]  metFORMIN (GLUCOPHAGE) 500 MG tablet Take 250 mg by mouth 2 (two) times daily with a meal.   Yes [provider]  metoprolol tartrate (LOPRESSOR) 25 MG tablet Take 25 mg by mouth 2 (two) times daily.   Yes [provider]  phenytoin (DILANTIN) 300 MG ER capsule Take 1 capsule (300 mg total) by mouth at bedtime. 08/02/16  Yes Arrien, York Ram, MD  levofloxacin (LEVAQUIN) 500 MG tablet Take 1 tablet (500 mg total) by mouth daily. Patient not taking: Reported on 06/12/2017 03/06/17   Azalia Bilis, MD  LORazepam (ATIVAN) 2 MG/ML concentrated solution Take 0.3 mLs (0.6 mg total) by mouth 2 (two) times daily. Patient not taking: Reported on 06/12/2017 12/10/16   Penny Pia, MD  Memantine HCl-Donepezil HCl [facility]) 28-10 MG CP24 Take 1 tablet by mouth daily.    [provider]  traMADol (ULTRAM) 50 MG tablet Take 1 tablet (50 mg total) by mouth every 6 (six) hours as needed. 06/12/17   Raeford Razor, MD    Family History Family History  Problem Relation Age of Onset  . Hypertension Other     Social History Social History  Substance Use Topics  . Smoking status: Former Games developer  . Smokeless tobacco: Former Neurosurgeon  . Alcohol use No     Allergies   Penicillins; Promethazine hcl; and Sulfonamide derivatives   Review of Systems Review of Systems  Level 5 caveat because of dementia.   Physical Exam Updated Vital Signs BP (!) 150/74 (BP Location: Left Arm)   Pulse 61   Temp 98 F (36.7 C) (Oral)   Resp 18   Ht 5\' 6"  (1.676 m)   Wt 59 kg (130 lb)   SpO2 98%   BMI 20.98 kg/m   Physical Exam  Constitutional: She appears well-developed and well-nourished. No distress.  HENT:  Head: Normocephalic.  Superficial abrasion to forehead. No apparent facial or midline spinal tenderness.   Eyes: Conjunctivae are normal. Pupils are equal, round, and reactive to light. Right eye exhibits no  discharge. Left eye exhibits no discharge.  Neck: Neck supple.  Cardiovascular: Normal rate, regular rhythm and normal heart sounds.  Exam reveals no gallop and no friction rub.   No murmur heard. Pulmonary/Chest: Effort normal and breath sounds normal. No respiratory distress.  Abdominal: Soft. She exhibits no distension. There is no tenderness.  Musculoskeletal: She exhibits no edema or tenderness.  Extensive bruising to R shoulder. Can range although seems to be in pain. Reacts most with abduction. NVI. Possibly some reaction with ranging R hip.   Neurological: She is alert.  Skin: Skin is warm and dry.  Nursing note and vitals reviewed.    ED Treatments / Results  Labs (all labs ordered are listed, but only abnormal results are  displayed) Labs Reviewed  URINALYSIS, ROUTINE W REFLEX MICROSCOPIC - Abnormal; Notable for the following:       Result Value   Glucose, UA >=500 (*)    Hgb urine dipstick SMALL (*)    Protein, ur 30 (*)    Squamous Epithelial / LPF 0-5 (*)    All other components within normal limits  CBG MONITORING, ED - Abnormal; Notable for the following:    Glucose-Capillary 111 (*)    All other components within normal limits    EKG  EKG Interpretation None       Radiology No results found.   Dg Shoulder Right  Result Date: 06/12/2017 CLINICAL DATA:  Larey Seat from wheelchair. Soft tissue injury with bruising. EXAM: RIGHT SHOULDER - 2+ VIEW COMPARISON:  08/08/2016 FINDINGS: Humeral head remains normally located. Chronic osteoarthritis of the glenohumeral joint. Old healed proximal humeral fracture. No evidence of acute fracture of the humerus. Suspicion of acute fracture of the distal clavicle which is slightly buckled. There may be a small displaced fragment at the distal superior corner. IMPRESSION: Suspicion of buckle fracture of the distal clavicle and small avulsion fracture of the distal upper cortex. Electronically Signed   By: Paulina Fusi M.D.   On:  06/12/2017 13:37   Ct Head Wo Contrast  Result Date: 06/12/2017 CLINICAL DATA:  Fall out of wheelchair, history of syncope EXAM: CT HEAD WITHOUT CONTRAST CT CERVICAL SPINE WITHOUT CONTRAST TECHNIQUE: Multidetector CT imaging of the head and cervical spine was performed following the standard protocol without intravenous contrast. Multiplanar CT image reconstructions of the cervical spine were also generated. COMPARISON:  09/06/2016 FINDINGS: CT HEAD FINDINGS Brain: No evidence of acute infarction, hemorrhage, extra-axial collection or mass lesion/mass effect. Global cortical and central atrophy.  Secondary ventriculomegaly. Subcortical white matter and periventricular small vessel ischemic changes. Vascular: Mild intracranial atherosclerosis. Skull: Normal. Negative for fracture or focal lesion. Sinuses/Orbits: The visualized paranasal sinuses are essentially clear. The mastoid air cells are unopacified. Other: None. CT CERVICAL SPINE FINDINGS Alignment: Normal cervical lordosis. Skull base and vertebrae: No acute fracture. No primary bone lesion or focal pathologic process. Soft tissues and spinal canal: No prevertebral fluid or swelling. No visible canal hematoma. Disc levels: Vertebral body heights and intervertebral disc spaces are maintained. Mild degenerative changes C5-6. Spinal canal is patent. Upper chest: Visualized lung apices are essentially clear. Other: Visualized thyroid is unremarkable. IMPRESSION: No evidence of acute intracranial abnormality. Atrophy with small vessel ischemic changes. No evidence of traumatic injury to the cervical spine. Electronically Signed   By: Charline Bills M.D.   On: 06/12/2017 13:48   Ct Cervical Spine Wo Contrast  Result Date: 06/12/2017 CLINICAL DATA:  Fall out of wheelchair, history of syncope EXAM: CT HEAD WITHOUT CONTRAST CT CERVICAL SPINE WITHOUT CONTRAST TECHNIQUE: Multidetector CT imaging of the head and cervical spine was performed following the  standard protocol without intravenous contrast. Multiplanar CT image reconstructions of the cervical spine were also generated. COMPARISON:  09/06/2016 FINDINGS: CT HEAD FINDINGS Brain: No evidence of acute infarction, hemorrhage, extra-axial collection or mass lesion/mass effect. Global cortical and central atrophy.  Secondary ventriculomegaly. Subcortical white matter and periventricular small vessel ischemic changes. Vascular: Mild intracranial atherosclerosis. Skull: Normal. Negative for fracture or focal lesion. Sinuses/Orbits: The visualized paranasal sinuses are essentially clear. The mastoid air cells are unopacified. Other: None. CT CERVICAL SPINE FINDINGS Alignment: Normal cervical lordosis. Skull base and vertebrae: No acute fracture. No primary bone lesion or focal pathologic process. Soft tissues and spinal  canal: No prevertebral fluid or swelling. No visible canal hematoma. Disc levels: Vertebral body heights and intervertebral disc spaces are maintained. Mild degenerative changes C5-6. Spinal canal is patent. Upper chest: Visualized lung apices are essentially clear. Other: Visualized thyroid is unremarkable. IMPRESSION: No evidence of acute intracranial abnormality. Atrophy with small vessel ischemic changes. No evidence of traumatic injury to the cervical spine. Electronically Signed   By: Charline Bills M.D.   On: 06/12/2017 13:48   Dg Hip Unilat W Or W/o Pelvis 2-3 Views Left  Result Date: 05/31/2017 CLINICAL DATA:  Prior left hip fracture.  Follow-up exam . EXAM: DG HIP (WITH OR WITHOUT PELVIS) 2-3V LEFT COMPARISON:  02/27/2017 01/14/2017 . FINDINGS: Prior lumbar fusion. Prior right hip replacement. Prior ORIF left hip. Hardware intact. Anatomic alignment. Site of left hip fractures stable. Aortoiliac and peripheral atherosclerotic vascular disease. IMPRESSION: 1. Prior lumbar spine fusion, right hip replacement, ORIF left hip. Bony structures appear stable. 2. Aortoiliac and peripheral  vascular disease . Electronically Signed   By: Maisie Fus  Register   On: 05/31/2017 15:15   Dg Hip Unilat W Or Wo Pelvis 2-3 Views Right  Result Date: 06/12/2017 CLINICAL DATA:  Larey Seat out of wheelchair today.  Right hip pain EXAM: DG HIP (WITH OR WITHOUT PELVIS) 2-3V RIGHT COMPARISON:  05/31/2017 FINDINGS: Five pole or arthroplasty of the right hip has a good appearance. No evidence of injury. No other right pelvic fracture. Old treatment of left femoral neck fracture. IMPRESSION: No acute finding. Bipolar right hip replacement. Previously treated left femoral neck fracture. Electronically Signed   By: Paulina Fusi M.D.   On: 06/12/2017 13:35   Procedures Procedures (including critical care time)  Medications Ordered in ED Medications - No data to display   Initial Impression / Assessment and Plan / ED Course  I have reviewed the triage vital signs and the nursing notes.  Pertinent labs & imaging results that were available during my care of the patient were reviewed by me and considered in my medical decision making (see chart for details).     81yF with fall from seated position. Reportedly at her baselien mental status. Plain films with distal clavicle fx, otherwise imaging ok. Closed injury. Sling. PRN pain meds. Ortho FU.   Final Clinical Impressions(s) / ED Diagnoses   Final diagnoses:  Closed nondisplaced fracture of acromial end of right clavicle, initial encounter  Fall, initial encounter    New Prescriptions Discharge Medication List as of 06/12/2017  3:28 PM    START taking these medications   Details  traMADol (ULTRAM) 50 MG tablet Take 1 tablet (50 mg total) by mouth every 6 (six) hours as needed., Starting Wed 06/12/2017, Print         Raeford Razor, MD 06/24/17 1216

## 2017-08-01 IMAGING — CT CT HEAD W/O CM
1 series · 16 of 30 positions shown, 20 images · non-contrast
Comparison: 08/29/2015

CLINICAL DATA: Dizziness.  Found on floor at [HOSPITAL]

EXAM:
CT HEAD WITHOUT CONTRAST
TECHNIQUE: Contiguous axial images were obtained from the base of the skull
through the vertex without intravenous contrast.

[Series 2: headseq 4.8 h37s · axial · 0.43mm/px · z∈[+108,+260]mm · 16 of 36 slices shown, 20 images]
[im 2/36  brain]
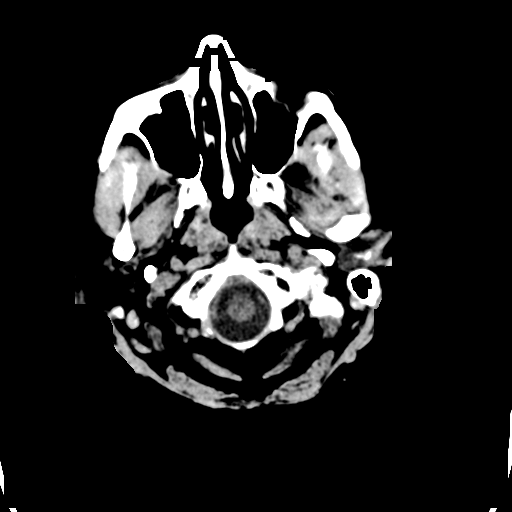
[im 2/36  bone]
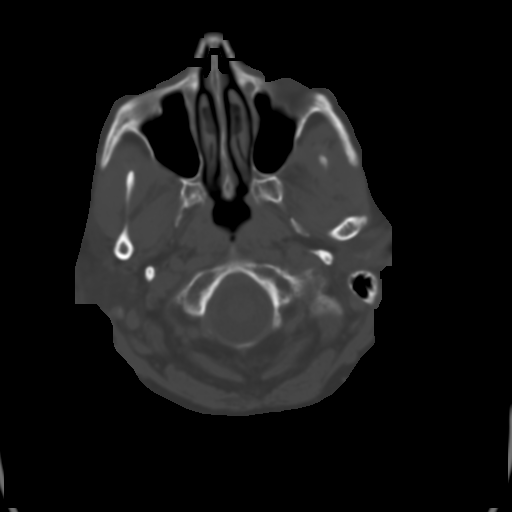
[im 4/36  brain]
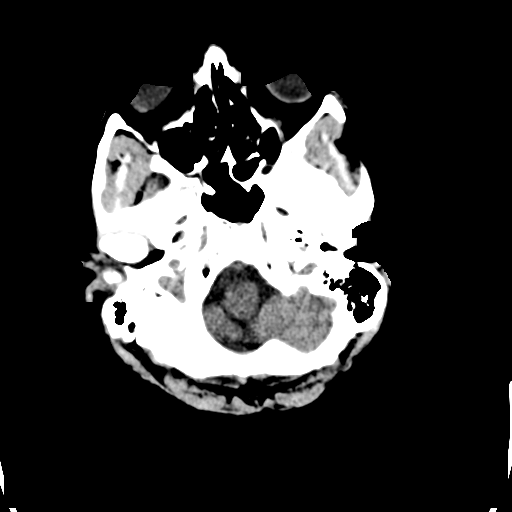
[im 7/36  brain]
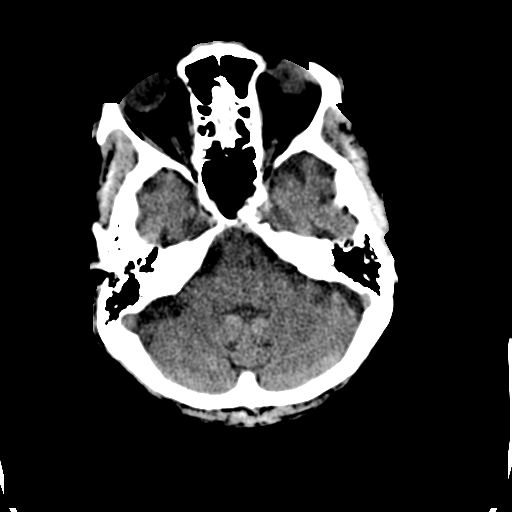
[im 9/36  brain]
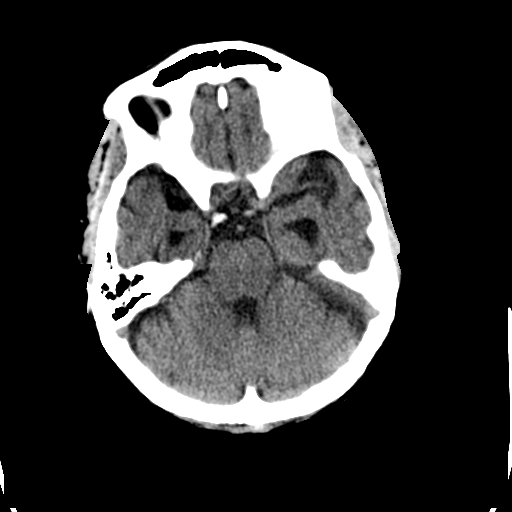
[im 10/36  brain]
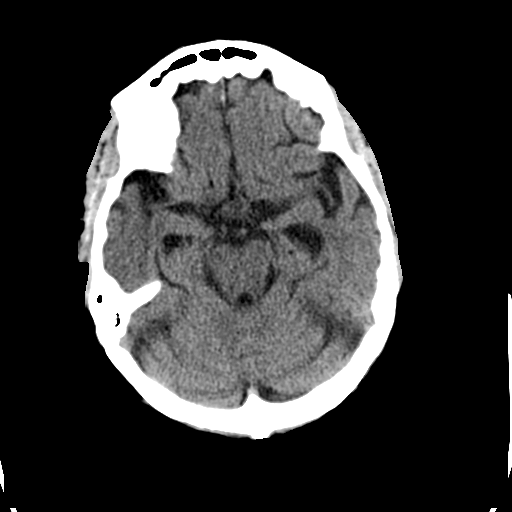
[im 10/36  bone]
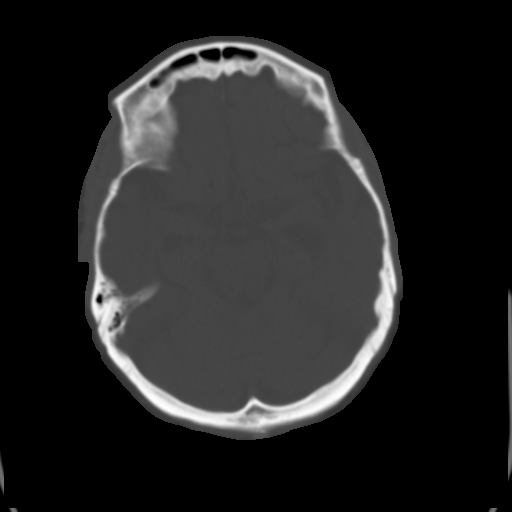
[im 13/36  brain]
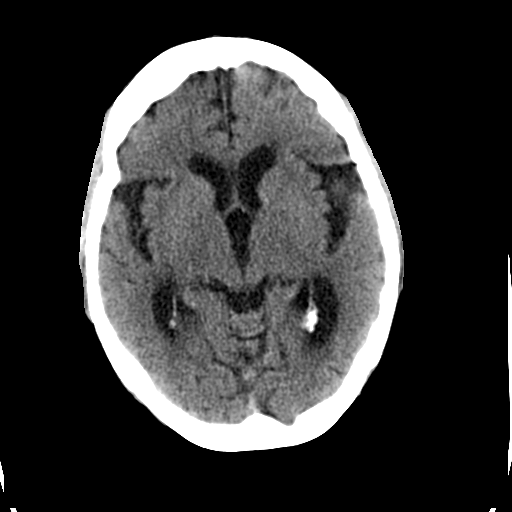
[im 15/36  brain]
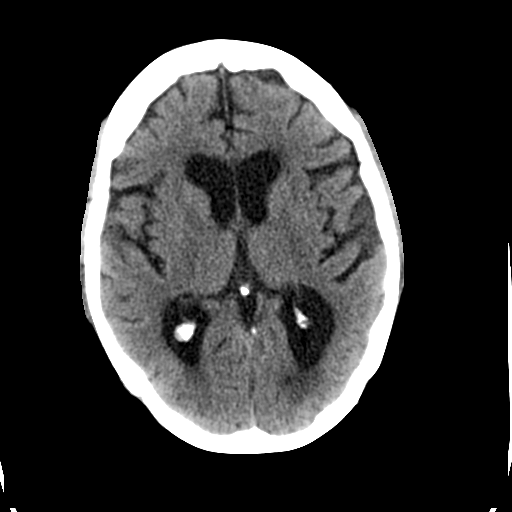
[im 17/36  brain]
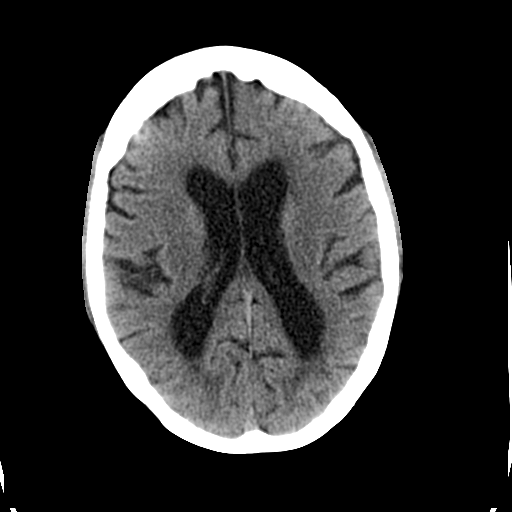
[im 19/36  brain]
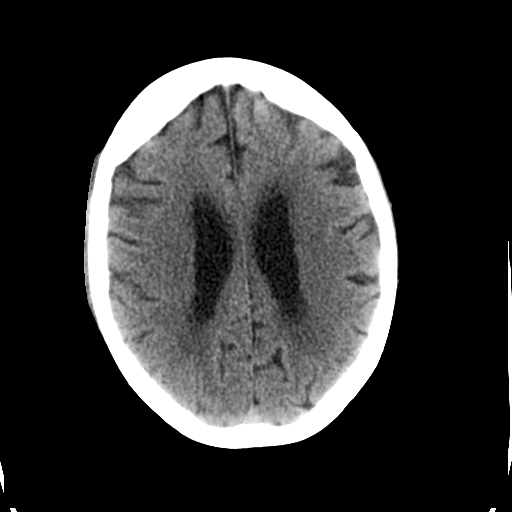
[im 19/36  bone]
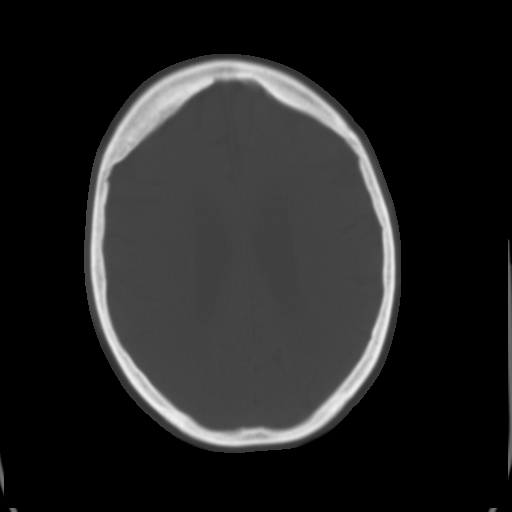
[im 21/36  brain]
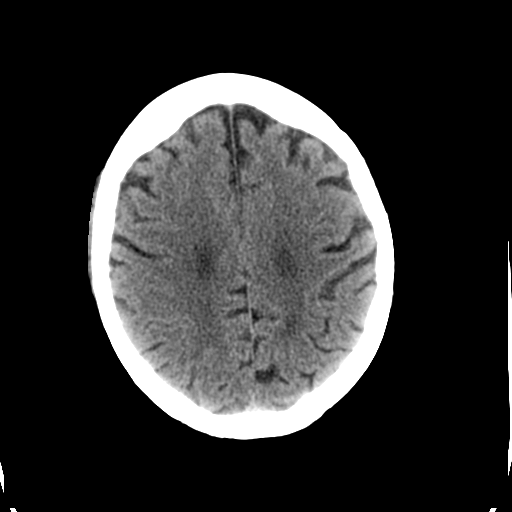
[im 23/36  brain]
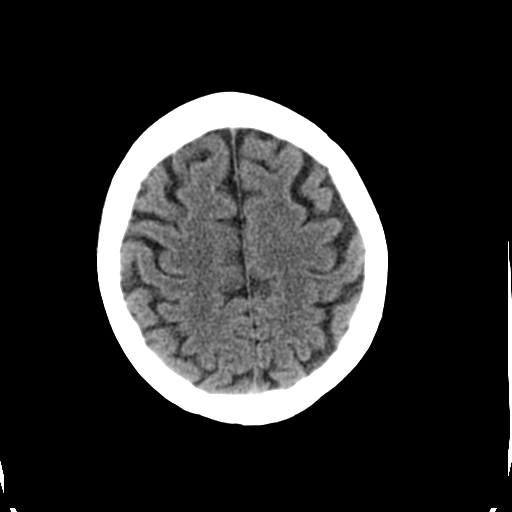
[im 26/36  brain]
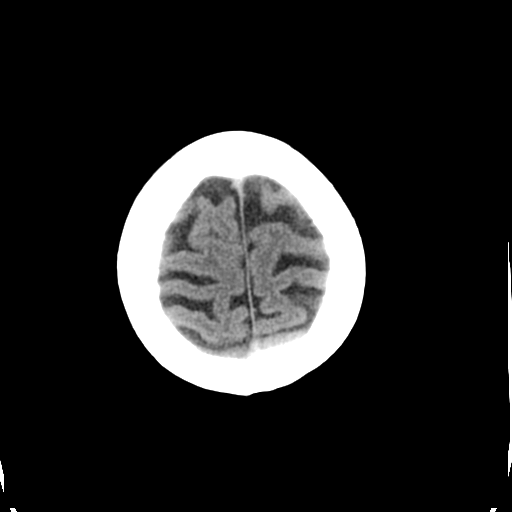
[im 27/36  brain]
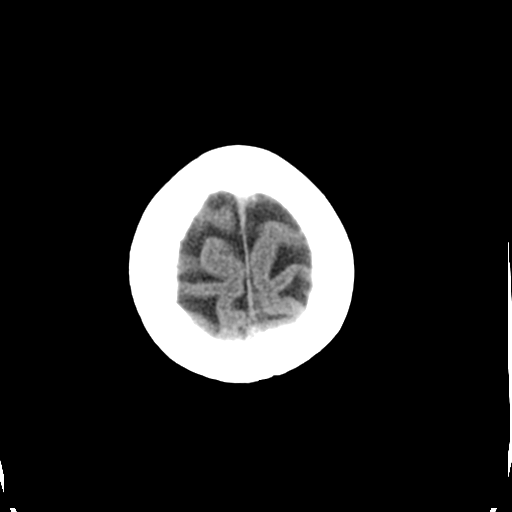
[im 27/36  bone]
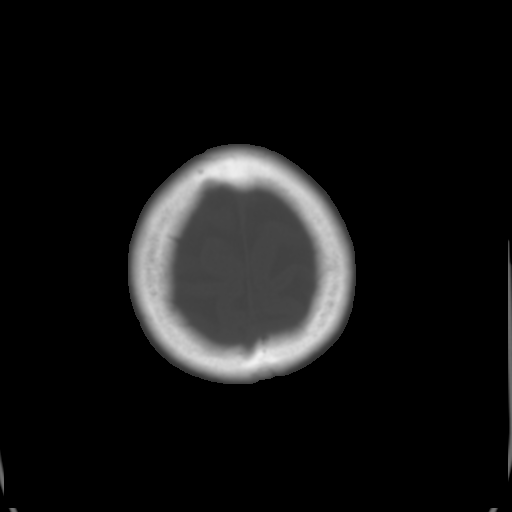
[im 29/36  brain]
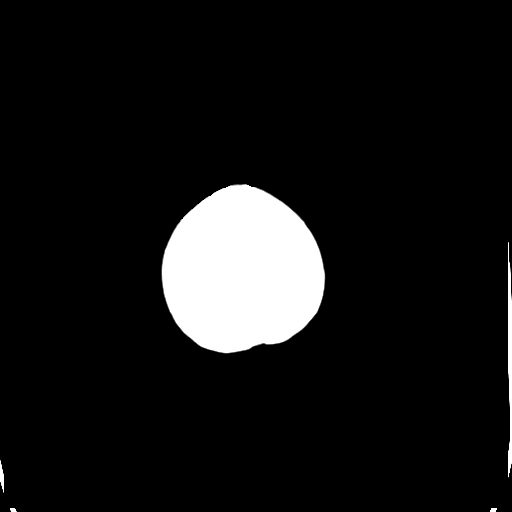
[im 32/36  brain]
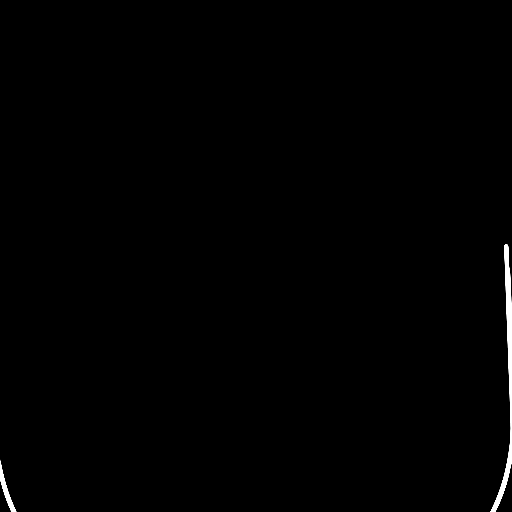
[im 34/36  brain]
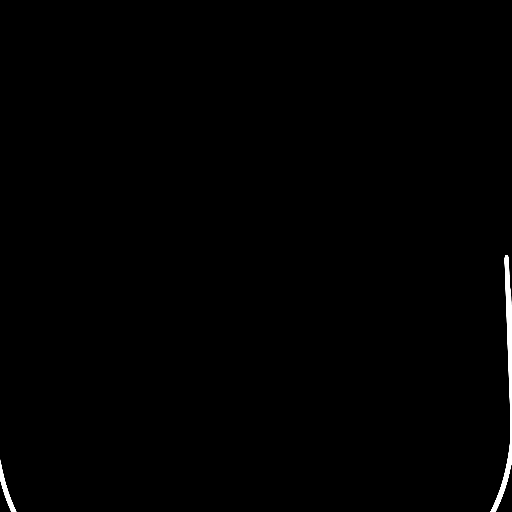

[16 of 30 positions shown; findings below may reference images not displayed]

FINDINGS: Skull and Sinuses:Negative for fracture or destructive process. The
visualized mastoids, middle ears, and imaged paranasal sinuses are
clear.

Orbits: Bilateral cataract resection.  No acute finding.

Brain: No evidence of acute infarction, hemorrhage, hydrocephalus,
or mass lesion/mass effect.

Age congruent chronic small vessel disease with ischemic gliosis
mainly seen around the lateral ventricles. Volume loss with mild to
moderate medial temporal involvement, implicating Alzheimer's
disease in this patient with history of dementia.
IMPRESSION: No acute or traumatic finding.

## 2017-12-31 DEATH — deceased
